# Patient Record
Sex: Female | Born: 1965 | Race: White | Hispanic: No | Marital: Married | Smoking: Never smoker
Health system: Southern US, Community
[De-identification: ages and names within clinical notes are randomized; demographics above are authoritative.]

## PROBLEM LIST (undated history)

## (undated) DIAGNOSIS — N939 Abnormal uterine and vaginal bleeding, unspecified: Secondary | ICD-10-CM

## (undated) DIAGNOSIS — E7800 Pure hypercholesterolemia, unspecified: Secondary | ICD-10-CM

## (undated) DIAGNOSIS — E1110 Type 2 diabetes mellitus with ketoacidosis without coma: Secondary | ICD-10-CM

## (undated) DIAGNOSIS — E669 Obesity, unspecified: Secondary | ICD-10-CM

## (undated) HISTORY — PX: OTHER SURGICAL HISTORY: SHX169

## (undated) HISTORY — DX: Abnormal uterine and vaginal bleeding, unspecified: N93.9

## (undated) HISTORY — DX: Obesity, unspecified: E66.9

## (undated) HISTORY — DX: Pure hypercholesterolemia, unspecified: E78.00

## (undated) HISTORY — DX: Type 2 diabetes mellitus with ketoacidosis without coma: E11.10

## (undated) HISTORY — PX: TYMPANOSTOMY TUBE PLACEMENT: SHX32

---

## 1998-05-04 ENCOUNTER — Inpatient Hospital Stay (HOSPITAL_COMMUNITY): Admission: AD | Admit: 1998-05-04 | Discharge: 1998-05-04 | Payer: Self-pay | Admitting: Obstetrics and Gynecology

## 1998-05-07 ENCOUNTER — Ambulatory Visit (HOSPITAL_COMMUNITY): Admission: RE | Admit: 1998-05-07 | Discharge: 1998-05-07 | Payer: Self-pay | Admitting: Obstetrics and Gynecology

## 1998-09-18 ENCOUNTER — Other Ambulatory Visit: Admission: RE | Admit: 1998-09-18 | Discharge: 1998-09-18 | Payer: Self-pay | Admitting: Obstetrics and Gynecology

## 2004-02-24 ENCOUNTER — Ambulatory Visit: Payer: Self-pay | Admitting: General Practice

## 2004-02-25 ENCOUNTER — Ambulatory Visit: Payer: Self-pay | Admitting: Unknown Physician Specialty

## 2004-03-03 ENCOUNTER — Ambulatory Visit: Payer: Self-pay | Admitting: Unknown Physician Specialty

## 2004-03-26 ENCOUNTER — Ambulatory Visit: Payer: Self-pay | Admitting: Unknown Physician Specialty

## 2004-08-23 ENCOUNTER — Ambulatory Visit: Payer: Self-pay | Admitting: Gynecology

## 2004-08-31 ENCOUNTER — Ambulatory Visit: Payer: Self-pay | Admitting: Gynecology

## 2004-09-23 ENCOUNTER — Ambulatory Visit (HOSPITAL_COMMUNITY): Admission: RE | Admit: 2004-09-23 | Discharge: 2004-09-23 | Payer: Self-pay | Admitting: Gynecology

## 2004-11-03 ENCOUNTER — Ambulatory Visit (HOSPITAL_COMMUNITY): Admission: RE | Admit: 2004-11-03 | Discharge: 2004-11-03 | Payer: Self-pay | Admitting: Gynecology

## 2005-01-17 ENCOUNTER — Ambulatory Visit (HOSPITAL_COMMUNITY): Admission: RE | Admit: 2005-01-17 | Discharge: 2005-01-17 | Payer: Self-pay | Admitting: Gynecology

## 2005-02-07 ENCOUNTER — Ambulatory Visit (HOSPITAL_COMMUNITY): Admission: RE | Admit: 2005-02-07 | Discharge: 2005-02-07 | Payer: Self-pay | Admitting: Gynecology

## 2005-03-05 ENCOUNTER — Encounter (INDEPENDENT_AMBULATORY_CARE_PROVIDER_SITE_OTHER): Payer: Self-pay | Admitting: Specialist

## 2005-03-05 ENCOUNTER — Inpatient Hospital Stay (HOSPITAL_COMMUNITY): Admission: AD | Admit: 2005-03-05 | Discharge: 2005-03-08 | Payer: Self-pay | Admitting: Gynecology

## 2005-03-09 ENCOUNTER — Encounter: Admission: RE | Admit: 2005-03-09 | Discharge: 2005-04-05 | Payer: Self-pay | Admitting: Gynecology

## 2005-04-18 ENCOUNTER — Ambulatory Visit: Payer: Self-pay | Admitting: Gynecology

## 2006-02-28 ENCOUNTER — Ambulatory Visit: Payer: Self-pay | Admitting: General Practice

## 2006-06-12 ENCOUNTER — Ambulatory Visit: Payer: Self-pay | Admitting: Gynecology

## 2006-06-12 ENCOUNTER — Encounter (INDEPENDENT_AMBULATORY_CARE_PROVIDER_SITE_OTHER): Payer: Self-pay | Admitting: Gynecology

## 2007-02-28 ENCOUNTER — Ambulatory Visit: Payer: Self-pay | Admitting: General Practice

## 2008-01-17 ENCOUNTER — Encounter: Payer: Self-pay | Admitting: Family Medicine

## 2008-01-17 ENCOUNTER — Ambulatory Visit: Payer: Self-pay | Admitting: Family Medicine

## 2008-03-18 ENCOUNTER — Ambulatory Visit: Payer: Self-pay | Admitting: General Practice

## 2008-09-16 ENCOUNTER — Ambulatory Visit: Payer: Self-pay | Admitting: General Practice

## 2009-03-04 ENCOUNTER — Ambulatory Visit: Payer: Self-pay | Admitting: Family Medicine

## 2009-03-23 ENCOUNTER — Ambulatory Visit: Payer: Self-pay | Admitting: General Practice

## 2009-10-16 ENCOUNTER — Ambulatory Visit: Payer: Self-pay | Admitting: General Practice

## 2009-11-07 ENCOUNTER — Emergency Department: Payer: Self-pay | Admitting: Emergency Medicine

## 2010-01-20 ENCOUNTER — Ambulatory Visit: Payer: Self-pay | Admitting: Family Medicine

## 2010-03-30 ENCOUNTER — Ambulatory Visit: Payer: Self-pay | Admitting: Unknown Physician Specialty

## 2010-06-15 NOTE — Assessment & Plan Note (Signed)
[name], Carr                ACCOUNT NO.:  [id number]   MEDICAL RECORD NO.:  [id number]          PATIENT TYPE:  POB   LOCATION:  [facility] at [facility]         FACILITY:  [facility]   PHYSICIAN:  Tinnie Gens, MD        DATE OF BIRTH:  [birth year]   DATE OF SERVICE:  03/04/2009                                  CLINIC NOTE   CHIEF COMPLAINT:  Yearly exam.   HISTORY OF PRESENT ILLNESS:  The patient is a 45 year old para 3 who is  a type 2 diabetic.  She was on insulin, Lantus and Humalog and has been  doing Weight Watchers for the past several weeks.  She has lost some  weight and has had a decrease in her insulin.  Otherwise, she is without  significant complaint today.  She uses Sprintec for birth control, this  worked well.  Periods are normal with normal flow.   PAST MEDICAL HISTORY:  Diabetes and obesity.   PAST SURGICAL HISTORY:  C-section x3 and tubes in her ears as a young  child.   MEDICATIONS:  She is on Lantus and Humalog, metformin 500 mg p.o. b.i.d.  and Ortho-Cyclen one p.o. daily.   ALLERGIES:  AUGMENTIN.   OBSTETRICAL HISTORY:  Para 3-0-3.  Previous C-sections.   GYNECOLOGIC HISTORY:  Negative.   FAMILY HISTORY:  Her mother deceased at age 11 of ovarian cancer, stage  IV.  Maternal grandmother has breast cancer, a maternal aunt recently  deceased of breast cancer which she battled for some 20 years.   SOCIAL HISTORY:  She works as an Print production planner for [employer].   REVIEW OF SYSTEMS:  Reviewed.  She denied chest pain, shortness of  breath, fever, chills, nausea, vomiting, diarrhea, constipation, trouble  passing her urine, trouble passing her stools.  She denies blood in her  stool.  She does not do regular breast exams but has not noted any  masses there.   PHYSICAL EXAMINATION:  VITAL SIGNS:  As noted in the chart.  Weight is  132 down at 9 pounds from last visit, pulse is 68, blood pressure  119/85.  GENERAL:  She is a mildly obese  female, in no acute distress.  HEENT:  Normocephalic, atraumatic.  Sclerae anicteric.  NECK:  Supple.  Normal thyroid.  LUNGS:  Clear bilaterally.  CV:  Regular rate and rhythm.  No rubs, gallops, or murmurs.  ABDOMEN:  Soft, nontender, nondistended.  EXTREMITIES:  No cyanosis, clubbing, or edema.  BREASTS:  Symmetric with everted nipples.  No masses.  No  supraclavicular or axillary adenopathy.  GU:  Normal external female genitalia.  BUS normal.  Vagina is pink and  rugated.  Cervix is parous without lesion.  Uterus is small, anteverted.  No adnexal mass or tenderness.   IMPRESSION:  1. Yearly exam.  2. Diabetes.  3. Obesity, improving.  4. A strong family history of breast and ovarian cancer.   PLAN:  1. We will attempt BRCA testing.  2. Pap smear today.  3. Mammogram scheduled next week, get her worked.  4. I have advised exercise and bedtime snacks to prevent low sugars  and exercise to improve weight loss.           ______________________________  Tinnie Gens, MD     TP/MEDQ  D:  03/04/2009  T:  03/05/2009  Job:  [id number]

## 2010-06-15 NOTE — Assessment & Plan Note (Signed)
Lori Carr, Lori Carr                ACCOUNT NO.:  [id number]   MEDICAL RECORD NO.:  [id number]          PATIENT TYPE:  POB   LOCATION:  [facility] at [facility]         FACILITY:  [facility]   PHYSICIAN:  Tinnie Gens, MD        DATE OF BIRTH:  1965-06-08   DATE OF SERVICE:                                  CLINIC NOTE   CHIEF COMPLAINT:  Yearly exam.   HISTORY OF PRESENT ILLNESS:  The patient is a 45 year old para 3 who  comes in today for yearly exam.  She is a type 2 diabetic and has been  since the delivery of her last baby.  She has been unable to get off  insulin since that time, sees Dr. Hyacinth Meeker for this problem who checks her  cholesterol and eye exams, and monitors her blood sugar.  She is on oral  contraceptives for birth control specifically Sprintec which works well  for her.  Her periods are regular and are of low flow.   PAST MEDICAL HISTORY:  Diabetes, obesity.   PAST SURGICAL HISTORY:  C-section x3.  She had tubes in her ears as a  young child.   MEDICATIONS:  She is on insulin subcu, metformin 500 mg b.i.d., Ortho-  Cyclen one p.o. daily.   ALLERGIES:  AUGMENTIN.   OBSTETRICAL HISTORY:  She is a para 3, with 3 previous C-sections.   GYNECOLOGIC HISTORY:  No significant GYN history is noted.   FAMILY HISTORY:  Strong family history of cancer in her mother's side of  the family.  Her mother was actually deceased from ovarian cancer, stage  IV.  She does have strong family history of breast cancer and some other  things.   SOCIAL HISTORY:  She works as an Print production planner.   REVIEW OF SYSTEMS:  Reviewed.  She denies chest pain, shortness of  breath, dysuria, headache, fever, chills, nausea, vomiting, diarrhea,  constipation.  She does have an itchy bottom from time to time, which  she attributes to hemorrhoids.  She denies any breast complaints or  pelvic complaints.   PHYSICAL EXAMINATION:  VITAL SIGNS:  Today, her vitals are as noted in  the chart.  GENERAL:  She is a  well-developed, well-nourished female in no acute  distress.  HEENT:  Normocephalic, atraumatic.  Sclerae anicteric.  NECK:  Supple.  Normal thyroid.  LUNGS:  Clear bilaterally.  CARDIOVASCULAR:  Regular rate and rhythm.  No rubs, gallops, or murmurs.  ABDOMEN:  Soft, nontender, nondistended.  EXTREMITIES:  No cyanosis, clubbing, or edema.  BREASTS:  Symmetric with everted nipples.  No masses.  No  supraclavicular or axillary adenopathy noted.  GENITOURINARY:  Normal external female genitalia.  BUS normal.  Vagina  is pink and rugated.  Cervix is nulliparous without lesion.  Uterus is  small, anteverted.  No adnexal mass or tenderness.   IMPRESSION:  1. GYN exam with Pap.  2. Strong family history of breast and ovarian cancer.   PLAN:  1. Pap smear today.  2. Yearly labs to be done at her PCP office.  3. We will mail the patient information about the RCA testing, which  she will look over and decide if would like to be tested for this.           ______________________________  Tinnie Gens, MD     TP/MEDQ  D:  01/17/2008  T:  01/18/2008  Job:  [id number]

## 2010-06-18 NOTE — Op Note (Signed)
NAMEKATHERIN, Lori Carr                ACCOUNT NO.:  [id number]   MEDICAL RECORD NO.:  [id number]          PATIENT TYPE:  INP   LOCATION:  9319                          FACILITY:  [facility]   PHYSICIAN:  Ginger Carne, MD  DATE OF BIRTH:  07-Aug-1965   DATE OF PROCEDURE:  03/05/2005  DATE OF DISCHARGE:                                 OPERATIVE REPORT   PREOPERATIVE DIAGNOSES:  1.  Type 1 insulin-dependent diabetes mellitus at 35-6/7 weeks' gestation.  2.  Repeat cesarean section in active labor with preterm premature rupture      of membranes.   POSTOPERATIVE DIAGNOSES:  1.  Type 1 insulin-dependent diabetes mellitus at 35-6/7 weeks' gestation.  2.  Repeat cesarean section in active labor with preterm premature rupture      of membranes.  3.  Preterm viable delivery of female infant.   PROCEDURE:  Repeat low transverse cesarean section.   SURGEON:  Dr. Mia Creek   ASSISTANT:  None.   ANESTHESIA:  Spinal.   COMPLICATIONS:  None immediate.   ESTIMATED BLOOD LOSS:  650 mL.   SPECIMEN:  Cord bloods and placenta.   OPERATIVE FINDINGS:  Preterm infant female delivered in the early morning of  March 05, 2005, Apgars and weight per delivery room record, no gross  abnormalities, baby cried spontaneously at delivery.  Copious amniotic fluid  was present, clear and compatible with preoperative assessment of  polyhydramnios.  The fetus in a vertex presentation, three-vessel cord,  central insertion, placenta complete.  Uterus demonstrated adhesive disease,  both laterally as well as superiorly on the anterior surface.  Uterus,  tubes, and ovaries showed normal decidual changes of pregnancy.   OPERATIVE PROCEDURE:  The patient prepped in the usual fashion and placed in  the left lateral supine position.  Betadine solution used for antiseptic,  and the patient was catheterized prior to the procedure.  After adequate  spinal analgesia, a Pfannenstiel incision was made and the abdomen  opened.  Lower uterine segment incised transversely, baby delivered, the cord clamped  and cut, and infant given to the pediatric staff after bulb suctioning.  Placenta removed manually, uterus inspected, closed the uterine musculature  in 1 layer with 0 Vicryl running interlocking suture.  Bleeding points  hemostatically checked, the blood clots removed from the abdomen.  Closure  of the parietal  peritoneum was not performed due to insufficient tissue.  The fascia was  closed with 0 Vicryl running interlocking suture from either end to midline  and 4-0 Prolene for a subcuticular closure with a pressure dressing.  The  patient tolerated the procedure well, returned to postanesthesia recovery  room in excellent condition.      Ginger Carne, MD  Electronically Signed     SHB/MEDQ  D:  03/05/2005  T:  03/05/2005  Job:  [id number]

## 2010-06-18 NOTE — Discharge Summary (Signed)
[name], Lori Carr                ACCOUNT NO.:  [id number]   MEDICAL RECORD NO.:  [id number]          PATIENT TYPE:  INP   LOCATION:  9319                          FACILITY:  [facility]   PHYSICIAN:  Ginger Carne, MD  DATE OF BIRTH:  11/12/1965   DATE OF ADMISSION:  03/05/2005  DATE OF DISCHARGE:  03/08/2005                                 DISCHARGE SUMMARY   REASON FOR HOSPITALIZATION:  Type 1 insulin dependent diabetes mellitus at  35-6/7 weeks with history of repeat cesarean section presenting in active  labor and preterm premature rupture of membranes.   IN-HOSPITAL PROCEDURE:  Repeat low transverse cesarean section.   FINAL DIAGNOSIS:  1.  Type 1 insulin dependent diabetes mellitus at 35-6/7 weeks with history      of repeat cesarean section presenting in active labor and preterm      premature rupture of membranes.  2.  Preterm viable delivery of female infant.   HOSPITAL COURSE:  The patient is a 45 year old multiparous Caucasian female  with type 1 diabetes mellitus presenting at 35-6/7 weeks' gestation with  preterm premature rupture of membranes in early morning of March 05, 2005.  The patient had a history of polyhydramnios prior to her admission.  Her  antenatal course was otherwise unremarkable.  She had been on three time a  day dose of insulin.  The patient underwent primary cesarean section on  March 05, 2005.  Intraoperative course was unremarkable.  Postoperatively  she did well. She was afebrile, voided well.  Incisions dry.  Calves without  tenderness.  Abdomen was soft. Postoperative H&H was 31.6 and 10.9.  The  infant weighed 3630 grams (8 pounds 0 ounces) with Apgars of 8 and 9.   The patient was discharged with routine postoperative instructions including  contacting the office for temperature elevation above 100.4 degrees  Fahrenheit, increasing incisional pain, drainage, abdominal pain, increased  vaginal bleeding, genitourinary or gastrointestinal  symptoms.  The patient  was advised to decrease her insulin by 1/4 both the long-acting and for  short-acting doses.  The patient's blood sugars have decreased over her  hospitalization stay.  She was prescribed ibuprofen 600 mg one up to four  times a day.  She declined narcotic analgesics.  She will be seen in three  days to have her subcuticular suture removal and then in 4-6 weeks for a  postoperative visit.      Ginger Carne, MD  Electronically Signed     SHB/MEDQ  D:  03/08/2005  T:  03/08/2005  Job:  [id number]

## 2010-09-06 ENCOUNTER — Encounter: Payer: Self-pay | Admitting: *Deleted

## 2010-09-06 DIAGNOSIS — E119 Type 2 diabetes mellitus without complications: Secondary | ICD-10-CM | POA: Insufficient documentation

## 2010-09-07 ENCOUNTER — Ambulatory Visit: Payer: Self-pay | Admitting: Obstetrics and Gynecology

## 2010-09-08 ENCOUNTER — Other Ambulatory Visit: Payer: Self-pay | Admitting: *Deleted

## 2010-09-08 DIAGNOSIS — Z309 Encounter for contraceptive management, unspecified: Secondary | ICD-10-CM

## 2010-09-08 MED ORDER — NORGESTIMATE-ETH ESTRADIOL 0.25-35 MG-MCG PO TABS: 1.0000 | ORAL_TABLET | Freq: Every day | ORAL | Status: DC

## 2010-09-08 NOTE — Telephone Encounter (Signed)
Patient has appointment in two weeks but will run out of her ocp before her appointment.  One refill authorized.

## 2010-09-21 ENCOUNTER — Ambulatory Visit (INDEPENDENT_AMBULATORY_CARE_PROVIDER_SITE_OTHER): Payer: BC Managed Care – PPO | Admitting: Obstetrics & Gynecology

## 2010-09-21 ENCOUNTER — Encounter: Payer: Self-pay | Admitting: Obstetrics & Gynecology

## 2010-09-21 VITALS — BP 118/86 | HR 83 | Ht 62.0 in | Wt 184.0 lb

## 2010-09-21 DIAGNOSIS — Z309 Encounter for contraceptive management, unspecified: Secondary | ICD-10-CM

## 2010-09-21 DIAGNOSIS — Z1272 Encounter for screening for malignant neoplasm of vagina: Secondary | ICD-10-CM

## 2010-09-21 DIAGNOSIS — Z01419 Encounter for gynecological examination (general) (routine) without abnormal findings: Secondary | ICD-10-CM

## 2010-09-21 MED ORDER — NORGESTIMATE-ETH ESTRADIOL 0.25-35 MG-MCG PO TABS: 1.0000 | ORAL_TABLET | Freq: Every day | ORAL | Status: DC

## 2010-09-21 NOTE — Progress Notes (Signed)
Yearly exam/ patient is concerned about her increasing weight.

## 2010-09-21 NOTE — Progress Notes (Signed)
  Subjective:     Lori Carr is a 45 y.o. female here for a routine exam.  Current complaints: increasing weight gain, no GYN concerns.     Gynecologic History Patient's last menstrual period was 09/14/2010. Contraception: OCP (estrogen/progesterone) Last Pap: 03/04/09. Results were: normal Last mammogram: 03/2010. Results were: normal  Obstetric History OB History    Grav Para Term Preterm Abortions TAB SAB Ect Mult Living   5 3 1 2 2 1 1  0 0 3     # Outc Date GA Lbr Len/2nd Wgt Sex Del Anes PTL Lv   1 TAB 1986           2 TRM 1990 [redacted]w[redacted]d  8lb8oz(3.856kg)  LTCS Spinal  Yes   3 SAB 2000           Comments: patient was 2-3 months along   4 PRE 2003 [redacted]w[redacted]d  6lb(2.722kg)  LTCS Spinal  Yes   Comments: prom   5 PRE 2007 [redacted]w[redacted]d  8lb(3.629kg)  LTCS Spinal  Yes       The following portions of the patient's history were reviewed and updated as appropriate: allergies, current medications, past family history, past medical history, past social history, past surgical history and problem list. Patient tested negative for BRCA1 and 2 in 12/2009  Review of Systems A comprehensive review of systems was negative.    Objective:    GENERAL: Well-developed, well-nourished female in no acute distress.  HEENT: Normocephalic, atraumatic. Sclerae anicteric.  NECK: Supple. Normal thyroid.  LUNGS: Clear to auscultation bilaterally.  HEART: Regular rate and rhythm. BREASTS: Symmetric with everted nipples. No masses, skin changes, nipple drainage,   lymphadenopathy. ABDOMEN: Soft, nontender, nondistended. No organomegaly. Well healed Pfannensteil incision. PELVIC: Normal external female genitalia. Vagina is pink and rugated.  Normal discharge. Normal cervix contour. Uterus is normal in size. No adnexal mass or tenderness. Pap smear obtained.  EXTREMITIES: No cyanosis, clubbing, or edema, 2+ distal pulses.     Assessment:    Healthy female exam.    Plan:    Contraception: OCP  (estrogen/progesterone). OCPs refilled.  Follow up as needed.    Gabrielle Mester A 09/21/2010 3:39 PM

## 2010-09-21 NOTE — Patient Instructions (Signed)

## 2010-11-25 ENCOUNTER — Telehealth: Payer: Self-pay | Admitting: *Deleted

## 2010-11-25 DIAGNOSIS — B373 Candidiasis of vulva and vagina: Secondary | ICD-10-CM

## 2010-11-25 NOTE — Telephone Encounter (Signed)
Needs something called in for her yeast infection to [facility].

## 2010-11-29 MED ORDER — FLUCONAZOLE 150 MG PO TABS: 150.0000 mg | ORAL_TABLET | Freq: Once | ORAL | Status: AC

## 2010-11-29 NOTE — Telephone Encounter (Signed)
Addended by: Barbara Cower on: 11/29/2010 04:37 PM   Modules accepted: Orders

## 2010-11-29 NOTE — Telephone Encounter (Signed)
Medication sent to pharmacy  

## 2011-03-11 ENCOUNTER — Ambulatory Visit: Payer: Self-pay | Admitting: General Practice

## 2011-04-05 ENCOUNTER — Ambulatory Visit: Payer: Self-pay

## 2011-10-19 ENCOUNTER — Telehealth: Payer: Self-pay | Admitting: *Deleted

## 2011-10-19 DIAGNOSIS — Z309 Encounter for contraceptive management, unspecified: Secondary | ICD-10-CM

## 2011-10-19 MED ORDER — NORGESTIMATE-ETH ESTRADIOL 0.25-35 MG-MCG PO TABS: 1.0000 | ORAL_TABLET | Freq: Every day | ORAL | Status: DC

## 2011-10-19 NOTE — Telephone Encounter (Signed)
Needs appt and refill of ocp.

## 2011-11-04 ENCOUNTER — Encounter: Payer: Self-pay | Admitting: Family Medicine

## 2011-11-04 ENCOUNTER — Ambulatory Visit (INDEPENDENT_AMBULATORY_CARE_PROVIDER_SITE_OTHER): Payer: BC Managed Care – PPO | Admitting: Family Medicine

## 2011-11-04 VITALS — BP 124/82 | HR 64 | Ht 62.0 in | Wt 171.0 lb

## 2011-11-04 DIAGNOSIS — Z1151 Encounter for screening for human papillomavirus (HPV): Secondary | ICD-10-CM

## 2011-11-04 DIAGNOSIS — Z01419 Encounter for gynecological examination (general) (routine) without abnormal findings: Secondary | ICD-10-CM

## 2011-11-04 DIAGNOSIS — Z309 Encounter for contraceptive management, unspecified: Secondary | ICD-10-CM

## 2011-11-04 DIAGNOSIS — Z124 Encounter for screening for malignant neoplasm of cervix: Secondary | ICD-10-CM

## 2011-11-04 MED ORDER — NORGESTIMATE-ETH ESTRADIOL 0.25-35 MG-MCG PO TABS: 1.0000 | ORAL_TABLET | Freq: Every day | ORAL | Status: DC

## 2011-11-04 NOTE — Patient Instructions (Signed)
Preventive Care for Adults, Female A healthy lifestyle and preventive care can promote health and wellness. Preventive health guidelines for women include the following key practices.  A routine yearly physical is a good way to check with your caregiver about your health and preventive screening. It is a chance to share any concerns and updates on your health, and to receive a thorough exam.  Visit your dentist for a routine exam and preventive care every 6 months. Brush your teeth twice a day and floss once a day. Good oral hygiene prevents tooth decay and gum disease.  The frequency of eye exams is based on your age, health, family medical history, use of contact lenses, and other factors. Follow your caregiver's recommendations for frequency of eye exams.  Eat a healthy diet. Foods like vegetables, fruits, whole grains, low-fat dairy products, and lean protein foods contain the nutrients you need without too many calories. Decrease your intake of foods high in solid fats, added sugars, and salt. Eat the right amount of calories for you.Get information about a proper diet from your caregiver, if necessary.  Regular physical exercise is one of the most important things you can do for your health. Most adults should get at least 150 minutes of moderate-intensity exercise (any activity that increases your heart rate and causes you to sweat) each week. In addition, most adults need muscle-strengthening exercises on 2 or more days a week.  Maintain a healthy weight. The body mass index (BMI) is a screening tool to identify possible weight problems. It provides an estimate of body fat based on height and weight. Your caregiver can help determine your BMI, and can help you achieve or maintain a healthy weight.For adults 20 years and older:  A BMI below 18.5 is considered underweight.  A BMI of 18.5 to 24.9 is normal.  A BMI of 25 to 29.9 is considered overweight.  A BMI of 30 and above is  considered obese.  Maintain normal blood lipids and cholesterol levels by exercising and minimizing your intake of saturated fat. Eat a balanced diet with plenty of fruit and vegetables. Blood tests for lipids and cholesterol should begin at age 20 and be repeated every 5 years. If your lipid or cholesterol levels are high, you are over 50, or you are at high risk for heart disease, you may need your cholesterol levels checked more frequently.Ongoing high lipid and cholesterol levels should be treated with medicines if diet and exercise are not effective.  If you smoke, find out from your caregiver how to quit. If you do not use tobacco, do not start.  If you are pregnant, do not drink alcohol. If you are breastfeeding, be very cautious about drinking alcohol. If you are not pregnant and choose to drink alcohol, do not exceed 1 drink per day. One drink is considered to be 12 ounces (355 mL) of beer, 5 ounces (148 mL) of wine, or 1.5 ounces (44 mL) of liquor.  Avoid use of street drugs. Do not share needles with anyone. Ask for help if you need support or instructions about stopping the use of drugs.  High blood pressure causes heart disease and increases the risk of stroke. Your blood pressure should be checked at least every 1 to 2 years. Ongoing high blood pressure should be treated with medicines if weight loss and exercise are not effective.  If you are 55 to 45 years old, ask your caregiver if you should take aspirin to prevent strokes.  Diabetes   screening involves taking a blood sample to check your fasting blood sugar level. This should be done once every 3 years, after age 45, if you are within normal weight and without risk factors for diabetes. Testing should be considered at a younger age or be carried out more frequently if you are overweight and have at least 1 risk factor for diabetes.  Breast cancer screening is essential preventive care for women. You should practice "breast  self-awareness." This means understanding the normal appearance and feel of your breasts and may include breast self-examination. Any changes detected, no matter how small, should be reported to a caregiver. Women in their 20s and 30s should have a clinical breast exam (CBE) by a caregiver as part of a regular health exam every 1 to 3 years. After age 40, women should have a CBE every year. Starting at age 40, women should consider having a mammography (breast X-ray test) every year. Women who have a family history of breast cancer should talk to their caregiver about genetic screening. Women at a high risk of breast cancer should talk to their caregivers about having magnetic resonance imaging (MRI) and a mammography every year.  The Pap test is a screening test for cervical cancer. A Pap test can show cell changes on the cervix that might become cervical cancer if left untreated. A Pap test is a procedure in which cells are obtained and examined from the lower end of the uterus (cervix).  Women should have a Pap test starting at age 21.  Between ages 21 and 29, Pap tests should be repeated every 2 years.  Beginning at age 30, you should have a Pap test every 3 years as long as the past 3 Pap tests have been normal.  Some women have medical problems that increase the chance of getting cervical cancer. Talk to your caregiver about these problems. It is especially important to talk to your caregiver if a new problem develops soon after your last Pap test. In these cases, your caregiver may recommend more frequent screening and Pap tests.  The above recommendations are the same for women who have or have not gotten the vaccine for human papillomavirus (HPV).  If you had a hysterectomy for a problem that was not cancer or a condition that could lead to cancer, then you no longer need Pap tests. Even if you no longer need a Pap test, a regular exam is a good idea to make sure no other problems are  starting.  If you are between ages 65 and 70, and you have had normal Pap tests going back 10 years, you no longer need Pap tests. Even if you no longer need a Pap test, a regular exam is a good idea to make sure no other problems are starting.  If you have had past treatment for cervical cancer or a condition that could lead to cancer, you need Pap tests and screening for cancer for at least 20 years after your treatment.  If Pap tests have been discontinued, risk factors (such as a new sexual partner) need to be reassessed to determine if screening should be resumed.  The HPV test is an additional test that may be used for cervical cancer screening. The HPV test looks for the virus that can cause the cell changes on the cervix. The cells collected during the Pap test can be tested for HPV. The HPV test could be used to screen women aged 30 years and older, and should   be used in women of any age who have unclear Pap test results. After the age of 30, women should have HPV testing at the same frequency as a Pap test.  Colorectal cancer can be detected and often prevented. Most routine colorectal cancer screening begins at the age of 50 and continues through age 75. However, your caregiver may recommend screening at an earlier age if you have risk factors for colon cancer. On a yearly basis, your caregiver may provide home test kits to check for hidden blood in the stool. Use of a small camera at the end of a tube, to directly examine the colon (sigmoidoscopy or colonoscopy), can detect the earliest forms of colorectal cancer. Talk to your caregiver about this at age 50, when routine screening begins. Direct examination of the colon should be repeated every 5 to 10 years through age 75, unless early forms of pre-cancerous polyps or small growths are found.  Hepatitis C blood testing is recommended for all people born from 1945 through 1965 and any individual with known risks for hepatitis C.  Practice  safe sex. Use condoms and avoid high-risk sexual practices to reduce the spread of sexually transmitted infections (STIs). STIs include gonorrhea, chlamydia, syphilis, trichomonas, herpes, HPV, and human immunodeficiency virus (HIV). Herpes, HIV, and HPV are viral illnesses that have no cure. They can result in disability, cancer, and death. Sexually active women aged 25 and younger should be checked for chlamydia. Older women with new or multiple partners should also be tested for chlamydia. Testing for other STIs is recommended if you are sexually active and at increased risk.  Osteoporosis is a disease in which the bones lose minerals and strength with aging. This can result in serious bone fractures. The risk of osteoporosis can be identified using a bone density scan. Women ages 65 and over and women at risk for fractures or osteoporosis should discuss screening with their caregivers. Ask your caregiver whether you should take a calcium supplement or vitamin D to reduce the rate of osteoporosis.  Menopause can be associated with physical symptoms and risks. Hormone replacement therapy is available to decrease symptoms and risks. You should talk to your caregiver about whether hormone replacement therapy is right for you.  Use sunscreen with sun protection factor (SPF) of 30 or more. Apply sunscreen liberally and repeatedly throughout the day. You should seek shade when your shadow is shorter than you. Protect yourself by wearing long sleeves, pants, a wide-brimmed hat, and sunglasses year round, whenever you are outdoors.  Once a month, do a whole body skin exam, using a mirror to look at the skin on your back. Notify your caregiver of new moles, moles that have irregular borders, moles that are larger than a pencil eraser, or moles that have changed in shape or color.  Stay current with required immunizations.  Influenza. You need a dose every fall (or winter). The composition of the flu vaccine  changes each year, so being vaccinated once is not enough.  Pneumococcal polysaccharide. You need 1 to 2 doses if you smoke cigarettes or if you have certain chronic medical conditions. You need 1 dose at age 65 (or older) if you have never been vaccinated.  Tetanus, diphtheria, pertussis (Tdap, Td). Get 1 dose of Tdap vaccine if you are younger than age 65, are over 65 and have contact with an infant, are a healthcare worker, are pregnant, or simply want to be protected from whooping cough. After that, you need a Td   booster dose every 10 years. Consult your caregiver if you have not had at least 3 tetanus and diphtheria-containing shots sometime in your life or have a deep or dirty wound.  HPV. You need this vaccine if you are a woman age 26 or younger. The vaccine is given in 3 doses over 6 months.  Measles, mumps, rubella (MMR). You need at least 1 dose of MMR if you were born in 1957 or later. You may also need a second dose.  Meningococcal. If you are age 19 to 21 and a first-year college student living in a residence hall, or have one of several medical conditions, you need to get vaccinated against meningococcal disease. You may also need additional booster doses.  Zoster (shingles). If you are age 60 or older, you should get this vaccine.  Varicella (chickenpox). If you have never had chickenpox or you were vaccinated but received only 1 dose, talk to your caregiver to find out if you need this vaccine.  Hepatitis A. You need this vaccine if you have a specific risk factor for hepatitis A virus infection or you simply wish to be protected from this disease. The vaccine is usually given as 2 doses, 6 to 18 months apart.  Hepatitis B. You need this vaccine if you have a specific risk factor for hepatitis B virus infection or you simply wish to be protected from this disease. The vaccine is given in 3 doses, usually over 6 months. Preventive Services / Frequency Ages 19 to 39  Blood  pressure check.** / Every 1 to 2 years.  Lipid and cholesterol check.** / Every 5 years beginning at age 20.  Clinical breast exam.** / Every 3 years for women in their 20s and 30s.  Pap test.** / Every 2 years from ages 21 through 29. Every 3 years starting at age 30 through age 65 or 70 with a history of 3 consecutive normal Pap tests.  HPV screening.** / Every 3 years from ages 30 through ages 65 to 70 with a history of 3 consecutive normal Pap tests.  Hepatitis C blood test.** / For any individual with known risks for hepatitis C.  Skin self-exam. / Monthly.  Influenza immunization.** / Every year.  Pneumococcal polysaccharide immunization.** / 1 to 2 doses if you smoke cigarettes or if you have certain chronic medical conditions.  Tetanus, diphtheria, pertussis (Tdap, Td) immunization. / A one-time dose of Tdap vaccine. After that, you need a Td booster dose every 10 years.  HPV immunization. / 3 doses over 6 months, if you are 26 and younger.  Measles, mumps, rubella (MMR) immunization. / You need at least 1 dose of MMR if you were born in 1957 or later. You may also need a second dose.  Meningococcal immunization. / 1 dose if you are age 19 to 21 and a first-year college student living in a residence hall, or have one of several medical conditions, you need to get vaccinated against meningococcal disease. You may also need additional booster doses.  Varicella immunization.** / Consult your caregiver.  Hepatitis A immunization.** / Consult your caregiver. 2 doses, 6 to 18 months apart.  Hepatitis B immunization.** / Consult your caregiver. 3 doses usually over 6 months. Ages 40 to 64  Blood pressure check.** / Every 1 to 2 years.  Lipid and cholesterol check.** / Every 5 years beginning at age 20.  Clinical breast exam.** / Every year after age 40.  Mammogram.** / Every year beginning at age 40   and continuing for as long as you are in good health. Consult with your  caregiver.  Pap test.** / Every 3 years starting at age 30 through age 65 or 70 with a history of 3 consecutive normal Pap tests.  HPV screening.** / Every 3 years from ages 30 through ages 65 to 70 with a history of 3 consecutive normal Pap tests.  Fecal occult blood test (FOBT) of stool. / Every year beginning at age 50 and continuing until age 75. You may not need to do this test if you get a colonoscopy every 10 years.  Flexible sigmoidoscopy or colonoscopy.** / Every 5 years for a flexible sigmoidoscopy or every 10 years for a colonoscopy beginning at age 50 and continuing until age 75.  Hepatitis C blood test.** / For all people born from 1945 through 1965 and any individual with known risks for hepatitis C.  Skin self-exam. / Monthly.  Influenza immunization.** / Every year.  Pneumococcal polysaccharide immunization.** / 1 to 2 doses if you smoke cigarettes or if you have certain chronic medical conditions.  Tetanus, diphtheria, pertussis (Tdap, Td) immunization.** / A one-time dose of Tdap vaccine. After that, you need a Td booster dose every 10 years.  Measles, mumps, rubella (MMR) immunization. / You need at least 1 dose of MMR if you were born in 1957 or later. You may also need a second dose.  Varicella immunization.** / Consult your caregiver.  Meningococcal immunization.** / Consult your caregiver.  Hepatitis A immunization.** / Consult your caregiver. 2 doses, 6 to 18 months apart.  Hepatitis B immunization.** / Consult your caregiver. 3 doses, usually over 6 months. Ages 65 and over  Blood pressure check.** / Every 1 to 2 years.  Lipid and cholesterol check.** / Every 5 years beginning at age 20.  Clinical breast exam.** / Every year after age 40.  Mammogram.** / Every year beginning at age 40 and continuing for as long as you are in good health. Consult with your caregiver.  Pap test.** / Every 3 years starting at age 30 through age 65 or 70 with a 3  consecutive normal Pap tests. Testing can be stopped between 65 and 70 with 3 consecutive normal Pap tests and no abnormal Pap or HPV tests in the past 10 years.  HPV screening.** / Every 3 years from ages 30 through ages 65 or 70 with a history of 3 consecutive normal Pap tests. Testing can be stopped between 65 and 70 with 3 consecutive normal Pap tests and no abnormal Pap or HPV tests in the past 10 years.  Fecal occult blood test (FOBT) of stool. / Every year beginning at age 50 and continuing until age 75. You may not need to do this test if you get a colonoscopy every 10 years.  Flexible sigmoidoscopy or colonoscopy.** / Every 5 years for a flexible sigmoidoscopy or every 10 years for a colonoscopy beginning at age 50 and continuing until age 75.  Hepatitis C blood test.** / For all people born from 1945 through 1965 and any individual with known risks for hepatitis C.  Osteoporosis screening.** / A one-time screening for women ages 65 and over and women at risk for fractures or osteoporosis.  Skin self-exam. / Monthly.  Influenza immunization.** / Every year.  Pneumococcal polysaccharide immunization.** / 1 dose at age 65 (or older) if you have never been vaccinated.  Tetanus, diphtheria, pertussis (Tdap, Td) immunization. / A one-time dose of Tdap vaccine if you are over   65 and have contact with an infant, are a healthcare worker, or simply want to be protected from whooping cough. After that, you need a Td booster dose every 10 years.  Varicella immunization.** / Consult your caregiver.  Meningococcal immunization.** / Consult your caregiver.  Hepatitis A immunization.** / Consult your caregiver. 2 doses, 6 to 18 months apart.  Hepatitis B immunization.** / Check with your caregiver. 3 doses, usually over 6 months. ** Family history and personal history of risk and conditions may change your caregiver's recommendations. Document Released: 03/15/2001 Document Revised: 04/11/2011  Document Reviewed: 06/14/2010 [employer] Patient Information 2013 [employer].  

## 2011-11-04 NOTE — Progress Notes (Signed)
  Subjective:     Lori Carr is a 46 y.o. female and is here for a comprehensive physical exam. The patient reports no problems.  Lots of stress from her oldest child.  Work and home life are good.  Still on OC's, no issues.  Working on glycemic control.  Had mammogram with her job in Feb.  Reportedly nml.  History   Social History  . Marital Status: Married    Spouse Name: N/A    Number of Children: N/A  . Years of Education: N/A   Occupational History  . Supervisor, [employer]   Social History Main Topics  . Smoking status: Never Smoker   . Smokeless tobacco: Never Used  . Alcohol Use: No  . Drug Use: No  . Sexually Active: Yes -- Female partner(s)    Birth Control/ Protection: Pill   Other Topics Concern  . Not on file   Social History Narrative  . No narrative on file   Health Maintenance  Topic Date Due  . Tetanus/tdap  06/08/1985  . Influenza Vaccine  10/02/2011  . Pap Smear  09/20/2013    The following portions of the patient's history were reviewed and updated as appropriate: allergies, current medications, past family history, past medical history, past social history, past surgical history and problem list.  Review of Systems A comprehensive review of systems was negative.   Objective:    BP 124/82  Pulse 64  Ht 5\' 2"  (1.575 m)  Wt 171 lb (77.565 kg)  BMI 31.28 kg/m2  LMP 10/14/2011 General appearance: alert, cooperative and appears stated age Neck: no adenopathy, supple, symmetrical, trachea midline and thyroid not enlarged, symmetric, no tenderness/mass/nodules Lungs: clear to auscultation bilaterally Breasts: normal appearance, no masses or tenderness Heart: regular rate and rhythm, S1, S2 normal, no murmur, click, rub or gallop Abdomen: soft, non-tender; bowel sounds normal; no masses,  no organomegaly Pelvic: cervix normal in appearance, external genitalia normal, no adnexal masses or tenderness, no cervical motion tenderness,  uterus normal size, shape, and consistency and vagina normal without discharge Extremities: extremities normal, atraumatic, no cyanosis or edema Pulses: 2+ and symmetric Skin: Skin color, texture, turgor normal. No rashes or lesions Lymph nodes: Cervical, supraclavicular, and axillary nodes normal. Neurologic: Grossly normal    Assessment:    Healthy female exam.      Plan:    Pap smear today Labs per PCP. See After Visit Summary for Counseling Recommendations

## 2012-03-17 ENCOUNTER — Other Ambulatory Visit: Payer: Self-pay

## 2012-04-10 ENCOUNTER — Ambulatory Visit: Payer: Self-pay | Admitting: General Practice

## 2012-05-02 ENCOUNTER — Emergency Department: Payer: Self-pay | Admitting: Emergency Medicine

## 2012-08-17 ENCOUNTER — Telehealth: Payer: Self-pay

## 2012-08-17 NOTE — Telephone Encounter (Signed)
Patient called requesting medical records for specific visits to be emailed or faxed on 08/15/12. She wanted any visits from 2009-2012. I initially told patient we were not allowed to fax them over due to [facility] restrictions and patient confidentiality. E-mail was not an option. After further discussion and verifying that she was indeed the patient, I agreed to fax them after she re-asured me there was a secure fax I could send them too. I sent it to her attention on 08/16/12 since she called late on 08/15/12. Before our conversation was over I re- confirmed the secure fax and she agreed. Patient called back today very upset because she was not aware I was sending medical information and other people were able to see her information. This is after we discuss this in detail and agreed and requested her records to be sent to her at her work place. I was hesitant at first and told her we couldn't but she really insisted. I agreed just because she has been coming to [place] for a long time, I knew who she was and she confirmed a secure fax machine. I did apologies to patient for any confusion this had caused. And assured her this is why we never send records via fax. I told her I would never do this again for anyone.

## 2012-11-22 ENCOUNTER — Emergency Department: Payer: Self-pay | Admitting: Emergency Medicine

## 2012-12-06 ENCOUNTER — Other Ambulatory Visit: Payer: Self-pay

## 2012-12-10 ENCOUNTER — Other Ambulatory Visit (HOSPITAL_COMMUNITY)
Admission: RE | Admit: 2012-12-10 | Discharge: 2012-12-10 | Disposition: A | Discharge status: Home / Self Care | Payer: BC Managed Care – PPO | Source: Ambulatory Visit | Attending: Obstetrics & Gynecology | Admitting: Obstetrics & Gynecology

## 2012-12-10 ENCOUNTER — Encounter: Payer: Self-pay | Admitting: Obstetrics & Gynecology

## 2012-12-10 ENCOUNTER — Ambulatory Visit (INDEPENDENT_AMBULATORY_CARE_PROVIDER_SITE_OTHER): Payer: BC Managed Care – PPO | Admitting: Obstetrics & Gynecology

## 2012-12-10 VITALS — BP 114/82 | HR 81 | Ht 62.0 in | Wt 190.0 lb

## 2012-12-10 DIAGNOSIS — Z124 Encounter for screening for malignant neoplasm of cervix: Secondary | ICD-10-CM

## 2012-12-10 DIAGNOSIS — Z1151 Encounter for screening for human papillomavirus (HPV): Secondary | ICD-10-CM | POA: Insufficient documentation

## 2012-12-10 DIAGNOSIS — Z309 Encounter for contraceptive management, unspecified: Secondary | ICD-10-CM

## 2012-12-10 DIAGNOSIS — Z01419 Encounter for gynecological examination (general) (routine) without abnormal findings: Secondary | ICD-10-CM

## 2012-12-10 DIAGNOSIS — Z3041 Encounter for surveillance of contraceptive pills: Secondary | ICD-10-CM

## 2012-12-10 MED ORDER — NORGESTIMATE-ETH ESTRADIOL 0.25-35 MG-MCG PO TABS: 1.0000 | ORAL_TABLET | Freq: Every day | ORAL | Status: DC

## 2012-12-10 NOTE — Patient Instructions (Signed)
Preventive Care for Adults, Female A healthy lifestyle and preventive care can promote health and wellness. Preventive health guidelines for women include the following key practices.  A routine yearly physical is a good way to check with your caregiver about your health and preventive screening. It is a chance to share any concerns and updates on your health, and to receive a thorough exam.  Visit your dentist for a routine exam and preventive care every 6 months. Brush your teeth twice a day and floss once a day. Good oral hygiene prevents tooth decay and gum disease.  The frequency of eye exams is based on your age, health, family medical history, use of contact lenses, and other factors. Follow your caregiver's recommendations for frequency of eye exams.  Eat a healthy diet. Foods like vegetables, fruits, whole grains, low-fat dairy products, and lean protein foods contain the nutrients you need without too many calories. Decrease your intake of foods high in solid fats, added sugars, and salt. Eat the right amount of calories for you.Get information about a proper diet from your caregiver, if necessary.  Regular physical exercise is one of the most important things you can do for your health. Most adults should get at least 150 minutes of moderate-intensity exercise (any activity that increases your heart rate and causes you to sweat) each week. In addition, most adults need muscle-strengthening exercises on 2 or more days a week.  Maintain a healthy weight. The body mass index (BMI) is a screening tool to identify possible weight problems. It provides an estimate of body fat based on height and weight. Your caregiver can help determine your BMI, and can help you achieve or maintain a healthy weight.For adults 20 years and older:  A BMI below 18.5 is considered underweight.  A BMI of 18.5 to 24.9 is normal.  A BMI of 25 to 29.9 is considered overweight.  A BMI of 30 and above is  considered obese.  Maintain normal blood lipids and cholesterol levels by exercising and minimizing your intake of saturated fat. Eat a balanced diet with plenty of fruit and vegetables. Blood tests for lipids and cholesterol should begin at age 20 and be repeated every 5 years. If your lipid or cholesterol levels are high, you are over 50, or you are at high risk for heart disease, you may need your cholesterol levels checked more frequently.Ongoing high lipid and cholesterol levels should be treated with medicines if diet and exercise are not effective.  If you smoke, find out from your caregiver how to quit. If you do not use tobacco, do not start.  Lung cancer screening is recommended for adults aged 55 80 years who are at high risk for developing lung cancer because of a history of smoking. Yearly low-dose computed tomography (CT) is recommended for people who have at least a 30-pack-year history of smoking and are a current smoker or have quit within the past 15 years. A pack year of smoking is smoking an average of 1 pack of cigarettes a day for 1 year (for example: 1 pack a day for 30 years or 2 packs a day for 15 years). Yearly screening should continue until the smoker has stopped smoking for at least 15 years. Yearly screening should also be stopped for people who develop a health problem that would prevent them from having lung cancer treatment.  If you are pregnant, do not drink alcohol. If you are breastfeeding, be very cautious about drinking alcohol. If you are   not pregnant and choose to drink alcohol, do not exceed 1 drink per day. One drink is considered to be 12 ounces (355 mL) of beer, 5 ounces (148 mL) of wine, or 1.5 ounces (44 mL) of liquor.  Avoid use of street drugs. Do not share needles with anyone. Ask for help if you need support or instructions about stopping the use of drugs.  High blood pressure causes heart disease and increases the risk of stroke. Your blood pressure  should be checked at least every 1 to 2 years. Ongoing high blood pressure should be treated with medicines if weight loss and exercise are not effective.  If you are 55 to 46 years old, ask your caregiver if you should take aspirin to prevent strokes.  Diabetes screening involves taking a blood sample to check your fasting blood sugar level. This should be done once every 3 years, after age 45, if you are within normal weight and without risk factors for diabetes. Testing should be considered at a younger age or be carried out more frequently if you are overweight and have at least 1 risk factor for diabetes.  Breast cancer screening is essential preventive care for women. You should practice "breast self-awareness." This means understanding the normal appearance and feel of your breasts and may include breast self-examination. Any changes detected, no matter how small, should be reported to a caregiver. Women in their 20s and 30s should have a clinical breast exam (CBE) by a caregiver as part of a regular health exam every 1 to 3 years. After age 40, women should have a CBE every year. Starting at age 40, women should consider having a mammography (breast X-ray test) every year. Women who have a family history of breast cancer should talk to their caregiver about genetic screening. Women at a high risk of breast cancer should talk to their caregivers about having magnetic resonance imaging (MRI) and a mammography every year.  Breast cancer gene (BRCA)-related cancer risk assessment is recommended for women who have family members with BRCA-related cancers. BRCA-related cancers include breast, ovarian, tubal, and peritoneal cancers. Having family members with these cancers may be associated with an increased risk for harmful changes (mutations) in the breast cancer genes BRCA1 and BRCA2. Results of the assessment will determine the need for genetic counseling and BRCA1 and BRCA2 testing.  The Pap test is  a screening test for cervical cancer. A Pap test can show cell changes on the cervix that might become cervical cancer if left untreated. A Pap test is a procedure in which cells are obtained and examined from the lower end of the uterus (cervix).  Women should have a Pap test starting at age 21.  Between ages 21 and 29, Pap tests should be repeated every 2 years.  Beginning at age 30, you should have a Pap test every 3 years as long as the past 3 Pap tests have been normal.  Some women have medical problems that increase the chance of getting cervical cancer. Talk to your caregiver about these problems. It is especially important to talk to your caregiver if a new problem develops soon after your last Pap test. In these cases, your caregiver may recommend more frequent screening and Pap tests.  The above recommendations are the same for women who have or have not gotten the vaccine for human papillomavirus (HPV).  If you had a hysterectomy for a problem that was not cancer or a condition that could lead to cancer, then   you no longer need Pap tests. Even if you no longer need a Pap test, a regular exam is a good idea to make sure no other problems are starting.  If you are between ages 65 and 70, and you have had normal Pap tests going back 10 years, you no longer need Pap tests. Even if you no longer need a Pap test, a regular exam is a good idea to make sure no other problems are starting.  If you have had past treatment for cervical cancer or a condition that could lead to cancer, you need Pap tests and screening for cancer for at least 20 years after your treatment.  If Pap tests have been discontinued, risk factors (such as a new sexual partner) need to be reassessed to determine if screening should be resumed.  The HPV test is an additional test that may be used for cervical cancer screening. The HPV test looks for the virus that can cause the cell changes on the cervix. The cells collected  during the Pap test can be tested for HPV. The HPV test could be used to screen women aged 30 years and older, and should be used in women of any age who have unclear Pap test results. After the age of 30, women should have HPV testing at the same frequency as a Pap test.  Colorectal cancer can be detected and often prevented. Most routine colorectal cancer screening begins at the age of 50 and continues through age 75. However, your caregiver may recommend screening at an earlier age if you have risk factors for colon cancer. On a yearly basis, your caregiver may provide home test kits to check for hidden blood in the stool. Use of a small camera at the end of a tube, to directly examine the colon (sigmoidoscopy or colonoscopy), can detect the earliest forms of colorectal cancer. Talk to your caregiver about this at age 50, when routine screening begins. Direct examination of the colon should be repeated every 5 to 10 years through age 75, unless early forms of pre-cancerous polyps or small growths are found.  Hepatitis C blood testing is recommended for all people born from 1945 through 1965 and any individual with known risks for hepatitis C.  Practice safe sex. Use condoms and avoid high-risk sexual practices to reduce the spread of sexually transmitted infections (STIs). STIs include gonorrhea, chlamydia, syphilis, trichomonas, herpes, HPV, and human immunodeficiency virus (HIV). Herpes, HIV, and HPV are viral illnesses that have no cure. They can result in disability, cancer, and death. Sexually active women aged 25 and younger should be checked for chlamydia. Older women with new or multiple partners should also be tested for chlamydia. Testing for other STIs is recommended if you are sexually active and at increased risk.  Osteoporosis is a disease in which the bones lose minerals and strength with aging. This can result in serious bone fractures. The risk of osteoporosis can be identified using a  bone density scan. Women ages 65 and over and women at risk for fractures or osteoporosis should discuss screening with their caregivers. Ask your caregiver whether you should take a calcium supplement or vitamin D to reduce the rate of osteoporosis.  Menopause can be associated with physical symptoms and risks. Hormone replacement therapy is available to decrease symptoms and risks. You should talk to your caregiver about whether hormone replacement therapy is right for you.  Use sunscreen. Apply sunscreen liberally and repeatedly throughout the day. You should seek shade   when your shadow is shorter than you. Protect yourself by wearing long sleeves, pants, a wide-brimmed hat, and sunglasses year round, whenever you are outdoors.  Once a month, do a whole body skin exam, using a mirror to look at the skin on your back. Notify your caregiver of new moles, moles that have irregular borders, moles that are larger than a pencil eraser, or moles that have changed in shape or color.  Stay current with required immunizations.  Influenza vaccine. All adults should be immunized every year.  Tetanus, diphtheria, and acellular pertussis (Td, Tdap) vaccine. Pregnant women should receive 1 dose of Tdap vaccine during each pregnancy. The dose should be obtained regardless of the length of time since the last dose. Immunization is preferred during the 27th to 36th week of gestation. An adult who has not previously received Tdap or who does not know her vaccine status should receive 1 dose of Tdap. This initial dose should be followed by tetanus and diphtheria toxoids (Td) booster doses every 10 years. Adults with an unknown or incomplete history of completing a 3-dose immunization series with Td-containing vaccines should begin or complete a primary immunization series including a Tdap dose. Adults should receive a Td booster every 10 years.  Varicella vaccine. An adult without evidence of immunity to varicella  should receive 2 doses or a second dose if she has previously received 1 dose. Pregnant females who do not have evidence of immunity should receive the first dose after pregnancy. This first dose should be obtained before leaving the health care facility. The second dose should be obtained 4 8 weeks after the first dose.  Human papillomavirus (HPV) vaccine. Females aged 13 26 years who have not received the vaccine previously should obtain the 3-dose series. The vaccine is not recommended for use in pregnant females. However, pregnancy testing is not needed before receiving a dose. If a female is found to be pregnant after receiving a dose, no treatment is needed. In that case, the remaining doses should be delayed until after the pregnancy. Immunization is recommended for any person with an immunocompromised condition through the age of 26 years if she did not get any or all doses earlier. During the 3-dose series, the second dose should be obtained 4 8 weeks after the first dose. The third dose should be obtained 24 weeks after the first dose and 16 weeks after the second dose.  Zoster vaccine. One dose is recommended for adults aged 60 years or older unless certain conditions are present.  Measles, mumps, and rubella (MMR) vaccine. Adults born before 1957 generally are considered immune to measles and mumps. Adults born in 1957 or later should have 1 or more doses of MMR vaccine unless there is a contraindication to the vaccine or there is laboratory evidence of immunity to each of the three diseases. A routine second dose of MMR vaccine should be obtained at least 28 days after the first dose for students attending postsecondary schools, health care workers, or international travelers. People who received inactivated measles vaccine or an unknown type of measles vaccine during 1963 1967 should receive 2 doses of MMR vaccine. People who received inactivated mumps vaccine or an unknown type of mumps vaccine  before 1979 and are at high risk for mumps infection should consider immunization with 2 doses of MMR vaccine. For females of childbearing age, rubella immunity should be determined. If there is no evidence of immunity, females who are not pregnant should be vaccinated. If there   is no evidence of immunity, females who are pregnant should delay immunization until after pregnancy. Unvaccinated health care workers born before 1957 who lack laboratory evidence of measles, mumps, or rubella immunity or laboratory confirmation of disease should consider measles and mumps immunization with 2 doses of MMR vaccine or rubella immunization with 1 dose of MMR vaccine.  Pneumococcal 13-valent conjugate (PCV13) vaccine. When indicated, a person who is uncertain of her immunization history and has no record of immunization should receive the PCV13 vaccine. An adult aged 19 years or older who has certain medical conditions and has not been previously immunized should receive 1 dose of PCV13 vaccine. This PCV13 should be followed with a dose of pneumococcal polysaccharide (PPSV23) vaccine. The PPSV23 vaccine dose should be obtained at least 8 weeks after the dose of PCV13 vaccine. An adult aged 19 years or older who has certain medical conditions and previously received 1 or more doses of PPSV23 vaccine should receive 1 dose of PCV13. The PCV13 vaccine dose should be obtained 1 or more years after the last PPSV23 vaccine dose.  Pneumococcal polysaccharide (PPSV23) vaccine. When PCV13 is also indicated, PCV13 should be obtained first. All adults aged 65 years and older should be immunized. An adult younger than age 65 years who has certain medical conditions should be immunized. Any person who resides in a nursing home or long-term care facility should be immunized. An adult smoker should be immunized. People with an immunocompromised condition and certain other conditions should receive both PCV13 and PPSV23 vaccines. People  with human immunodeficiency virus (HIV) infection should be immunized as soon as possible after diagnosis. Immunization during chemotherapy or radiation therapy should be avoided. Routine use of PPSV23 vaccine is not recommended for American Indians, [place] Natives, or people younger than 65 years unless there are medical conditions that require PPSV23 vaccine. When indicated, people who have unknown immunization and have no record of immunization should receive PPSV23 vaccine. One-time revaccination 5 years after the first dose of PPSV23 is recommended for people aged 19 64 years who have chronic kidney failure, nephrotic syndrome, asplenia, or immunocompromised conditions. People who received 1 2 doses of PPSV23 before age 65 years should receive another dose of PPSV23 vaccine at age 65 years or later if at least 5 years have passed since the previous dose. Doses of PPSV23 are not needed for people immunized with PPSV23 at or after age 65 years.  Meningococcal vaccine. Adults with asplenia or persistent complement component deficiencies should receive 2 doses of quadrivalent meningococcal conjugate (MenACWY-D) vaccine. The doses should be obtained at least 2 months apart. Microbiologists working with certain meningococcal bacteria, military recruits, people at risk during an outbreak, and people who travel to or live in countries with a high rate of meningitis should be immunized. A first-year college student up through age 21 years who is living in a residence hall should receive a dose if she did not receive a dose on or after her 16th birthday. Adults who have certain high-risk conditions should receive one or more doses of vaccine.  Hepatitis A vaccine. Adults who wish to be protected from this disease, have certain high-risk conditions, work with hepatitis A-infected animals, work in hepatitis A research labs, or travel to or work in countries with a high rate of hepatitis A should be immunized. Adults  who were previously unvaccinated and who anticipate close contact with an international adoptee during the first 60 days after arrival in the [place] from a country   with a high rate of hepatitis A should be immunized.  Hepatitis B vaccine. Adults who wish to be protected from this disease, have certain high-risk conditions, may be exposed to blood or other infectious body fluids, are household contacts or sex partners of hepatitis B positive people, are clients or workers in certain care facilities, or travel to or work in countries with a high rate of hepatitis B should be immunized.  Haemophilus influenzae type b (Hib) vaccine. A previously unvaccinated person with asplenia or sickle cell disease or having a scheduled splenectomy should receive 1 dose of Hib vaccine. Regardless of previous immunization, a recipient of a hematopoietic stem cell transplant should receive a 3-dose series 6 12 months after her successful transplant. Hib vaccine is not recommended for adults with HIV infection. Preventive Services / Frequency Ages 19 to 39  Blood pressure check.** / Every 1 to 2 years.  Lipid and cholesterol check.** / Every 5 years beginning at age 20.  Clinical breast exam.** / Every 3 years for women in their 20s and 30s.  BRCA-related cancer risk assessment.** / For women who have family members with a BRCA-related cancer (breast, ovarian, tubal, or peritoneal cancers).  Pap test.** / Every 2 years from ages 21 through 29. Every 3 years starting at age 30 through age 65 or 70 with a history of 3 consecutive normal Pap tests.  HPV screening.** / Every 3 years from ages 30 through ages 65 to 70 with a history of 3 consecutive normal Pap tests.  Hepatitis C blood test.** / For any individual with known risks for hepatitis C.  Skin self-exam. / Monthly.  Influenza vaccine. / Every year.  Tetanus, diphtheria, and acellular pertussis (Tdap, Td) vaccine.** / Consult your caregiver. Pregnant  women should receive 1 dose of Tdap vaccine during each pregnancy. 1 dose of Td every 10 years.  Varicella vaccine.** / Consult your caregiver. Pregnant females who do not have evidence of immunity should receive the first dose after pregnancy.  HPV vaccine. / 3 doses over 6 months, if 26 and younger. The vaccine is not recommended for use in pregnant females. However, pregnancy testing is not needed before receiving a dose.  Measles, mumps, rubella (MMR) vaccine.** / You need at least 1 dose of MMR if you were born in 1957 or later. You may also need a 2nd dose. For females of childbearing age, rubella immunity should be determined. If there is no evidence of immunity, females who are not pregnant should be vaccinated. If there is no evidence of immunity, females who are pregnant should delay immunization until after pregnancy.  Pneumococcal 13-valent conjugate (PCV13) vaccine.** / Consult your caregiver.  Pneumococcal polysaccharide (PPSV23) vaccine.** / 1 to 2 doses if you smoke cigarettes or if you have certain conditions.  Meningococcal vaccine.** / 1 dose if you are age 19 to 21 years and a first-year college student living in a residence hall, or have one of several medical conditions, you need to get vaccinated against meningococcal disease. You may also need additional booster doses.  Hepatitis A vaccine.** / Consult your caregiver.  Hepatitis B vaccine.** / Consult your caregiver.  Haemophilus influenzae type b (Hib) vaccine.** / Consult your caregiver. Ages 40 to 64  Blood pressure check.** / Every 1 to 2 years.  Lipid and cholesterol check.** / Every 5 years beginning at age 20.  Lung cancer screening. / Every year if you are aged 55 80 years and have a 30-pack-year history of smoking and   currently smoke or have quit within the past 15 years. Yearly screening is stopped once you have quit smoking for at least 15 years or develop a health problem that would prevent you from having  lung cancer treatment.  Clinical breast exam.** / Every year after age 40.  BRCA-related cancer risk assessment.** / For women who have family members with a BRCA-related cancer (breast, ovarian, tubal, or peritoneal cancers).  Mammogram.** / Every year beginning at age 40 and continuing for as long as you are in good health. Consult with your caregiver.  Pap test.** / Every 3 years starting at age 30 through age 65 or 70 with a history of 3 consecutive normal Pap tests.  HPV screening.** / Every 3 years from ages 30 through ages 65 to 70 with a history of 3 consecutive normal Pap tests.  Fecal occult blood test (FOBT) of stool. / Every year beginning at age 50 and continuing until age 75. You may not need to do this test if you get a colonoscopy every 10 years.  Flexible sigmoidoscopy or colonoscopy.** / Every 5 years for a flexible sigmoidoscopy or every 10 years for a colonoscopy beginning at age 50 and continuing until age 75.  Hepatitis C blood test.** / For all people born from 1945 through 1965 and any individual with known risks for hepatitis C.  Skin self-exam. / Monthly.  Influenza vaccine. / Every year.  Tetanus, diphtheria, and acellular pertussis (Tdap/Td) vaccine.** / Consult your caregiver. Pregnant women should receive 1 dose of Tdap vaccine during each pregnancy. 1 dose of Td every 10 years.  Varicella vaccine.** / Consult your caregiver. Pregnant females who do not have evidence of immunity should receive the first dose after pregnancy.  Zoster vaccine.** / 1 dose for adults aged 60 years or older.  Measles, mumps, rubella (MMR) vaccine.** / You need at least 1 dose of MMR if you were born in 1957 or later. You may also need a 2nd dose. For females of childbearing age, rubella immunity should be determined. If there is no evidence of immunity, females who are not pregnant should be vaccinated. If there is no evidence of immunity, females who are pregnant should delay  immunization until after pregnancy.  Pneumococcal 13-valent conjugate (PCV13) vaccine.** / Consult your caregiver.  Pneumococcal polysaccharide (PPSV23) vaccine.** / 1 to 2 doses if you smoke cigarettes or if you have certain conditions.  Meningococcal vaccine.** / Consult your caregiver.  Hepatitis A vaccine.** / Consult your caregiver.  Hepatitis B vaccine.** / Consult your caregiver.  Haemophilus influenzae type b (Hib) vaccine.** / Consult your caregiver. Ages 65 and over  Blood pressure check.** / Every 1 to 2 years.  Lipid and cholesterol check.** / Every 5 years beginning at age 20.  Lung cancer screening. / Every year if you are aged 55 80 years and have a 30-pack-year history of smoking and currently smoke or have quit within the past 15 years. Yearly screening is stopped once you have quit smoking for at least 15 years or develop a health problem that would prevent you from having lung cancer treatment.  Clinical breast exam.** / Every year after age 40.  BRCA-related cancer risk assessment.** / For women who have family members with a BRCA-related cancer (breast, ovarian, tubal, or peritoneal cancers).  Mammogram.** / Every year beginning at age 40 and continuing for as long as you are in good health. Consult with your caregiver.  Pap test.** / Every 3 years starting at age   30 through age 65 or 70 with a 3 consecutive normal Pap tests. Testing can be stopped between 65 and 70 with 3 consecutive normal Pap tests and no abnormal Pap or HPV tests in the past 10 years.  HPV screening.** / Every 3 years from ages 30 through ages 65 or 70 with a history of 3 consecutive normal Pap tests. Testing can be stopped between 65 and 70 with 3 consecutive normal Pap tests and no abnormal Pap or HPV tests in the past 10 years.  Fecal occult blood test (FOBT) of stool. / Every year beginning at age 50 and continuing until age 75. You may not need to do this test if you get a colonoscopy  every 10 years.  Flexible sigmoidoscopy or colonoscopy.** / Every 5 years for a flexible sigmoidoscopy or every 10 years for a colonoscopy beginning at age 50 and continuing until age 75.  Hepatitis C blood test.** / For all people born from 1945 through 1965 and any individual with known risks for hepatitis C.  Osteoporosis screening.** / A one-time screening for women ages 65 and over and women at risk for fractures or osteoporosis.  Skin self-exam. / Monthly.  Influenza vaccine. / Every year.  Tetanus, diphtheria, and acellular pertussis (Tdap/Td) vaccine.** / 1 dose of Td every 10 years.  Varicella vaccine.** / Consult your caregiver.  Zoster vaccine.** / 1 dose for adults aged 60 years or older.  Pneumococcal 13-valent conjugate (PCV13) vaccine.** / Consult your caregiver.  Pneumococcal polysaccharide (PPSV23) vaccine.** / 1 dose for all adults aged 65 years and older.  Meningococcal vaccine.** / Consult your caregiver.  Hepatitis A vaccine.** / Consult your caregiver.  Hepatitis B vaccine.** / Consult your caregiver.  Haemophilus influenzae type b (Hib) vaccine.** / Consult your caregiver. ** Family history and personal history of risk and conditions may change your caregiver's recommendations. Document Released: 03/15/2001 Document Revised: 05/14/2012 Document Reviewed: 06/14/2010 [employer] Patient Information 2014 [employer].  

## 2012-12-10 NOTE — Progress Notes (Signed)
  Subjective:     Lori Carr is a 47 y.o. [phone] female and is here for a comprehensive physical exam. The patient reports no problems. Desires OCP refill.  History   Social History  . Marital Status: Married    Spouse Name: N/A    Number of Children: N/A  . Years of Education: N/A   Occupational History  . Supervisor, [employer]   Social History Main Topics  . Smoking status: Never Smoker   . Smokeless tobacco: Never Used  . Alcohol Use: No  . Drug Use: No  . Sexual Activity: Yes    Partners: Male    Birth Control/ Protection: Pill   Other Topics Concern  . Not on file   Social History Narrative  . No narrative on file   Health Maintenance  Topic Date Due  . Tetanus/tdap  06/08/1985  . Influenza Vaccine  08/31/2012  . Pap Smear  11/04/2014    The following portions of the patient's history were reviewed and updated as appropriate: allergies, current medications, past family history, past medical history, past social history, past surgical history and problem list.  Normal mammogram every February, offered at work.  Review of Systems A comprehensive review of systems was negative.   Objective:   BP 114/82  Pulse 81  Ht 5\' 2"  (1.575 m)  Wt 190 lb (86.183 kg)  BMI 34.74 kg/m2  LMP 12/02/2012 GENERAL: Well-developed, well-nourished female in no acute distress.  HEENT: Normocephalic, atraumatic. Sclerae anicteric.  NECK: Supple. Normal thyroid.  LUNGS: Clear to auscultation bilaterally.  HEART: Regular rate and rhythm. BREASTS: Symmetric in size. No masses, skin changes, nipple drainage, or lymphadenopathy. ABDOMEN: Soft, nontender, nondistended. No organomegaly. PELVIC: Normal external female genitalia. Vagina is pink and rugated.  Normal discharge. Normal cervix contour. Pap smear obtained. Uterus is normal in size. No adnexal mass or tenderness.  EXTREMITIES: No cyanosis, clubbing, or edema, 2+ distal pulses.   Assessment:    Healthy female exam.    Plan:   Pap done, will follow up results and manage accordingly. OCPs refilled Routine preventative health maintenance measures emphasized

## 2013-04-17 ENCOUNTER — Ambulatory Visit: Payer: Self-pay | Admitting: General Practice

## 2013-06-05 DIAGNOSIS — M722 Plantar fascial fibromatosis: Secondary | ICD-10-CM | POA: Insufficient documentation

## 2013-06-12 ENCOUNTER — Ambulatory Visit (INDEPENDENT_AMBULATORY_CARE_PROVIDER_SITE_OTHER): Payer: BC Managed Care – PPO

## 2013-06-12 ENCOUNTER — Encounter: Payer: Self-pay | Admitting: Podiatry

## 2013-06-12 ENCOUNTER — Other Ambulatory Visit: Payer: Self-pay | Admitting: *Deleted

## 2013-06-12 ENCOUNTER — Ambulatory Visit (INDEPENDENT_AMBULATORY_CARE_PROVIDER_SITE_OTHER): Payer: BC Managed Care – PPO | Admitting: Podiatry

## 2013-06-12 VITALS — BP 137/84 | HR 71 | Resp 16 | Ht 62.0 in | Wt 192.0 lb

## 2013-06-12 DIAGNOSIS — E119 Type 2 diabetes mellitus without complications: Secondary | ICD-10-CM

## 2013-06-12 DIAGNOSIS — M722 Plantar fascial fibromatosis: Secondary | ICD-10-CM

## 2013-06-12 MED ORDER — MELOXICAM 15 MG PO TABS: 15.0000 mg | ORAL_TABLET | Freq: Every day | ORAL | Status: DC

## 2013-06-12 NOTE — Patient Instructions (Signed)
Plantar Fasciitis Plantar fasciitis is a common condition that causes foot pain. It is soreness (inflammation) of the band of tough fibrous tissue on the bottom of the foot that runs from the heel bone (calcaneus) to the ball of the foot. The cause of this soreness may be from excessive standing, poor fitting shoes, running on hard surfaces, being overweight, having an abnormal walk, or overuse (this is common in runners) of the painful foot or feet. It is also common in aerobic exercise dancers and ballet dancers. SYMPTOMS  Most people with plantar fasciitis complain of:  Severe pain in the morning on the bottom of their foot especially when taking the first steps out of bed. This pain recedes after a few minutes of walking.  Severe pain is experienced also during walking following a long period of inactivity.  Pain is worse when walking barefoot or up stairs DIAGNOSIS   Your caregiver will diagnose this condition by examining and feeling your foot.  Special tests such as X-rays of your foot, are usually not needed. PREVENTION   Consult a sports medicine professional before beginning a new exercise program.  Walking programs offer a good workout. With walking there is a lower chance of overuse injuries common to runners. There is less impact and less jarring of the joints.  Begin all new exercise programs slowly. If problems or pain develop, decrease the amount of time or distance until you are at a comfortable level.  Wear good shoes and replace them regularly.  Stretch your foot and the heel cords at the back of the ankle (Achilles tendon) both before and after exercise.  Run or exercise on even surfaces that are not hard. For example, asphalt is better than pavement.  Do not run barefoot on hard surfaces.  If using a treadmill, vary the incline.  Do not continue to workout if you have foot or joint problems. Seek professional help if they do not improve. HOME CARE INSTRUCTIONS     Avoid activities that cause you pain until you recover.  Use ice or cold packs on the problem or painful areas after working out.  Only take over-the-counter or prescription medicines for pain, discomfort, or fever as directed by your caregiver.  Soft shoe inserts or athletic shoes with air or gel sole cushions may be helpful.  If problems continue or become more severe, consult a sports medicine caregiver or your own health care provider. Cortisone is a potent anti-inflammatory medication that may be injected into the painful area. You can discuss this treatment with your caregiver. MAKE SURE YOU:   Understand these instructions.  Will watch your condition.  Will get help right away if you are not doing well or get worse. Document Released: 10/12/2000 Document Revised: 04/11/2011 Document Reviewed: 12/12/2007 [facility] IncExitCare Patient Information 2014 [place], [place]. Diabetes and Foot Care Diabetes may cause you to have problems because of poor blood supply (circulation) to your feet and legs. This may cause the skin on your feet to become thinner, break easier, and heal more slowly. Your skin may become dry, and the skin may peel and crack. You may also have nerve damage in your legs and feet causing decreased feeling in them. You may not notice minor injuries to your feet that could lead to infections or more serious problems. Taking care of your feet is one of the most important things you can do for yourself.  HOME CARE INSTRUCTIONS  Wear shoes at all times, even in the house. Do not  go barefoot. Bare feet are easily injured.  Check your feet daily for blisters, cuts, and redness. If you cannot see the bottom of your feet, use a mirror or ask someone for help.  Wash your feet with warm water (do not use hot water) and mild soap. Then pat your feet and the areas between your toes until they are completely dry. Do not soak your feet as this can dry your skin.  Apply a moisturizing lotion  or petroleum jelly (that does not contain alcohol and is unscented) to the skin on your feet and to dry, brittle toenails. Do not apply lotion between your toes.  Trim your toenails straight across. Do not dig under them or around the cuticle. File the edges of your nails with an emery board or nail file.  Do not cut corns or calluses or try to remove them with medicine.  Wear clean socks or stockings every day. Make sure they are not too tight. Do not wear knee-high stockings since they may decrease blood flow to your legs.  Wear shoes that fit properly and have enough cushioning. To break in new shoes, wear them for just a few hours a day. This prevents you from injuring your feet. Always look in your shoes before you put them on to be sure there are no objects inside.  Do not cross your legs. This may decrease the blood flow to your feet.  If you find a minor scrape, cut, or break in the skin on your feet, keep it and the skin around it clean and dry. These areas may be cleansed with mild soap and water. Do not cleanse the area with peroxide, alcohol, or iodine.  When you remove an adhesive bandage, be sure not to damage the skin around it.  If you have a wound, look at it several times a day to make sure it is healing.  Do not use heating pads or hot water bottles. They may burn your skin. If you have lost feeling in your feet or legs, you may not know it is happening until it is too late.  Make sure your health care provider performs a complete foot exam at least annually or more often if you have foot problems. Report any cuts, sores, or bruises to your health care provider immediately. SEEK MEDICAL CARE IF:   You have an injury that is not healing.  You have cuts or breaks in the skin.  You have an ingrown nail.  You notice redness on your legs or feet.  You feel burning or tingling in your legs or feet.  You have pain or cramps in your legs and feet.  Your legs or feet are  numb.  Your feet always feel cold. SEEK IMMEDIATE MEDICAL CARE IF:   There is increasing redness, swelling, or pain in or around a wound.  There is a red line that goes up your leg.  Pus is coming from a wound.  You develop a fever or as directed by your health care provider.  You notice a bad smell coming from an ulcer or wound. Document Released: 01/15/2000 Document Revised: 09/19/2012 Document Reviewed: 06/26/2012 [facility] Patient Information 2014 [place]ExitCare, [place].

## 2013-06-12 NOTE — Progress Notes (Signed)
   Subjective:    Patient ID: Lori Carr, female    DOB: 06/27/1965, 48 y.o.   MRN: [id number]  HPI Comments: Pain in the right heel for a few weeks now. It can be sharp. Especially after sitting and than getting up. Been using different shoes, and rolling it on a ice bottle   Foot Pain    Diabetes last a1c 7.9  Review of Systems  Endocrine:       Diabetes   All other systems reviewed and are negative.      Objective:   Physical Exam: I have reviewed her past history medications allergies surgeries social history and review of systems. Pulses are strongly palpable neurologic sensorium is intact. Strength is 5 over 5 dorsiflexors plantar flexors inverters everters all intrinsic musculature is intact. Orthopedic evaluation Mr. is all joints distal to the ankle a full range of motion without crepitation. She has pain on palpation medial continued tubercle of the right heel. Radiographic evaluation does demonstrate soft tissue increase in density at the plantar fascial calcaneal insertion site of the right foot.        Assessment & Plan:  Assessment: Plantar fasciitis right foot.  Plan: Discussed etiology pathology conservative versus surgical therapies at this point we started her on of the he milligrams 1 by mouth daily. I injected her right heel today with Kenalog and local anesthetic. Dispensed a night splint as well as a plantar fascial brace. We discussed stretching exercises ice therapy shoe gear modifications area I will followup with her in one month.

## 2013-07-10 ENCOUNTER — Ambulatory Visit: Payer: BC Managed Care – PPO | Admitting: Podiatry

## 2013-07-10 VITALS — BP 126/82 | HR 84 | Resp 16

## 2013-07-10 DIAGNOSIS — M722 Plantar fascial fibromatosis: Secondary | ICD-10-CM

## 2013-07-10 DIAGNOSIS — M79609 Pain in unspecified limb: Secondary | ICD-10-CM

## 2013-07-10 NOTE — Progress Notes (Signed)
She presents today for followup of plantar fasciitis her right heel. She states it is approximately 70-75% better. She states that the majority of the improvement has occurred in the past week. She continues to wear good shoe gear and continue her anti-inflammatories and the use of the night splint. She requests that we did not inject her today.  Objective: Vital signs are stable she is alert and oriented x3. Pulses are palpable. She has pain on palpation medial calcaneal tubercle of the right heel. It is not warm to the touch however. She also has a skin fissure caused by mechanical debridement of the left heel. It does not appear to be clinically infected at this time.  Assessment: Pain in limb secondary to plantar fasciitis right. Skin fissure left heel. Diabetes mellitus.  Plan: Discussed etiology pathology conservative versus surgical therapy I suggested another injection however she declined. I suggested that she continue all conservative therapies and we discussed the possible need for orthotics. I also suggested an over-the-counter ointment for the skin fissure. I will followup with her in one month if necessary.

## 2013-08-07 ENCOUNTER — Ambulatory Visit: Payer: BC Managed Care – PPO | Admitting: Podiatry

## 2013-12-02 ENCOUNTER — Encounter: Payer: Self-pay | Admitting: Podiatry

## 2013-12-31 ENCOUNTER — Ambulatory Visit: Payer: BC Managed Care – PPO | Admitting: Obstetrics & Gynecology

## 2014-01-07 ENCOUNTER — Ambulatory Visit (INDEPENDENT_AMBULATORY_CARE_PROVIDER_SITE_OTHER): Payer: BC Managed Care – PPO | Admitting: Family Medicine

## 2014-01-07 ENCOUNTER — Encounter: Payer: Self-pay | Admitting: Family Medicine

## 2014-01-07 VITALS — BP 119/78 | HR 90 | Ht 62.0 in | Wt 192.0 lb

## 2014-01-07 DIAGNOSIS — Z124 Encounter for screening for malignant neoplasm of cervix: Secondary | ICD-10-CM | POA: Diagnosis not present

## 2014-01-07 DIAGNOSIS — Z3041 Encounter for surveillance of contraceptive pills: Secondary | ICD-10-CM | POA: Diagnosis not present

## 2014-01-07 DIAGNOSIS — Z01419 Encounter for gynecological examination (general) (routine) without abnormal findings: Secondary | ICD-10-CM

## 2014-01-07 DIAGNOSIS — Z1151 Encounter for screening for human papillomavirus (HPV): Secondary | ICD-10-CM | POA: Diagnosis not present

## 2014-01-07 MED ORDER — NORGESTIMATE-ETH ESTRADIOL 0.25-35 MG-MCG PO TABS: 1.0000 | ORAL_TABLET | Freq: Every day | ORAL | Status: DC

## 2014-01-07 NOTE — Patient Instructions (Signed)
Preventive Care for Adults A healthy lifestyle and preventive care can promote health and wellness. Preventive health guidelines for women include the following key practices.  A routine yearly physical is a good way to check with your health care provider about your health and preventive screening. It is a chance to share any concerns and updates on your health and to receive a thorough exam.  Visit your dentist for a routine exam and preventive care every 6 months. Brush your teeth twice a day and floss once a day. Good oral hygiene prevents tooth decay and gum disease.  The frequency of eye exams is based on your age, health, family medical history, use of contact lenses, and other factors. Follow your health care provider's recommendations for frequency of eye exams.  Eat a healthy diet. Foods like vegetables, fruits, whole grains, low-fat dairy products, and lean protein foods contain the nutrients you need without too many calories. Decrease your intake of foods high in solid fats, added sugars, and salt. Eat the right amount of calories for you.Get information about a proper diet from your health care provider, if necessary.  Regular physical exercise is one of the most important things you can do for your health. Most adults should get at least 150 minutes of moderate-intensity exercise (any activity that increases your heart rate and causes you to sweat) each week. In addition, most adults need muscle-strengthening exercises on 2 or more days a week.  Maintain a healthy weight. The body mass index (BMI) is a screening tool to identify possible weight problems. It provides an estimate of body fat based on height and weight. Your health care provider can find your BMI and can help you achieve or maintain a healthy weight.For adults 20 years and older:  A BMI below 18.5 is considered underweight.  A BMI of 18.5 to 24.9 is normal.  A BMI of 25 to 29.9 is considered overweight.  A BMI of  30 and above is considered obese.  Maintain normal blood lipids and cholesterol levels by exercising and minimizing your intake of saturated fat. Eat a balanced diet with plenty of fruit and vegetables. Blood tests for lipids and cholesterol should begin at age 76 and be repeated every 5 years. If your lipid or cholesterol levels are high, you are over 50, or you are at high risk for heart disease, you may need your cholesterol levels checked more frequently.Ongoing high lipid and cholesterol levels should be treated with medicines if diet and exercise are not working.  If you smoke, find out from your health care provider how to quit. If you do not use tobacco, do not start.  Lung cancer screening is recommended for adults aged 22-80 years who are at high risk for developing lung cancer because of a history of smoking. A yearly low-dose CT scan of the lungs is recommended for people who have at least a 30-pack-year history of smoking and are a current smoker or have quit within the past 15 years. A pack year of smoking is smoking an average of 1 pack of cigarettes a day for 1 year (for example: 1 pack a day for 30 years or 2 packs a day for 15 years). Yearly screening should continue until the smoker has stopped smoking for at least 15 years. Yearly screening should be stopped for people who develop a health problem that would prevent them from having lung cancer treatment.  If you are pregnant, do not drink alcohol. If you are breastfeeding,  be very cautious about drinking alcohol. If you are not pregnant and choose to drink alcohol, do not have more than 1 drink per day. One drink is considered to be 12 ounces (355 mL) of beer, 5 ounces (148 mL) of wine, or 1.5 ounces (44 mL) of liquor.  Avoid use of street drugs. Do not share needles with anyone. Ask for help if you need support or instructions about stopping the use of drugs.  High blood pressure causes heart disease and increases the risk of  stroke. Your blood pressure should be checked at least every 1 to 2 years. Ongoing high blood pressure should be treated with medicines if weight loss and exercise do not work.  If you are 75-52 years old, ask your health care provider if you should take aspirin to prevent strokes.  Diabetes screening involves taking a blood sample to check your fasting blood sugar level. This should be done once every 3 years, after age 15, if you are within normal weight and without risk factors for diabetes. Testing should be considered at a younger age or be carried out more frequently if you are overweight and have at least 1 risk factor for diabetes.  Breast cancer screening is essential preventive care for women. You should practice "breast self-awareness." This means understanding the normal appearance and feel of your breasts and may include breast self-examination. Any changes detected, no matter how small, should be reported to a health care provider. Women in their 58s and 30s should have a clinical breast exam (CBE) by a health care provider as part of a regular health exam every 1 to 3 years. After age 16, women should have a CBE every year. Starting at age 53, women should consider having a mammogram (breast X-ray test) every year. Women who have a family history of breast cancer should talk to their health care provider about genetic screening. Women at a high risk of breast cancer should talk to their health care providers about having an MRI and a mammogram every year.  Breast cancer gene (BRCA)-related cancer risk assessment is recommended for women who have family members with BRCA-related cancers. BRCA-related cancers include breast, ovarian, tubal, and peritoneal cancers. Having family members with these cancers may be associated with an increased risk for harmful changes (mutations) in the breast cancer genes BRCA1 and BRCA2. Results of the assessment will determine the need for genetic counseling and  BRCA1 and BRCA2 testing.  Routine pelvic exams to screen for cancer are no longer recommended for nonpregnant women who are considered low risk for cancer of the pelvic organs (ovaries, uterus, and vagina) and who do not have symptoms. Ask your health care provider if a screening pelvic exam is right for you.  If you have had past treatment for cervical cancer or a condition that could lead to cancer, you need Pap tests and screening for cancer for at least 20 years after your treatment. If Pap tests have been discontinued, your risk factors (such as having a new sexual partner) need to be reassessed to determine if screening should be resumed. Some women have medical problems that increase the chance of getting cervical cancer. In these cases, your health care provider may recommend more frequent screening and Pap tests.  The HPV test is an additional test that may be used for cervical cancer screening. The HPV test looks for the virus that can cause the cell changes on the cervix. The cells collected during the Pap test can be  tested for HPV. The HPV test could be used to screen women aged 30 years and older, and should be used in women of any age who have unclear Pap test results. After the age of 30, women should have HPV testing at the same frequency as a Pap test.  Colorectal cancer can be detected and often prevented. Most routine colorectal cancer screening begins at the age of 50 years and continues through age 75 years. However, your health care provider may recommend screening at an earlier age if you have risk factors for colon cancer. On a yearly basis, your health care provider may provide home test kits to check for hidden blood in the stool. Use of a small camera at the end of a tube, to directly examine the colon (sigmoidoscopy or colonoscopy), can detect the earliest forms of colorectal cancer. Talk to your health care provider about this at age 50, when routine screening begins. Direct  exam of the colon should be repeated every 5-10 years through age 75 years, unless early forms of pre-cancerous polyps or small growths are found.  People who are at an increased risk for hepatitis B should be screened for this virus. You are considered at high risk for hepatitis B if:  You were born in a country where hepatitis B occurs often. Talk with your health care provider about which countries are considered high risk.  Your parents were born in a high-risk country and you have not received a shot to protect against hepatitis B (hepatitis B vaccine).  You have HIV or AIDS.  You use needles to inject street drugs.  You live with, or have sex with, someone who has hepatitis B.  You get hemodialysis treatment.  You take certain medicines for conditions like cancer, organ transplantation, and autoimmune conditions.  Hepatitis C blood testing is recommended for all people born from 1945 through 1965 and any individual with known risks for hepatitis C.  Practice safe sex. Use condoms and avoid high-risk sexual practices to reduce the spread of sexually transmitted infections (STIs). STIs include gonorrhea, chlamydia, syphilis, trichomonas, herpes, HPV, and human immunodeficiency virus (HIV). Herpes, HIV, and HPV are viral illnesses that have no cure. They can result in disability, cancer, and death.  You should be screened for sexually transmitted illnesses (STIs) including gonorrhea and chlamydia if:  You are sexually active and are younger than 24 years.  You are older than 24 years and your health care provider tells you that you are at risk for this type of infection.  Your sexual activity has changed since you were last screened and you are at an increased risk for chlamydia or gonorrhea. Ask your health care provider if you are at risk.  If you are at risk of being infected with HIV, it is recommended that you take a prescription medicine daily to prevent HIV infection. This is  called preexposure prophylaxis (PrEP). You are considered at risk if:  You are a heterosexual woman, are sexually active, and are at increased risk for HIV infection.  You take drugs by injection.  You are sexually active with a partner who has HIV.  Talk with your health care provider about whether you are at high risk of being infected with HIV. If you choose to begin PrEP, you should first be tested for HIV. You should then be tested every 3 months for as long as you are taking PrEP.  Osteoporosis is a disease in which the bones lose minerals and strength   with aging. This can result in serious bone fractures or breaks. The risk of osteoporosis can be identified using a bone density scan. Women ages 65 years and over and women at risk for fractures or osteoporosis should discuss screening with their health care providers. Ask your health care provider whether you should take a calcium supplement or vitamin D to reduce the rate of osteoporosis.  Menopause can be associated with physical symptoms and risks. Hormone replacement therapy is available to decrease symptoms and risks. You should talk to your health care provider about whether hormone replacement therapy is right for you.  Use sunscreen. Apply sunscreen liberally and repeatedly throughout the day. You should seek shade when your shadow is shorter than you. Protect yourself by wearing long sleeves, pants, a wide-brimmed hat, and sunglasses year round, whenever you are outdoors.  Once a month, do a whole body skin exam, using a mirror to look at the skin on your back. Tell your health care provider of new moles, moles that have irregular borders, moles that are larger than a pencil eraser, or moles that have changed in shape or color.  Stay current with required vaccines (immunizations).  Influenza vaccine. All adults should be immunized every year.  Tetanus, diphtheria, and acellular pertussis (Td, Tdap) vaccine. Pregnant women should  receive 1 dose of Tdap vaccine during each pregnancy. The dose should be obtained regardless of the length of time since the last dose. Immunization is preferred during the 27th-36th week of gestation. An adult who has not previously received Tdap or who does not know her vaccine status should receive 1 dose of Tdap. This initial dose should be followed by tetanus and diphtheria toxoids (Td) booster doses every 10 years. Adults with an unknown or incomplete history of completing a 3-dose immunization series with Td-containing vaccines should begin or complete a primary immunization series including a Tdap dose. Adults should receive a Td booster every 10 years.  Varicella vaccine. An adult without evidence of immunity to varicella should receive 2 doses or a second dose if she has previously received 1 dose. Pregnant females who do not have evidence of immunity should receive the first dose after pregnancy. This first dose should be obtained before leaving the health care facility. The second dose should be obtained 4-8 weeks after the first dose.  Human papillomavirus (HPV) vaccine. Females aged 13-26 years who have not received the vaccine previously should obtain the 3-dose series. The vaccine is not recommended for use in pregnant females. However, pregnancy testing is not needed before receiving a dose. If a female is found to be pregnant after receiving a dose, no treatment is needed. In that case, the remaining doses should be delayed until after the pregnancy. Immunization is recommended for any person with an immunocompromised condition through the age of 26 years if she did not get any or all doses earlier. During the 3-dose series, the second dose should be obtained 4-8 weeks after the first dose. The third dose should be obtained 24 weeks after the first dose and 16 weeks after the second dose.  Zoster vaccine. One dose is recommended for adults aged 60 years or older unless certain conditions are  present.  Measles, mumps, and rubella (MMR) vaccine. Adults born before 1957 generally are considered immune to measles and mumps. Adults born in 1957 or later should have 1 or more doses of MMR vaccine unless there is a contraindication to the vaccine or there is laboratory evidence of immunity to   each of the three diseases. A routine second dose of MMR vaccine should be obtained at least 28 days after the first dose for students attending postsecondary schools, health care workers, or international travelers. People who received inactivated measles vaccine or an unknown type of measles vaccine during 1963-1967 should receive 2 doses of MMR vaccine. People who received inactivated mumps vaccine or an unknown type of mumps vaccine before 1979 and are at high risk for mumps infection should consider immunization with 2 doses of MMR vaccine. For females of childbearing age, rubella immunity should be determined. If there is no evidence of immunity, females who are not pregnant should be vaccinated. If there is no evidence of immunity, females who are pregnant should delay immunization until after pregnancy. Unvaccinated health care workers born before 1957 who lack laboratory evidence of measles, mumps, or rubella immunity or laboratory confirmation of disease should consider measles and mumps immunization with 2 doses of MMR vaccine or rubella immunization with 1 dose of MMR vaccine.  Pneumococcal 13-valent conjugate (PCV13) vaccine. When indicated, a person who is uncertain of her immunization history and has no record of immunization should receive the PCV13 vaccine. An adult aged 19 years or older who has certain medical conditions and has not been previously immunized should receive 1 dose of PCV13 vaccine. This PCV13 should be followed with a dose of pneumococcal polysaccharide (PPSV23) vaccine. The PPSV23 vaccine dose should be obtained at least 8 weeks after the dose of PCV13 vaccine. An adult aged 19  years or older who has certain medical conditions and previously received 1 or more doses of PPSV23 vaccine should receive 1 dose of PCV13. The PCV13 vaccine dose should be obtained 1 or more years after the last PPSV23 vaccine dose.  Pneumococcal polysaccharide (PPSV23) vaccine. When PCV13 is also indicated, PCV13 should be obtained first. All adults aged 65 years and older should be immunized. An adult younger than age 65 years who has certain medical conditions should be immunized. Any person who resides in a nursing home or long-term care facility should be immunized. An adult smoker should be immunized. People with an immunocompromised condition and certain other conditions should receive both PCV13 and PPSV23 vaccines. People with human immunodeficiency virus (HIV) infection should be immunized as soon as possible after diagnosis. Immunization during chemotherapy or radiation therapy should be avoided. Routine use of PPSV23 vaccine is not recommended for American Indians, [place] Natives, or people younger than 65 years unless there are medical conditions that require PPSV23 vaccine. When indicated, people who have unknown immunization and have no record of immunization should receive PPSV23 vaccine. One-time revaccination 5 years after the first dose of PPSV23 is recommended for people aged 19-64 years who have chronic kidney failure, nephrotic syndrome, asplenia, or immunocompromised conditions. People who received 1-2 doses of PPSV23 before age 65 years should receive another dose of PPSV23 vaccine at age 65 years or later if at least 5 years have passed since the previous dose. Doses of PPSV23 are not needed for people immunized with PPSV23 at or after age 65 years.  Meningococcal vaccine. Adults with asplenia or persistent complement component deficiencies should receive 2 doses of quadrivalent meningococcal conjugate (MenACWY-D) vaccine. The doses should be obtained at least 2 months apart.  Microbiologists working with certain meningococcal bacteria, military recruits, people at risk during an outbreak, and people who travel to or live in countries with a high rate of meningitis should be immunized. A first-year college student up through age   21 years who is living in a residence hall should receive a dose if she did not receive a dose on or after her 16th birthday. Adults who have certain high-risk conditions should receive one or more doses of vaccine.  Hepatitis A vaccine. Adults who wish to be protected from this disease, have certain high-risk conditions, work with hepatitis A-infected animals, work in hepatitis A research labs, or travel to or work in countries with a high rate of hepatitis A should be immunized. Adults who were previously unvaccinated and who anticipate close contact with an international adoptee during the first 60 days after arrival in the [place] from a country with a high rate of hepatitis A should be immunized.  Hepatitis B vaccine. Adults who wish to be protected from this disease, have certain high-risk conditions, may be exposed to blood or other infectious body fluids, are household contacts or sex partners of hepatitis B positive people, are clients or workers in certain care facilities, or travel to or work in countries with a high rate of hepatitis B should be immunized.  Haemophilus influenzae type b (Hib) vaccine. A previously unvaccinated person with asplenia or sickle cell disease or having a scheduled splenectomy should receive 1 dose of Hib vaccine. Regardless of previous immunization, a recipient of a hematopoietic stem cell transplant should receive a 3-dose series 6-12 months after her successful transplant. Hib vaccine is not recommended for adults with HIV infection. Preventive Services / Frequency Ages 64 to 68 years  Blood pressure check.** / Every 1 to 2 years.  Lipid and cholesterol check.** / Every 5 years beginning at age  22.  Clinical breast exam.** / Every 3 years for women in their 88s and 53s.  BRCA-related cancer risk assessment.** / For women who have family members with a BRCA-related cancer (breast, ovarian, tubal, or peritoneal cancers).  Pap test.** / Every 2 years from ages 90 through 51. Every 3 years starting at age 21 through age 56 or 3 with a history of 3 consecutive normal Pap tests.  HPV screening.** / Every 3 years from ages 24 through ages 1 to 46 with a history of 3 consecutive normal Pap tests.  Hepatitis C blood test.** / For any individual with known risks for hepatitis C.  Skin self-exam. / Monthly.  Influenza vaccine. / Every year.  Tetanus, diphtheria, and acellular pertussis (Tdap, Td) vaccine.** / Consult your health care provider. Pregnant women should receive 1 dose of Tdap vaccine during each pregnancy. 1 dose of Td every 10 years.  Varicella vaccine.** / Consult your health care provider. Pregnant females who do not have evidence of immunity should receive the first dose after pregnancy.  HPV vaccine. / 3 doses over 6 months, if 72 and younger. The vaccine is not recommended for use in pregnant females. However, pregnancy testing is not needed before receiving a dose.  Measles, mumps, rubella (MMR) vaccine.** / You need at least 1 dose of MMR if you were born in 1957 or later. You may also need a 2nd dose. For females of childbearing age, rubella immunity should be determined. If there is no evidence of immunity, females who are not pregnant should be vaccinated. If there is no evidence of immunity, females who are pregnant should delay immunization until after pregnancy.  Pneumococcal 13-valent conjugate (PCV13) vaccine.** / Consult your health care provider.  Pneumococcal polysaccharide (PPSV23) vaccine.** / 1 to 2 doses if you smoke cigarettes or if you have certain conditions.  Meningococcal vaccine.** /  1 dose if you are age 19 to 21 years and a first-year college  student living in a residence hall, or have one of several medical conditions, you need to get vaccinated against meningococcal disease. You may also need additional booster doses.  Hepatitis A vaccine.** / Consult your health care provider.  Hepatitis B vaccine.** / Consult your health care provider.  Haemophilus influenzae type b (Hib) vaccine.** / Consult your health care provider. Ages 40 to 64 years  Blood pressure check.** / Every 1 to 2 years.  Lipid and cholesterol check.** / Every 5 years beginning at age 20 years.  Lung cancer screening. / Every year if you are aged 55-80 years and have a 30-pack-year history of smoking and currently smoke or have quit within the past 15 years. Yearly screening is stopped once you have quit smoking for at least 15 years or develop a health problem that would prevent you from having lung cancer treatment.  Clinical breast exam.** / Every year after age 40 years.  BRCA-related cancer risk assessment.** / For women who have family members with a BRCA-related cancer (breast, ovarian, tubal, or peritoneal cancers).  Mammogram.** / Every year beginning at age 40 years and continuing for as long as you are in good health. Consult with your health care provider.  Pap test.** / Every 3 years starting at age 30 years through age 65 or 70 years with a history of 3 consecutive normal Pap tests.  HPV screening.** / Every 3 years from ages 30 years through ages 65 to 70 years with a history of 3 consecutive normal Pap tests.  Fecal occult blood test (FOBT) of stool. / Every year beginning at age 50 years and continuing until age 75 years. You may not need to do this test if you get a colonoscopy every 10 years.  Flexible sigmoidoscopy or colonoscopy.** / Every 5 years for a flexible sigmoidoscopy or every 10 years for a colonoscopy beginning at age 50 years and continuing until age 75 years.  Hepatitis C blood test.** / For all people born from 1945 through  1965 and any individual with known risks for hepatitis C.  Skin self-exam. / Monthly.  Influenza vaccine. / Every year.  Tetanus, diphtheria, and acellular pertussis (Tdap/Td) vaccine.** / Consult your health care provider. Pregnant women should receive 1 dose of Tdap vaccine during each pregnancy. 1 dose of Td every 10 years.  Varicella vaccine.** / Consult your health care provider. Pregnant females who do not have evidence of immunity should receive the first dose after pregnancy.  Zoster vaccine.** / 1 dose for adults aged 60 years or older.  Measles, mumps, rubella (MMR) vaccine.** / You need at least 1 dose of MMR if you were born in 1957 or later. You may also need a 2nd dose. For females of childbearing age, rubella immunity should be determined. If there is no evidence of immunity, females who are not pregnant should be vaccinated. If there is no evidence of immunity, females who are pregnant should delay immunization until after pregnancy.  Pneumococcal 13-valent conjugate (PCV13) vaccine.** / Consult your health care provider.  Pneumococcal polysaccharide (PPSV23) vaccine.** / 1 to 2 doses if you smoke cigarettes or if you have certain conditions.  Meningococcal vaccine.** / Consult your health care provider.  Hepatitis A vaccine.** / Consult your health care provider.  Hepatitis B vaccine.** / Consult your health care provider.  Haemophilus influenzae type b (Hib) vaccine.** / Consult your health care provider. Ages 65   years and over  Blood pressure check.** / Every 1 to 2 years.  Lipid and cholesterol check.** / Every 5 years beginning at age 22 years.  Lung cancer screening. / Every year if you are aged 73-80 years and have a 30-pack-year history of smoking and currently smoke or have quit within the past 15 years. Yearly screening is stopped once you have quit smoking for at least 15 years or develop a health problem that would prevent you from having lung cancer  treatment.  Clinical breast exam.** / Every year after age 4 years.  BRCA-related cancer risk assessment.** / For women who have family members with a BRCA-related cancer (breast, ovarian, tubal, or peritoneal cancers).  Mammogram.** / Every year beginning at age 40 years and continuing for as long as you are in good health. Consult with your health care provider.  Pap test.** / Every 3 years starting at age 9 years through age 34 or 91 years with 3 consecutive normal Pap tests. Testing can be stopped between 65 and 70 years with 3 consecutive normal Pap tests and no abnormal Pap or HPV tests in the past 10 years.  HPV screening.** / Every 3 years from ages 57 years through ages 64 or 45 years with a history of 3 consecutive normal Pap tests. Testing can be stopped between 65 and 70 years with 3 consecutive normal Pap tests and no abnormal Pap or HPV tests in the past 10 years.  Fecal occult blood test (FOBT) of stool. / Every year beginning at age 15 years and continuing until age 17 years. You may not need to do this test if you get a colonoscopy every 10 years.  Flexible sigmoidoscopy or colonoscopy.** / Every 5 years for a flexible sigmoidoscopy or every 10 years for a colonoscopy beginning at age 86 years and continuing until age 71 years.  Hepatitis C blood test.** / For all people born from [birth year] through 1964 and any individual with known risks for hepatitis C.  Osteoporosis screening.** / A one-time screening for women ages 83 years and over and women at risk for fractures or osteoporosis.  Skin self-exam. / Monthly.  Influenza vaccine. / Every year.  Tetanus, diphtheria, and acellular pertussis (Tdap/Td) vaccine.** / 1 dose of Td every 10 years.  Varicella vaccine.** / Consult your health care provider.  Zoster vaccine.** / 1 dose for adults aged 61 years or older.  Pneumococcal 13-valent conjugate (PCV13) vaccine.** / Consult your health care provider.  Pneumococcal  polysaccharide (PPSV23) vaccine.** / 1 dose for all adults aged 28 years and older.  Meningococcal vaccine.** / Consult your health care provider.  Hepatitis A vaccine.** / Consult your health care provider.  Hepatitis B vaccine.** / Consult your health care provider.  Haemophilus influenzae type b (Hib) vaccine.** / Consult your health care provider. ** Family history and personal history of risk and conditions may change your health care provider's recommendations. Document Released: 03/15/2001 Document Revised: 06/03/2013 Document Reviewed: 06/14/2010 [facility] Patient Information 2015 [place], [place]. This information is not intended to replace advice given to you by your health care provider. Make sure you discuss any questions you have with your health care provider.

## 2014-01-07 NOTE — Progress Notes (Signed)
  Subjective:     Lori Carr is a 48 y.o. female and is here for a comprehensive physical exam. The patient reports no problems. Reports left leg pain worse at night.  Hgb A1C is 8.  Continues on OC's, no issues.  History   Social History  . Marital Status: Married    Spouse Name: N/A    Number of Children: N/A  . Years of Education: N/A   Occupational History  . Supervisor, [employer]   Social History Main Topics  . Smoking status: Never Smoker   . Smokeless tobacco: Never Used  . Alcohol Use: No  . Drug Use: No  . Sexual Activity:    Partners: Male    Birth Control/ Protection: Pill   Other Topics Concern  . Not on file   Social History Narrative   Health Maintenance  Topic Date Due  . HEMOGLOBIN A1C  05/18/66  . PNEUMOCOCCAL POLYSACCHARIDE VACCINE (1) 06/08/1968  . FOOT EXAM  06/08/1976  . OPHTHALMOLOGY EXAM  06/08/1976  . URINE MICROALBUMIN  06/08/1976  . TETANUS/TDAP  06/08/1985  . INFLUENZA VACCINE  08/31/2013  . PAP SMEAR  12/11/2015    The following portions of the patient's history were reviewed and updated as appropriate: allergies, current medications, past family history, past medical history, past social history, past surgical history and problem list.  Review of Systems Pertinent items are noted in HPI.   Objective:    BP 119/78 mmHg  Pulse 90  Ht 5\' 2"  (1.575 m)  Wt 192 lb (87.091 kg)  BMI 35.11 kg/m2  LMP 12/31/2013 General appearance: alert, cooperative and appears stated age Head: Normocephalic, without obvious abnormality, atraumatic Neck: no adenopathy, supple, symmetrical, trachea midline and thyroid not enlarged, symmetric, no tenderness/mass/nodules Lungs: clear to auscultation bilaterally Breasts: normal appearance, no masses or tenderness Heart: regular rate and rhythm, S1, S2 normal, no murmur, click, rub or gallop Abdomen: soft, non-tender; bowel sounds normal; no masses,  no organomegaly Pelvic:  cervix normal in appearance, external genitalia normal, no adnexal masses or tenderness, no cervical motion tenderness, uterus normal size, shape, and consistency and vagina normal without discharge Extremities: extremities normal, atraumatic, no cyanosis or edema Pulses: 2+ and symmetric Skin: Skin color, texture, turgor normal. No rashes or lesions Lymph nodes: Cervical, supraclavicular, and axillary nodes normal. Neurologic: Grossly normal    Assessment:    Healthy female exam.      Plan:      Problem List Items Addressed This Visit    None    Visit Diagnoses    Screening for malignant neoplasm of cervix    -  Primary    Relevant Orders       Cytology - PAP    Encounter for routine gynecological examination        Encounter for surveillance of contraceptive pills        Relevant Medications       norgestimate-ethinyl estradiol (ORTHO-CYCLEN,SPRINTEC,PREVIFEM) 0.25-35 MG-MCG tablet      Labs and mammogram through her work. See After Visit Summary for Counseling Recommendations

## 2014-01-08 LAB — CYTOLOGY - PAP

## 2014-04-22 ENCOUNTER — Ambulatory Visit: Payer: Self-pay | Admitting: General Practice

## 2014-05-28 ENCOUNTER — Ambulatory Visit
Admit: 2014-05-28 | Disposition: A | Discharge status: Home / Self Care | Payer: Self-pay | Attending: General Practice | Admitting: General Practice

## 2014-07-22 DIAGNOSIS — M179 Osteoarthritis of knee, unspecified: Secondary | ICD-10-CM | POA: Insufficient documentation

## 2014-07-22 DIAGNOSIS — M1710 Unilateral primary osteoarthritis, unspecified knee: Secondary | ICD-10-CM | POA: Insufficient documentation

## 2014-07-28 ENCOUNTER — Other Ambulatory Visit: Payer: Self-pay

## 2014-07-31 DIAGNOSIS — E109 Type 1 diabetes mellitus without complications: Secondary | ICD-10-CM | POA: Insufficient documentation

## 2014-08-29 ENCOUNTER — Ambulatory Visit: Payer: Worker's Compensation | Attending: Specialist | Admitting: Physical Therapy

## 2014-08-29 ENCOUNTER — Ambulatory Visit: Payer: Worker's Compensation | Admitting: Physical Therapy

## 2014-08-29 DIAGNOSIS — G8929 Other chronic pain: Secondary | ICD-10-CM | POA: Diagnosis not present

## 2014-08-29 DIAGNOSIS — M25572 Pain in left ankle and joints of left foot: Secondary | ICD-10-CM | POA: Diagnosis not present

## 2014-08-29 DIAGNOSIS — M259 Joint disorder, unspecified: Secondary | ICD-10-CM | POA: Insufficient documentation

## 2014-08-29 DIAGNOSIS — R29898 Other symptoms and signs involving the musculoskeletal system: Secondary | ICD-10-CM

## 2014-08-29 DIAGNOSIS — M79671 Pain in right foot: Secondary | ICD-10-CM

## 2014-08-29 NOTE — Therapy (Signed)
[facility] [address] [place], [address] Phone: [phone]   Fax:  [phone]  Physical Therapy Evaluation  Patient Details  Name: Lori Carr MRN: [id number] Date of Birth: 1965-09-17 Referring Provider:  Deeann Saint, MD  Encounter Date: 08/29/2014      PT End of Session - 08/29/14 1108    Visit Number 1   Number of Visits 9   Date for PT Re-Evaluation 09/26/14   PT Start Time 0915   PT Stop Time 1000   PT Time Calculation (min) 45 min   Activity Tolerance Patient tolerated treatment well;No increased pain   Behavior During Therapy [facility] for tasks assessed/performed      Past Medical History  Diagnosis Date  . Diabetes mellitus   . Obesity     Past Surgical History  Procedure Laterality Date  . Cesarean section      x3    There were no vitals filed for this visit.  Visit Diagnosis:  Chronic ankle pain, left  Right foot pain  Ankle weakness      Subjective Assessment - 08/29/14 0952    Subjective Patient reports R foot pain on top of foot and left ankle weakness. Pt had sprain and then fracture of L ankle within the last few months.   Limitations Walking   Patient Stated Goals To decrease pain and increase ankle strength   Currently in Pain? Yes   Pain Score 6    Pain Location Foot   Pain Orientation Right   Pain Descriptors / Indicators Constant;Tender;Sharp   Pain Type Acute pain   Pain Onset 1 to 4 weeks ago   Pain Frequency Constant   Multiple Pain Sites No            [facility] PT Assessment - 08/29/14 0001    Assessment   Medical Diagnosis ankle sprain L, foot pain R   Onset Date/Surgical Date 05/02/14   Next MD Visit 6 weeks   Prior Therapy no   Restrictions   Weight Bearing Restrictions No   Balance Screen   Has the patient fallen in the past 6 months No   Has the patient had a decrease in activity level because of a fear of falling?  Yes   Prior Function    Level of Independence Independent   Cognition   Overall Cognitive Status Within Functional Limits for tasks assessed   Observation/Other Assessments   Lower Extremity Functional Scale  34/80   Observation/Other Assessments-Edema    Edema --  slight edema on top of right foot.   Sensation   Light Touch Appears Intact   Single Leg Stance   Comments patient is able to perform SLS bilaterally for approx 6 sec on each side.   Posture/Postural Control   Posture/Postural Control No significant limitations   ROM / Strength   AROM / PROM / Strength AROM;PROM;Strength   AROM   Overall AROM  Within functional limits for tasks performed   Overall AROM Comments R ankle limited in inversion due to pain.   AROM Assessment Site Ankle   Right/Left Ankle Right   PROM   Overall PROM  Within functional limits for tasks performed   Strength   Overall Strength Deficits;Due to pain   Overall Strength Comments pt able to perform heel raise bilaterally in standing.   Palpation   Palpation comment patient has tenderness with right foot on dorsal surface   Ambulation/Gait   Gait  Comments patient demonstrates decreased toe off on right due to pain. apprehensive with stairs due to left ankle weakness.                    [facility] Adult PT Treatment/Exercise - 08/29/14 0001    Exercises   Exercises Ankle   Modalities   Modalities Ultrasound   Ultrasound   Ultrasound Location right dorsal surface of foot   Ultrasound Parameters , 20% pulsed, 1.4 intensity, x 7 min   Ultrasound Goals Edema;Pain   Ankle Exercises: Seated   Towel Crunch 4 reps   Other Seated Ankle Exercises IV/EV, PF/DF with yellow band bilaterally x 10                PT Education - 08/29/14 1107    Education provided Yes   Education Details HEP for ankle exercises with yellow band, AROM, ice, elevation as needed for pain control   Person(s) Educated Patient   Methods Explanation;Demonstration;Verbal cues    Comprehension Verbalized understanding             PT Long Term Goals - 08/29/14 1113    PT LONG TERM GOAL #1   Title Patient will report decreased pain per pain scale of < 2/10.    Baseline 6-7/10   Time 4   Period Weeks   Status New   PT LONG TERM GOAL #2   Title Patient will demonstrate improved mobility with improved LEFS score of > 50/80.   Baseline 34/80   Time 4   Period Weeks   Status New   PT LONG TERM GOAL #3   Title Patient will be independent with HEP for improved B ankle strength and stability.    Time 4   Period Weeks   Status New               Plan - 08/29/14 1109    Clinical Impression Statement Patient is a 49 year old female who sprianed her L ankle in April of 2016 at work. She reports then turning ankle again and says that the MD told her she had a small fracture at that time ( June 2016). Reports her right foot, dorsal surface is tender and swollen now.  Patient has painful right foot with walking /toe off and has significant tenderness with palpation.   Pt will benefit from skilled therapeutic intervention in order to improve on the following deficits Pain;Difficulty walking;Decreased strength;Decreased range of motion;Increased edema   Rehab Potential Good   PT Frequency 2x / week   PT Duration 4 weeks   PT Treatment/Interventions Cryotherapy;Ultrasound;Therapeutic activities;Therapeutic exercise;Balance training;Manual techniques   PT Next Visit Plan ultrasound if patient has continued tenderness, exercise, stability training.   PT Home Exercise Plan ankle exercises with yellow band, toe scrunches with towel, range of motion   Consulted and Agree with Plan of Care Patient         Problem List Patient Active Problem List   Diagnosis Date Noted  . Dupuytren's contracture of foot 06/05/2013  . Diabetes mellitus 09/06/2010    Rushil Kimbrell, PT, MPT, GCS 08/29/2014, 11:18 AM  [facility] PH[facility]ICAL [facility]  SPORTS MEDICINE [address], [place], [address] Phone: [phone]   Fax:  [phone]

## 2014-09-05 ENCOUNTER — Ambulatory Visit: Payer: Worker's Compensation | Attending: Specialist | Admitting: Physical Therapy

## 2014-09-05 DIAGNOSIS — M79671 Pain in right foot: Secondary | ICD-10-CM

## 2014-09-05 DIAGNOSIS — R29898 Other symptoms and signs involving the musculoskeletal system: Secondary | ICD-10-CM

## 2014-09-05 DIAGNOSIS — M259 Joint disorder, unspecified: Secondary | ICD-10-CM | POA: Diagnosis present

## 2014-09-05 DIAGNOSIS — M25572 Pain in left ankle and joints of left foot: Secondary | ICD-10-CM | POA: Diagnosis not present

## 2014-09-05 DIAGNOSIS — G8929 Other chronic pain: Secondary | ICD-10-CM | POA: Diagnosis present

## 2014-09-05 NOTE — Therapy (Signed)
[facility] [address] [place], [address] Phone: [phone]   Fax:  [phone]  Physical Therapy Treatment  Patient Details  Name: Lori Carr MRN: [id number] Date of Birth: 1965/10/31 Referring Provider:  Earnestine Leys, MD  Encounter Date: 09/05/2014      PT End of Session - 09/05/14 1234    Visit Number 2   Number of Visits 9   Date for PT Re-Evaluation 09/26/14   PT Start Time 1140   PT Stop Time 1230   PT Time Calculation (min) 50 min   Activity Tolerance Patient tolerated treatment well;No increased pain   Behavior During Therapy [facility] for tasks assessed/performed      Past Medical History  Diagnosis Date  . Diabetes mellitus   . Obesity     Past Surgical History  Procedure Laterality Date  . Cesarean section      x3    There were no vitals filed for this visit.  Visit Diagnosis:  Chronic ankle pain, left  Right foot pain  Ankle weakness      Subjective Assessment - 09/05/14 1231    Subjective Patient reports continued pain on top of right foot between 1st and 2nd met, L ankle swelling and some pain.    Limitations Walking   Patient Stated Goals To decrease pain and increase ankle strength   Currently in Pain? Yes   Pain Score 5    Pain Location Foot   Pain Orientation Right   Pain Descriptors / Indicators Tender;Sore;Constant;Shooting   Pain Type Acute pain   Pain Onset 1 to 4 weeks ago   Pain Frequency Constant   Multiple Pain Sites No        treatment to include.  Right foot STM and trigger point release between 1st and 2nd met. Toe flexion/extension STM to left ankle for edema management. Exercises as follows: Heel raises in 3 planes x20 each, Tilt board in AP and lateral directions for increased rom and  Holding static for stability training x 1 min each. SLS on blue foam x 30 seconds with minimal hha.  Heel/toe walking 100'x2 Ankle ABCs with left x 1  rep Towel scrunches bilaterally x 2 min.            PT Education - 09/05/14 1234    Education provided Yes   Education Details plan of care, exercises, possible treatment options   Person(s) Educated Patient   Methods Explanation   Comprehension Verbalized understanding             PT Long Term Goals - 08/29/14 1113    PT LONG TERM GOAL #1   Title Patient will report decreased pain per pain scale of < 2/10.    Baseline 6-7/10   Time 4   Period Weeks   Status New   PT LONG TERM GOAL #2   Title Patient will demonstrate improved mobility with improved LEFS score of > 50/80.   Baseline 34/80   Time 4   Period Weeks   Status New   PT LONG TERM GOAL #3   Title Patient will be independent with HEP for improved B ankle strength and stability.    Time 4   Period Weeks   Status New               Plan - 09/05/14 1235    Clinical Impression Statement Patient continues to reports significant pain on top of right foot. limiting  her ability to walk. Pt has tenderness with palpation between 1st and 2nd met. Continued swelling of left ankle after injuries.    Pt will benefit from skilled therapeutic intervention in order to improve on the following deficits Pain;Difficulty walking;Decreased strength;Decreased range of motion;Increased edema   Rehab Potential Good   PT Frequency 2x / week   PT Duration 4 weeks   PT Treatment/Interventions Cryotherapy;Ultrasound;Therapeutic activities;Therapeutic exercise;Balance training;Manual techniques   PT Next Visit Plan ultrasound if patient has continued tenderness, exercise, stability training.   PT Home Exercise Plan ankle exercises with yellow band, toe scrunches with towel, range of motion   Consulted and Agree with Plan of Care Patient        Problem List Patient Active Problem List   Diagnosis Date Noted  . Dupuytren's contracture of foot 06/05/2013  . Diabetes mellitus 09/06/2010    Azlynn Mitnick, PT, MPT,  GCS 09/05/2014, 12:38 PM  [facility] [address] [place], [address] Phone: [phone]   Fax:  [phone]

## 2014-09-08 ENCOUNTER — Encounter: Payer: Self-pay | Admitting: Physical Therapy

## 2014-09-08 ENCOUNTER — Ambulatory Visit: Payer: Worker's Compensation | Admitting: Physical Therapy

## 2014-09-08 DIAGNOSIS — M25572 Pain in left ankle and joints of left foot: Secondary | ICD-10-CM | POA: Diagnosis not present

## 2014-09-08 DIAGNOSIS — G8929 Other chronic pain: Secondary | ICD-10-CM

## 2014-09-08 DIAGNOSIS — M79671 Pain in right foot: Secondary | ICD-10-CM

## 2014-09-08 DIAGNOSIS — R29898 Other symptoms and signs involving the musculoskeletal system: Secondary | ICD-10-CM

## 2014-09-08 NOTE — Therapy (Signed)
[facility] [address] [place], [address] Phone: [phone]   Fax:  [phone]  Physical Therapy Treatment  Patient Details  Name: Lori Carr MRN: [id number] Date of Birth: Mar 02, 1965 Referring Provider:  Earnestine Leys, MD  Encounter Date: 09/08/2014      PT End of Session - 09/08/14 1137    Visit Number 3   Number of Visits 9   Date for PT Re-Evaluation 09/26/14   PT Start Time 0900   PT Stop Time 0945   PT Time Calculation (min) 45 min   Activity Tolerance Patient tolerated treatment well;Patient limited by pain   Behavior During Therapy [facility] for tasks assessed/performed      Past Medical History  Diagnosis Date  . Diabetes mellitus   . Obesity     Past Surgical History  Procedure Laterality Date  . Cesarean section      x3    There were no vitals filed for this visit.  Visit Diagnosis:  Chronic ankle pain, left  Right foot pain  Ankle weakness      Subjective Assessment - 09/08/14 1135    Subjective Patient continues to reports tenderness over top of right foot to palpation. Altered gait due to pain. Pt reports it had been feeling better over the weekend after last session.    Limitations Walking   Patient Stated Goals To decrease pain and increase ankle strength   Currently in Pain? Yes   Pain Score 7    Pain Location Foot   Pain Orientation Right   Pain Descriptors / Indicators Sore;Tender;Shooting   Pain Type Acute pain   Pain Onset 1 to 4 weeks ago   Pain Frequency Constant   Multiple Pain Sites No        Treatment: Massage to right foot between 1st and 2nd met. Toe flexion/extension Standing exercises to include: heel raises 3 ways, toe/heel walking x 100' each Heel cord stretch x 30 sec bilaterally. Right SLS x 1 min on blue foam  Tilt board AP and lateral first for stretching, then for balance x 10 min each.            PT Education - 09/08/14 1137     Education provided Yes   Education Details POC, possible tDN next session, exercises.    Person(s) Educated Patient   Methods Explanation   Comprehension Verbalized understanding             PT Long Term Goals - 08/29/14 1113    PT LONG TERM GOAL #1   Title Patient will report decreased pain per pain scale of < 2/10.    Baseline 6-7/10   Time 4   Period Weeks   Status New   PT LONG TERM GOAL #2   Title Patient will demonstrate improved mobility with improved LEFS score of > 50/80.   Baseline 34/80   Time 4   Period Weeks   Status New   PT LONG TERM GOAL #3   Title Patient will be independent with HEP for improved B ankle strength and stability.    Time 4   Period Weeks   Status New               Plan - 09/08/14 1138    Clinical Impression Statement Patient continues to have tenderness with palpation and gait on right foot. Pt may benefit from dry needling next session. Pt continuing to work on balance and strength  of B ankles and feet.    Pt will benefit from skilled therapeutic intervention in order to improve on the following deficits Pain;Difficulty walking;Decreased strength;Decreased range of motion;Increased edema   Rehab Potential Good   PT Frequency 2x / week   PT Duration 4 weeks   PT Treatment/Interventions Cryotherapy;Ultrasound;Therapeutic activities;Therapeutic exercise;Balance training;Manual techniques   PT Next Visit Plan ultrasound if patient has continued tenderness, exercise, stability training.   PT Home Exercise Plan ankle exercises with yellow band, toe scrunches with towel, range of motion   Consulted and Agree with Plan of Care Patient        Problem List Patient Active Problem List   Diagnosis Date Noted  . Dupuytren's contracture of foot 06/05/2013  . Diabetes mellitus 09/06/2010    Harrie Cazarez, PT, MPT, GCS 09/08/2014, 11:40 AM  [place] [facility] [address] [place], [address] Phone: [phone]   Fax:  [phone]

## 2014-09-12 ENCOUNTER — Ambulatory Visit: Payer: Worker's Compensation | Admitting: Physical Therapy

## 2014-09-12 ENCOUNTER — Encounter: Payer: Self-pay | Admitting: Physical Therapy

## 2014-09-12 DIAGNOSIS — R29898 Other symptoms and signs involving the musculoskeletal system: Secondary | ICD-10-CM

## 2014-09-12 DIAGNOSIS — G8929 Other chronic pain: Secondary | ICD-10-CM

## 2014-09-12 DIAGNOSIS — M79671 Pain in right foot: Secondary | ICD-10-CM

## 2014-09-12 DIAGNOSIS — M25572 Pain in left ankle and joints of left foot: Principal | ICD-10-CM

## 2014-09-12 NOTE — Therapy (Signed)
[facility] [address], [place], [address] Phone: [phone]   Fax:  [phone]  Physical Therapy Treatment  Patient Details  Name: Lori Carr MRN: [id number] Date of Birth: 21-Aug-1965 Referring Provider:  Earnestine Leys, MD  Encounter Date: 09/12/2014      PT End of Session - 09/12/14 0941    Visit Number 4   Number of Visits 9   Date for PT Re-Evaluation 09/26/14   PT Start Time 0900   PT Stop Time 0940   PT Time Calculation (min) 40 min   Activity Tolerance Patient tolerated treatment well;No increased pain   Behavior During Therapy [facility] for tasks assessed/performed      Past Medical History  Diagnosis Date  . Diabetes mellitus   . Obesity     Past Surgical History  Procedure Laterality Date  . Cesarean section      x3    There were no vitals filed for this visit.  Visit Diagnosis:  Chronic ankle pain, left  Right foot pain  Ankle weakness      Subjective Assessment - 09/12/14 0939    Subjective Patient reports improvement of pain in right foot and reduced swelling in left ankle.   Limitations Walking   Patient Stated Goals To decrease pain and increase ankle strength   Currently in Pain? Yes   Pain Score 5    Pain Location Foot   Pain Orientation Right   Pain Descriptors / Indicators Tender   Pain Type Acute pain   Pain Onset 1 to 4 weeks ago   Pain Frequency Intermittent   Multiple Pain Sites No            Treatment: Massage to right foot between 1st and 2nd met. Toe flexion/extension Standing exercises to include: heel raises 3 ways, toe/heel walking x 100' each Heel cord stretch2 x 30 sec bilaterally. Right SLS x 1 min on blue foam bilaterally  Star balance/stability training bilaterally x 5 reps each                        PT Education - 09/12/14 0940    Education provided Yes   Education Details exercises, to continue with band  exercises at home.    Person(s) Educated Patient   Methods Explanation   Comprehension Verbalized understanding             PT Long Term Goals - 08/29/14 1113    PT LONG TERM GOAL #1   Title Patient will report decreased pain per pain scale of < 2/10.    Baseline 6-7/10   Time 4   Period Weeks   Status New   PT LONG TERM GOAL #2   Title Patient will demonstrate improved mobility with improved LEFS score of > 50/80.   Baseline 34/80   Time 4   Period Weeks   Status New   PT LONG TERM GOAL #3   Title Patient will be independent with HEP for improved B ankle strength and stability.    Time 4   Period Weeks   Status New               Plan - 09/12/14 8182    Clinical Impression Statement Patient is making good progress with strength and pain reduction.    Pt will benefit from skilled therapeutic intervention in order to improve on the following deficits Pain;Difficulty walking;Decreased strength;Decreased range of motion;Increased  edema   Rehab Potential Good   PT Frequency 2x / week   PT Duration 4 weeks   PT Treatment/Interventions Cryotherapy;Ultrasound;Therapeutic activities;Therapeutic exercise;Balance training;Manual techniques   PT Next Visit Plan ultrasound if patient has continued tenderness, exercise, stability training.   PT Home Exercise Plan ankle exercises with yellow band, toe scrunches with towel, range of motion   Consulted and Agree with Plan of Care Patient        Problem List Patient Active Problem List   Diagnosis Date Noted  . Dupuytren's contracture of foot 06/05/2013  . Diabetes mellitus 09/06/2010    Conetta,Kristyn, PT, MPT 09/12/2014, 9:44 AM  [facility] [address] [place], [address] Phone: [phone]   Fax:  [phone]

## 2014-09-15 ENCOUNTER — Encounter: Payer: Self-pay | Admitting: Physical Therapy

## 2014-09-22 ENCOUNTER — Encounter: Payer: Self-pay | Admitting: Physical Therapy

## 2014-09-22 ENCOUNTER — Ambulatory Visit: Payer: Worker's Compensation | Admitting: Physical Therapy

## 2014-09-22 DIAGNOSIS — M25572 Pain in left ankle and joints of left foot: Principal | ICD-10-CM

## 2014-09-22 DIAGNOSIS — M79671 Pain in right foot: Secondary | ICD-10-CM

## 2014-09-22 DIAGNOSIS — G8929 Other chronic pain: Secondary | ICD-10-CM

## 2014-09-22 DIAGNOSIS — R29898 Other symptoms and signs involving the musculoskeletal system: Secondary | ICD-10-CM

## 2014-09-22 NOTE — Therapy (Signed)
[place] [facility] [address], [place], [address] Phone: [phone]   Fax:  [phone]  Physical Therapy Treatment  Patient Details  Name: Lori Carr MRN: [id number] Date of Birth: 12/08/1965 Referring Provider:  Deeann Saint, MD  Encounter Date: 09/22/2014      PT End of Session - 09/22/14 1021    Visit Number 5   Number of Visits 9   Date for PT Re-Evaluation 09/26/14   PT Start Time 0915   PT Stop Time 0946   PT Time Calculation (min) 31 min   Activity Tolerance Patient tolerated treatment well;No increased pain   Behavior During Therapy [facility] for tasks assessed/performed      Past Medical History  Diagnosis Date  . Diabetes mellitus   . Obesity     Past Surgical History  Procedure Laterality Date  . Cesarean section      x3    There were no vitals filed for this visit.  Visit Diagnosis:  Chronic ankle pain, left  Right foot pain  Ankle weakness      Subjective Assessment - 09/22/14 1018    Subjective Patient reports improvement in pain of right foot. Continues to have some pain to palpation, but this has improved pain and improved swelling in B feet/ankle.    Limitations Walking   Patient Stated Goals To decrease pain and increase ankle strength   Currently in Pain? No/denies   Pain Location Foot   Pain Orientation Right   Pain Descriptors / Indicators Tender   Pain Type Chronic pain   Pain Onset 1 to 4 weeks ago   Pain Frequency Intermittent   Multiple Pain Sites No         Treatment this session ( shortened due to patient late to appointment) STM to right foot between mets for trigger points, left ankle STM for edema management.  Standing exercises to include: heel raises 3 ways, toe/heel walking x 100' each  Heel cord stretch2 x 30 sec bilaterally.  Right SLS x 1 min on blue foam bilaterally  Star balance/stability training bilaterally x 5 reps each            PT Education - 09/22/14 1021    Education provided Yes   Education Details exercises, progression   Person(s) Educated Patient   Methods Explanation;Demonstration   Comprehension Verbalized understanding;Returned demonstration             PT Long Term Goals - 08/29/14 1113    PT LONG TERM GOAL #1   Title Patient will report decreased pain per pain scale of < 2/10.    Baseline 6-7/10   Time 4   Period Weeks   Status New   PT LONG TERM GOAL #2   Title Patient will demonstrate improved mobility with improved LEFS score of > 50/80.   Baseline 34/80   Time 4   Period Weeks   Status New   PT LONG TERM GOAL #3   Title Patient will be independent with HEP for improved B ankle strength and stability.    Time 4   Period Weeks   Status New               Plan - 09/22/14 1022    Clinical Impression Statement Patient is mkaing steady progress with pain, strength of B ankles and functional mobility.    Pt will benefit from skilled therapeutic intervention in order to improve on the following deficits  Pain;Difficulty walking;Decreased strength;Decreased range of motion;Increased edema   Rehab Potential Good   PT Frequency 2x / week   PT Duration 4 weeks   PT Treatment/Interventions Cryotherapy;Ultrasound;Therapeutic activities;Therapeutic exercise;Balance training;Manual techniques   PT Next Visit Plan ultrasound if patient has continued tenderness, exercise, stability training.   Consulted and Agree with Plan of Care Patient        Problem List Patient Active Problem List   Diagnosis Date Noted  . Dupuytren's contracture of foot 06/05/2013  . Diabetes mellitus 09/06/2010    Allyn Bertoni, PT, MPT 09/22/2014, 10:24 AM  [place] [facility] [address], [place], [address] Phone: [phone]   Fax:  [phone]

## 2014-09-26 ENCOUNTER — Encounter: Payer: Self-pay | Admitting: Physical Therapy

## 2014-09-26 ENCOUNTER — Ambulatory Visit: Payer: Worker's Compensation | Admitting: Physical Therapy

## 2014-09-26 DIAGNOSIS — G8929 Other chronic pain: Secondary | ICD-10-CM

## 2014-09-26 DIAGNOSIS — M25572 Pain in left ankle and joints of left foot: Principal | ICD-10-CM

## 2014-09-26 DIAGNOSIS — R29898 Other symptoms and signs involving the musculoskeletal system: Secondary | ICD-10-CM

## 2014-09-26 NOTE — Therapy (Signed)
[place] [facility] [address], [place], [address] Phone: [phone]   Fax:  [phone]  Physical Therapy Treatment  Patient Details  Name: Lori Carr MRN: [id number] Date of Birth: November 21, 1965 Referring Provider:  Deeann Saint, MD  Encounter Date: 09/26/2014      PT End of Session - 09/26/14 1324    Visit Number 6   Number of Visits 9   Date for PT Re-Evaluation 09/26/14   PT Start Time 0915   PT Stop Time 0945   PT Time Calculation (min) 30 min   Activity Tolerance No increased pain;Patient tolerated treatment well   Behavior During Therapy [facility] for tasks assessed/performed      Past Medical History  Diagnosis Date  . Diabetes mellitus   . Obesity     Past Surgical History  Procedure Laterality Date  . Cesarean section      x3    There were no vitals filed for this visit.  Visit Diagnosis:  Chronic ankle pain, left  Ankle weakness      Subjective Assessment - 09/26/14 1323    Subjective Patient reports she is feeling better. Has no pain when just walking only has pain with palpation on right foot.    Patient Stated Goals To decrease pain and increase ankle strength   Currently in Pain? No/denies        Treatment this session ( shortened due to patient late to appointment) STM to right foot between mets for trigger points, left ankle STM for edema management.  Standing exercises to include: heel raises 3 ways, toe/heel walking x 150' each  Heel cord stretch2 x 30 sec bilaterally.  Right SLS x 1 min on blue foam bilaterally  Star balance/stability training bilaterally x 5 reps each  Single leg stance while throwing small ball at rebounder x 1 min each side.              PT Education - 09/26/14 1324    Education provided Yes   Education Details progression   Person(s) Educated Patient   Methods Explanation   Comprehension Verbalized understanding              PT Long Term Goals - 08/29/14 1113    PT LONG TERM GOAL #1   Title Patient will report decreased pain per pain scale of < 2/10.    Baseline 6-7/10   Time 4   Period Weeks   Status New   PT LONG TERM GOAL #2   Title Patient will demonstrate improved mobility with improved LEFS score of > 50/80.   Baseline 34/80   Time 4   Period Weeks   Status New   PT LONG TERM GOAL #3   Title Patient will be independent with HEP for improved B ankle strength and stability.    Time 4   Period Weeks   Status New               Plan - 09/26/14 1325    Clinical Impression Statement Patient is making good progress. Reports no pain with daily activities on right foot. has tenderness to palpation but less than initially per her report.    Pt will benefit from skilled therapeutic intervention in order to improve on the following deficits Pain;Difficulty walking;Decreased strength;Decreased range of motion;Increased edema   Rehab Potential Good   PT Duration 4 weeks   PT Treatment/Interventions Cryotherapy;Ultrasound;Therapeutic activities;Therapeutic exercise;Balance training;Manual techniques   PT Home  Exercise Plan ankle exercises with yellow band, toe scrunches with towel, range of motion   Consulted and Agree with Plan of Care Patient        Problem List Patient Active Problem List   Diagnosis Date Noted  . Dupuytren's contracture of foot 06/05/2013  . Diabetes mellitus 09/06/2010    Suzann Lazaro, PT, MPT 09/26/2014, 1:28 PM  [place] [facility] [address], [place], [address] Phone: [phone]   Fax:  [phone]

## 2014-09-29 ENCOUNTER — Ambulatory Visit: Payer: Worker's Compensation | Admitting: Physical Therapy

## 2014-09-29 DIAGNOSIS — R29898 Other symptoms and signs involving the musculoskeletal system: Secondary | ICD-10-CM

## 2014-09-29 DIAGNOSIS — G8929 Other chronic pain: Secondary | ICD-10-CM

## 2014-09-29 DIAGNOSIS — M25572 Pain in left ankle and joints of left foot: Principal | ICD-10-CM

## 2014-09-29 NOTE — Therapy (Signed)
[place] [facility] [address], [place], [address] Phone: [phone]   Fax:  [phone]  Physical Therapy Treatment  Patient Details  Name: Lori Carr MRN: [id number] Date of Birth: 26-Nov-1965 Referring Provider:  Earnestine Leys, MD  Encounter Date: 09/29/2014      PT End of Session - 09/29/14 0951    Visit Number 1   Number of Visits 9   Date for PT Re-Evaluation 10/27/14   PT Start Time 0915   PT Stop Time 0945   PT Time Calculation (min) 30 min   Activity Tolerance Patient tolerated treatment well;No increased pain   Behavior During Therapy [facility] for tasks assessed/performed      Past Medical History  Diagnosis Date  . Diabetes mellitus   . Obesity     Past Surgical History  Procedure Laterality Date  . Cesarean section      x3    There were no vitals filed for this visit.  Visit Diagnosis:  Chronic ankle pain, left  Ankle weakness      Subjective Assessment - 09/29/14 0948    Subjective Patient reports that her ankles were sore over the weekend.   Limitations Walking   Patient Stated Goals To decrease pain and increase ankle strength   Currently in Pain? Yes   Pain Score 2    Pain Location Ankle   Pain Orientation Left   Pain Descriptors / Indicators Sore   Pain Type Chronic pain   Pain Onset 1 to 4 weeks ago   Pain Frequency Intermittent   Multiple Pain Sites No        Treatment: Manual therapy for pain reduction and joint mobility as well as edema management. Standing heel raises in 3 planes x 20 Heel /toe walking x 2 laps of 100' Star taps x 10 bilaterally,  SLS on blue foam x 2 min Narrow stance on blue foam x 2 min SLS while throwing small ball to rebounder 2x30 sec each side.           PT Education - 09/29/14 [phone]    Education provided No   Education Details that she may have increased soreness with muscle strengthening.    Person(s) Educated Patient   Methods Explanation   Comprehension Verbalized understanding             PT Long Term Goals - 09/29/14 0953    PT LONG TERM GOAL #1   Title Patient will report decreased pain per pain scale of < 2/10.    Baseline 6-7/10   Time 4   Period Weeks   Status Achieved   PT LONG TERM GOAL #2   Title Patient will demonstrate improved mobility with improved LEFS score of > 50/80.   Baseline 34/80   Time 4   Period Weeks   Status Partially Met   PT LONG TERM GOAL #3   Title Patient will be independent with HEP for improved B ankle strength and stability.    Time 4   Period Weeks   Status Achieved               Plan - 09/29/14 0102    Clinical Impression Statement Patient is reporting decreased pain, she continues to require ankle strengthnening for improved safety with mobility.    Pt will benefit from skilled therapeutic intervention in order to improve on the following deficits Pain;Difficulty walking;Decreased strength;Decreased range of motion;Increased edema   Rehab Potential Good  PT Frequency 2x / week   PT Duration 4 weeks   PT Treatment/Interventions Cryotherapy;Ultrasound;Therapeutic activities;Therapeutic exercise;Balance training;Manual techniques   PT Home Exercise Plan ankle exercises with yellow band, toe scrunches with towel, range of motion   Consulted and Agree with Plan of Care Patient        Problem List Patient Active Problem List   Diagnosis Date Noted  . Dupuytren's contracture of foot 06/05/2013  . Diabetes mellitus 09/06/2010    Devani Odonnel, PT, MPT 09/29/2014, 9:55 AM  [place] [facility] [address], [place], [address] Phone: [phone]   Fax:  [phone]

## 2014-10-03 ENCOUNTER — Ambulatory Visit: Payer: Worker's Compensation | Attending: Specialist | Admitting: Physical Therapy

## 2014-10-03 DIAGNOSIS — G8929 Other chronic pain: Secondary | ICD-10-CM | POA: Insufficient documentation

## 2014-10-03 DIAGNOSIS — M25572 Pain in left ankle and joints of left foot: Secondary | ICD-10-CM | POA: Diagnosis present

## 2014-10-03 DIAGNOSIS — R29898 Other symptoms and signs involving the musculoskeletal system: Secondary | ICD-10-CM

## 2014-10-03 DIAGNOSIS — M79671 Pain in right foot: Secondary | ICD-10-CM | POA: Insufficient documentation

## 2014-10-03 DIAGNOSIS — M259 Joint disorder, unspecified: Secondary | ICD-10-CM | POA: Diagnosis not present

## 2014-10-03 NOTE — Therapy (Signed)
[place] [address] [place] [facility] [address], [place], [address] Phone: [phone]   Fax:  [phone]  Physical Therapy Treatment  Patient Details  Name: Lori Carr MRN: [id number] Date of Birth: 04-03-1965 Referring Provider:  Deeann Saint, MD  Encounter Date: 10/03/2014      PT End of Session - 10/03/14 1036    Visit Number 2   Number of Visits 9   Date for PT Re-Evaluation 10/27/14   PT Start Time 0815   PT Stop Time 0845   PT Time Calculation (min) 30 min   Activity Tolerance Patient tolerated treatment well;No increased pain   Behavior During Therapy [facility] for tasks assessed/performed      Past Medical History  Diagnosis Date  . Diabetes mellitus   . Obesity     Past Surgical History  Procedure Laterality Date  . Cesarean section      x3    There were no vitals filed for this visit.  Visit Diagnosis:  Ankle weakness      Subjective Assessment - 10/03/14 1035    Subjective Patient arrived 15 min. late due to school traffic. Reports she is doing good. Reports 0/10 pain.    Limitations Walking   Patient Stated Goals To decrease pain and increase ankle strength   Currently in Pain? No/denies         Treatment:  Standing heel raises in 3 planes x 20 Heel /toe walking x 2 laps of 100' Star taps x 10 bilaterally,  SLS on blue foam x 2 min Narrow stance on BOSU x 2 min SLS while throwing small ball to rebounder x 1 min sec each side.             PT Education - 10/03/14 1036    Education provided Yes   Education Details new activities   Person(s) Educated Patient   Methods Explanation;Demonstration   Comprehension Verbalized understanding;Returned demonstration;Verbal cues required             PT Long Term Goals - 10/03/14 1038    PT LONG TERM GOAL #1   Title Patient will report decreased pain per pain scale of < 2/10.    Baseline 6-7/10   Time 4   Period Weeks   Status  Achieved   PT LONG TERM GOAL #2   Title Patient will demonstrate improved mobility with improved LEFS score of > 50/80.   Baseline 34/80   Time 4   Period Weeks   PT LONG TERM GOAL #3   Title Patient will be independent with HEP for improved B ankle strength and stability.    Time 4   Period Weeks   Status Achieved   PT LONG TERM GOAL #4   Title Patient will demonstrate improved ankle strength without pain during dynamic activities.    Time 2   Period Weeks   Status New               Plan - 10/03/14 1037    Clinical Impression Statement Patient has much improved pain reporting, improved strength and will continue to benefit from ankle strengthening for prevention of further ankle injuries.    Pt will benefit from skilled therapeutic intervention in order to improve on the following deficits Pain;Difficulty walking;Decreased strength;Decreased range of motion;Increased edema   Rehab Potential Good   PT Frequency 2x / week   PT Duration 4 weeks   PT Treatment/Interventions Cryotherapy;Ultrasound;Therapeutic activities;Therapeutic exercise;Balance training;Manual techniques  PT Next Visit Plan ultrasound if patient has continued tenderness, exercise, stability training.   PT Home Exercise Plan ankle exercises with yellow band, toe scrunches with towel, range of motion   Consulted and Agree with Plan of Care Patient        Problem List Patient Active Problem List   Diagnosis Date Noted  . Dupuytren's contracture of foot 06/05/2013  . Diabetes mellitus 09/06/2010    Ahleah Simko, PT, MPT 10/03/2014, 10:41 AM  [place] [facility] [address] [place], [address] Phone: [phone]   Fax:  [phone]

## 2014-10-09 ENCOUNTER — Ambulatory Visit: Payer: Worker's Compensation | Admitting: Physical Therapy

## 2014-10-09 DIAGNOSIS — R29898 Other symptoms and signs involving the musculoskeletal system: Secondary | ICD-10-CM

## 2014-10-09 DIAGNOSIS — M79671 Pain in right foot: Secondary | ICD-10-CM

## 2014-10-09 DIAGNOSIS — M259 Joint disorder, unspecified: Secondary | ICD-10-CM | POA: Diagnosis not present

## 2014-10-09 NOTE — Therapy (Signed)
[place] [facility] [address], [place], [address] Phone: [phone]   Fax:  [phone]  Physical Therapy Treatment  Patient Details  Name: Lori Carr MRN: [id number] Date of Birth: 07/14/1965 Referring Provider:  Deeann Saint, MD  Encounter Date: 10/09/2014      PT End of Session - 10/09/14 0903    Visit Number 3   Number of Visits 9   Date for PT Re-Evaluation 10/27/14   PT Start Time 0815   PT Stop Time 0900   PT Time Calculation (min) 45 min   Activity Tolerance Patient tolerated treatment well;No increased pain   Behavior During Therapy [facility] for tasks assessed/performed      Past Medical History  Diagnosis Date  . Diabetes mellitus   . Obesity     Past Surgical History  Procedure Laterality Date  . Cesarean section      x3    There were no vitals filed for this visit.  Visit Diagnosis:  Ankle weakness  Right foot pain      Subjective Assessment - 10/09/14 0818    Subjective Pt reports 0/10 pain, she had pain approximately 1 wk ago. Pt reports she is doing her exercises.   Currently in Pain? No/denies           Objective: Three way active PF 3x10. Cuing to minimize knee flexion.  Rocker board x3 min ankle PF/DF focusing on neutral posture.  Rocker board turned sidways focusing on improving control with ankle inversion/eversion with cuing to limit motion to where pt feels good control. Pt had significant difficulty trusting her foot with this, required encouragement and only able to achieve minimal excursion. Reported some R knee pain with this on R which limited performance on R side.  Resisted DF isometrics in multiple angles 3x6 with 6 sec. Holds. Same parameters for inversion.  Gait in clinic with cuing to minimize foot eversion.                      PT Education - 10/09/14 2841    Education provided Yes             PT Long Term Goals -  10/03/14 1038    PT LONG TERM GOAL #1   Title Patient will report decreased pain per pain scale of < 2/10.    Baseline 6-7/10   Time 4   Period Weeks   Status Achieved   PT LONG TERM GOAL #2   Title Patient will demonstrate improved mobility with improved LEFS score of > 50/80.   Baseline 34/80   Time 4   Period Weeks   PT LONG TERM GOAL #3   Title Patient will be independent with HEP for improved B ankle strength and stability.    Time 4   Period Weeks   Status Achieved   PT LONG TERM GOAL #4   Title Patient will demonstrate improved ankle strength without pain during dynamic activities.    Time 2   Period Weeks   Status New               Plan - 10/09/14 0904    Clinical Impression Statement Pt reports no pain, she is continuign to improve strengthening but does demonstrate some focal weakness in peroneals and with resisted DF.    Pt will benefit from skilled therapeutic intervention in order to improve on the following deficits Pain;Difficulty walking;Decreased strength;Decreased range of motion;Increased  edema   Rehab Potential Good   PT Frequency 2x / week   PT Duration 4 weeks   PT Treatment/Interventions Cryotherapy;Ultrasound;Therapeutic activities;Therapeutic exercise;Balance training;Manual techniques   PT Next Visit Plan ultrasound if patient has continued tenderness, exercise, stability training.        Problem List Patient Active Problem List   Diagnosis Date Noted  . Dupuytren's contracture of foot 06/05/2013  . Diabetes mellitus 09/06/2010    Fisher,Benjamin 10/09/2014, 9:10 AM  [place] [facility] [address] [place], [address] Phone: [phone]   Fax:  [phone]

## 2014-10-13 ENCOUNTER — Ambulatory Visit: Payer: Worker's Compensation | Admitting: Physical Therapy

## 2014-10-13 DIAGNOSIS — R29898 Other symptoms and signs involving the musculoskeletal system: Secondary | ICD-10-CM

## 2014-10-13 DIAGNOSIS — G8929 Other chronic pain: Secondary | ICD-10-CM

## 2014-10-13 DIAGNOSIS — M25572 Pain in left ankle and joints of left foot: Secondary | ICD-10-CM

## 2014-10-13 DIAGNOSIS — M259 Joint disorder, unspecified: Secondary | ICD-10-CM | POA: Diagnosis not present

## 2014-10-13 NOTE — Therapy (Signed)
[place] [facility] [address], [place], [address] Phone: [phone]   Fax:  [phone]  Physical Therapy Treatment  Patient Details  Name: Lori Carr MRN: [id number] Date of Birth: January 18, 1966 Referring Provider:  Deeann Saint, MD  Encounter Date: 10/13/2014      PT End of Session - 10/13/14 1057    Visit Number 4   Number of Visits 9   Date for PT Re-Evaluation 10/27/14   PT Start Time 1015   PT Stop Time 1050   PT Time Calculation (min) 35 min   Activity Tolerance Patient tolerated treatment well;No increased pain   Behavior During Therapy [facility] for tasks assessed/performed      Past Medical History  Diagnosis Date  . Diabetes mellitus   . Obesity     Past Surgical History  Procedure Laterality Date  . Cesarean section      x3    There were no vitals filed for this visit.  Visit Diagnosis:  Ankle weakness  Chronic ankle pain, left      Subjective Assessment - 10/13/14 1056    Subjective Patient reports her left ankle has been sore, but not really painful. No pain reported in right foot at this time.    Patient Stated Goals To decrease pain and increase ankle strength   Currently in Pain? No/denies          Treatment: Heel/ toe walking x 100' each Heel raises 3 ways x 20 reps  TB exercises in EV/IV PF/DF with green band. Instructed patient to perform these at home, green band given to pt.  SLS on BOSU ball x 1 min each side with hha as needed for ankle strengthening. Star taps x 10 on each side for improved stability and range of motion.  Ball toss at [employer] x 1 min each side in SLS for improved dynamic control of bilateral ankles. Slight pain on left toward end of this activity. Tilt board in lateral and AP directions for ankle stretch and then maintaining level for balance and stability training x 1 min each.  Joint mobs of Left ankle at talar/tibial joint, edema management.                PT Education - 10/13/14 1057    Education provided Yes   Education Details proper form with exercises.    Person(s) Educated Patient   Methods Explanation;Demonstration   Comprehension Verbalized understanding;Returned demonstration             PT Long Term Goals - 10/03/14 1038    PT LONG TERM GOAL #1   Title Patient will report decreased pain per pain scale of < 2/10.    Baseline 6-7/10   Time 4   Period Weeks   Status Achieved   PT LONG TERM GOAL #2   Title Patient will demonstrate improved mobility with improved LEFS score of > 50/80.   Baseline 34/80   Time 4   Period Weeks   PT LONG TERM GOAL #3   Title Patient will be independent with HEP for improved B ankle strength and stability.    Time 4   Period Weeks   Status Achieved   PT LONG TERM GOAL #4   Title Patient will demonstrate improved ankle strength without pain during dynamic activities.    Time 2   Period Weeks   Status New  Plan - 10/13/14 1058    Clinical Impression Statement patient is making good progress toward goals, improved strength and decreased reports of pain.     Pt will benefit from skilled therapeutic intervention in order to improve on the following deficits Pain;Difficulty walking;Decreased strength;Decreased range of motion;Increased edema   Rehab Potential Good   PT Frequency 2x / week   PT Duration 4 weeks   PT Treatment/Interventions Cryotherapy;Ultrasound;Therapeutic activities;Therapeutic exercise;Balance training;Manual techniques   PT Next Visit Plan ultrasound if patient has continued tenderness, exercise, stability training.   PT Home Exercise Plan increased resistance and gave pt green band for ankle exercises at home.    Consulted and Agree with Plan of Care Patient        Problem List Patient Active Problem List   Diagnosis Date Noted  . Dupuytren's contracture of foot 06/05/2013  . Diabetes mellitus 09/06/2010     Zyden Suman, PT, MPT 10/13/2014, 11:01 AM  [place] [facility] [address], [place], [address] Phone: [phone]   Fax:  [phone]

## 2014-10-17 ENCOUNTER — Ambulatory Visit: Payer: Worker's Compensation | Admitting: Physical Therapy

## 2014-10-17 DIAGNOSIS — M79671 Pain in right foot: Secondary | ICD-10-CM

## 2014-10-17 DIAGNOSIS — M25572 Pain in left ankle and joints of left foot: Secondary | ICD-10-CM

## 2014-10-17 DIAGNOSIS — M259 Joint disorder, unspecified: Secondary | ICD-10-CM | POA: Diagnosis not present

## 2014-10-17 DIAGNOSIS — G8929 Other chronic pain: Secondary | ICD-10-CM

## 2014-10-17 DIAGNOSIS — R29898 Other symptoms and signs involving the musculoskeletal system: Secondary | ICD-10-CM

## 2014-10-17 NOTE — Patient Instructions (Addendum)
All exercises provided were adapted from [email or web address]. Patient was provided a written handout with pictures as described. Any additional cues were manually entered in to handout and copied in to this document.  All exercises for 2 sets of 10 for 3x per week  SIT TO STAND - NO HANDS  Start by sitting in a chair. Next, raise up to standing without using your hands for support.    Single Leg Deadlift  Standing on left leg, bring right leg to 90 degrees in front of you.  Next, extend right leg behind you until you reach a lunge position keeping weight on left leg.  Keep left leg in straight forward alignment.  Repeat sets on both legs.   STEP UP  While standing with both feet on the floor, step up a step with one leg.Return backward towards the floor leading with the same leg.

## 2014-10-17 NOTE — Therapy (Signed)
[place] [facility]ICINE [address], [place], [address] Phone: [phone]   Fax:  [phone]  Physical Therapy Treatment  Patient Details  Name: Lori Carr MRN: [id number] Date of Birth: Feb 05, 1965 Referring Provider:  Deeann Saint, MD  Encounter Date: 10/17/2014      PT End of Session - 10/17/14 1104    Visit Number 5   Number of Visits 9   Date for PT Re-Evaluation 10/27/14   PT Start Time 1009   PT Stop Time 1048   PT Time Calculation (min) 39 min   Activity Tolerance Patient tolerated treatment well;No increased pain   Behavior During Therapy [facility] for tasks assessed/performed      Past Medical History  Diagnosis Date  . Diabetes mellitus   . Obesity     Past Surgical History  Procedure Laterality Date  . Cesarean section      x3    There were no vitals filed for this visit.  Visit Diagnosis:  Ankle weakness  Chronic ankle pain, left  Right foot pain      Subjective Assessment - 10/17/14 1011    Subjective Patient reports she had some left ankle pain, last evening after her exercises she performed the band exercises.    Limitations Walking   Patient Stated Goals To decrease pain and increase ankle strength   Currently in Pain? No/denies        TherEx  Standing heel raises x 20 repetitions   Lateral walks x 3 laps of 5 steps with no increase in pain, increased to yellow t-band x 4 laps of 5-7 steps bilaterally for 2 sets with no increase in pain.  Sit to stands x 12 in her preferred stance noted mild pain in her knees and inside of ankles, cued patient to have wider stance with slightly more ER at the feet to increase activation of hip musculature, no increase in pain with this method and felt better on her knees/ left ankle for additional set of 12.    Single leg stance to encourage dynamic stability, able to perform with ease. Increased/progressed to [facility] single leg deadlifts/golfers  bends. X 10 bilaterally no increase in pain. X 8 bilaterally on blue foam pad.   Patient has noted some pain previously with going up steps, educated patient to maintain more vertical trunk to decrease required knee flexion and encourage more hip extension throughout movement. Patient also educated to side step down which did not increase her pain. Performed 3 x 4 steps.                          PT Education - 10/17/14 1102    Education provided Yes   Education Details Progressed HEP, educated patient regarding use of hip rather than ankle for lateral movement strategies.    Person(s) Educated Patient   Methods Explanation;Demonstration;Handout   Comprehension Verbalized understanding;Returned demonstration             PT Long Term Goals - 10/03/14 1038    PT LONG TERM GOAL #1   Title Patient will report decreased pain per pain scale of < 2/10.    Baseline 6-7/10   Time 4   Period Weeks   Status Achieved   PT LONG TERM GOAL #2   Title Patient will demonstrate improved mobility with improved LEFS score of > 50/80.   Baseline 34/80   Time 4   Period Weeks  PT LONG TERM GOAL #3   Title Patient will be independent with HEP for improved B ankle strength and stability.    Time 4   Period Weeks   Status Achieved   PT LONG TERM GOAL #4   Title Patient will demonstrate improved ankle strength without pain during dynamic activities.    Time 2   Period Weeks   Status New               Plan - 10/17/14 1105    Clinical Impression Statement Patient demonstrates decreased closed chain single leg support with dynamic movements, she demonstrates appropriate stability with static movements at this time. She does not complain of any ankle pain throughout this session, even with more challenging activities. In fact her R knee seems to bother her more than her ankle at this time. Patient would benefit from additional direction for LE strengthening, particularly  single leg to increase dynamic stability.    Pt will benefit from skilled therapeutic intervention in order to improve on the following deficits Pain;Difficulty walking;Decreased strength;Decreased range of motion;Increased edema   Rehab Potential Good   PT Frequency 2x / week   PT Duration 4 weeks   PT Treatment/Interventions Cryotherapy;Ultrasound;Therapeutic activities;Therapeutic exercise;Balance training;Manual techniques   PT Next Visit Plan Progress single leg closed chain and general LE strengthening as tolerated.    PT Home Exercise Plan See patient instructions.    Consulted and Agree with Plan of Care Patient        Problem List Patient Active Problem List   Diagnosis Date Noted  . Dupuytren's contracture of foot 06/05/2013  . Diabetes mellitus 09/06/2010   Kerin Ransom, PT, DPT    10/17/2014, 11:12 AM  [place] [facility] PHYSICAL AND SPORTS MEDICINE [address], [place], [address] Phone: [phone]   Fax:  [phone]

## 2014-10-21 ENCOUNTER — Ambulatory Visit: Payer: Worker's Compensation | Admitting: Physical Therapy

## 2014-10-21 DIAGNOSIS — R29898 Other symptoms and signs involving the musculoskeletal system: Secondary | ICD-10-CM

## 2014-10-21 DIAGNOSIS — M259 Joint disorder, unspecified: Secondary | ICD-10-CM | POA: Diagnosis not present

## 2014-10-21 DIAGNOSIS — G8929 Other chronic pain: Secondary | ICD-10-CM

## 2014-10-21 DIAGNOSIS — M25572 Pain in left ankle and joints of left foot: Secondary | ICD-10-CM

## 2014-10-21 DIAGNOSIS — M79671 Pain in right foot: Secondary | ICD-10-CM

## 2014-10-21 NOTE — Patient Instructions (Addendum)
   All exercises provided were adapted from [email or web address].com. Patient was provided a written handout with pictures as described. Any additional cues were manually entered in to handout and copied in to this document.  6 weeks after surgery.  Squat Deck: (10 times 3 sets per day, 2-3x per week) This allows you to vary the amount of weight that you press with your legs. This is a good exercise machine for keeping your quads strong after surgery. You want to make sure not to put your knee through full range of motion. Instead, you should perform "short-arc" exercises where you only bend your knees to the degree shown in these pictures.    LOOPED ELASTIC BAND HIP ABDUCTION (15 times 3 sets per day)  While standing with an elastic band looped around your ankles, move the target leg out to the side as shown.   SQUATS (12 times 3 sets per day)  While standing with feet shoulder width apart and in front of a stable support for balance assist if needed, bend your knees and lower your body towards the floor. Your body weight should mostly be directed through the heels of your feet. Return to a standing position.   Knees should bend in line with the 2nd toe and not pass the front of the foot.   ELASTIC BAND LATERAL WALKS  (15 times 3 sets per day)  With an elastic band around both ankles, walk to the side while keeping your feet spread apart. Keep your knees bent the entire time.

## 2014-10-21 NOTE — Therapy (Signed)
[place] [facility] PHYSICAL [facility]ORTS MEDICINE [address], [place], [address] Phone: [phone]   Fax:  [phone]  Physical Therapy Treatment/DISCHARGE SUMMARY  Patient Details  Name: Lori Carr MRN: [id number] Date of Birth: [birth year] Referring Provider:  Deeann Saint, MD  Encounter Date: 10/21/2014      PT End of Session - 10/21/14 1059    Visit Number 6   Number of Visits 9   Date for PT Re-Evaluation 10/27/14   PT Start Time 1014   PT Stop Time 1054   PT Time Calculation (min) 40 min   Activity Tolerance Patient tolerated treatment well;No increased pain   Behavior During Therapy [facility] for tasks assessed/performed      Past Medical History  Diagnosis Date  . Diabetes mellitus   . Obesity     Past Surgical History  Procedure Laterality Date  . Cesarean section      x3    There were no vitals filed for this visit.  Visit Diagnosis:  Ankle weakness  Chronic ankle pain, left  Right foot pain      Subjective Assessment - 10/21/14 1015    Subjective Patient reports no difficulty with her home exercise program and no reports of pain recently. She states she has not had as much discomfort with ascending/descending stairs.    Limitations Walking   Patient Stated Goals To decrease pain and increase ankle strength   Currently in Pain? No/denies        Heel Raises 2 sets x 15 repetitions  Green band lateral walks  2 sets x 4 repetitions of 12 repetitions per side, no increased pain   Green band monster walks  1 set forward 1 set retro x 12 repetitions bilaterally no increased pain, some "stretching" in her hip.   Squat patterning and lifting objects off the floor, patient able to lift 8, then 10# DB off the floor with no increase in pain with multiple repetitions.    Lateral step ups x 10 bilaterally with no increased pain and no HHA   Ascending/descending steps, multiple repetitions of 4 steps with appropriate  vertical trunk positioning. No increase in symptoms, representing a significant improvement over time of evaluation    OMEGA Leg Press 35# easy for 15 repetitions, progressed to 45# for 15 repetitions with no difficulty   Standing single leg hip abductions with green t-band x 15 bilaterally, no increased pain                           PT Education - 10/21/14 1103    Education provided Yes   Education Details Progressed with final HEP, exercise instruction, and her significant progress with PT/readiness for discharge.    Person(s) Educated Patient   Methods Explanation;Demonstration;Handout   Comprehension Returned demonstration;Verbalized understanding             PT Long Term Goals - 10/21/14 1059    PT LONG TERM GOAL #1   Title Patient will report decreased pain per pain scale of < 2/10.    Baseline 0 today and last several visits    Time 4   Period Weeks   Status Achieved   PT LONG TERM GOAL #2   Title Patient will demonstrate improved mobility with improved LEFS score of > 50/80.   Baseline 34/80 at baseline, 59/80 today (running and walking distance biggest deficits)    Time 4   Period Weeks  Status Achieved   PT LONG TERM GOAL #3   Title Patient will be independent with HEP for improved B ankle strength and stability.    Time 4   Period Weeks   Status Achieved   PT LONG TERM GOAL #4   Title Patient will demonstrate improved ankle strength without pain during dynamic activities.    Baseline No pain during any activity during this or last sessions    Time 2   Period Weeks   Status Achieved               Plan - 10/21/14 1101    Clinical Impression Statement Patient demonstrates increased strength and stability in her LEs with no reports of pain during this session or previous session. Patient has been compliant with HEP with no increase in pain or difficulty with dynamic challenging exercises. Patient at this point has very minimal  complaints of R knee popping, otherwise no reports of symptoms. She has met all PT related goals and agrees she is ready for discharge.    Pt will benefit from skilled therapeutic intervention in order to improve on the following deficits Pain;Difficulty walking;Decreased strength;Decreased range of motion;Increased edema   Rehab Potential Good   PT Frequency 2x / week   PT Duration 4 weeks   PT Treatment/Interventions Cryotherapy;Ultrasound;Therapeutic activities;Therapeutic exercise;Balance training;Manual techniques   PT Next Visit Plan Patient discharged with all goals met.    PT Home Exercise Plan See patient instructions.    Consulted and Agree with Plan of Care Patient        Problem List Patient Active Problem List   Diagnosis Date Noted  . Dupuytren's contracture of foot 06/05/2013  . Diabetes mellitus 09/06/2010    Kerman Passey, PT, DPT    10/21/2014, 4:44 PM  [place] [facility] PHYSICAL AND SPORTS MEDICINE [address], [place], [address] Phone: [phone]   Fax:  [phone]

## 2014-10-24 ENCOUNTER — Encounter: Payer: Worker's Compensation | Admitting: Physical Therapy

## 2014-10-27 ENCOUNTER — Encounter: Payer: Worker's Compensation | Admitting: Physical Therapy

## 2014-10-30 ENCOUNTER — Encounter: Payer: Worker's Compensation | Admitting: Physical Therapy

## 2014-12-05 ENCOUNTER — Telehealth: Payer: Self-pay | Admitting: *Deleted

## 2014-12-05 DIAGNOSIS — Z3041 Encounter for surveillance of contraceptive pills: Secondary | ICD-10-CM

## 2014-12-05 MED ORDER — NORGESTIMATE-ETH ESTRADIOL 0.25-35 MG-MCG PO TABS: 1.0000 | ORAL_TABLET | Freq: Every day | ORAL | Status: DC

## 2014-12-05 NOTE — Telephone Encounter (Signed)
[facility] in [facility].  Pt aware.

## 2014-12-05 NOTE — Telephone Encounter (Signed)
-----   Message from Olevia BowensJacinda S Battle sent at 12/04/2014  3:04 PM EDT ----- Regarding: birth control Needs Birth Control refilled, appt is scheduled for next week [facility]ses [facility] [phone]

## 2014-12-10 ENCOUNTER — Encounter: Payer: Self-pay | Admitting: Certified Nurse Midwife

## 2014-12-10 ENCOUNTER — Ambulatory Visit (INDEPENDENT_AMBULATORY_CARE_PROVIDER_SITE_OTHER): Payer: BLUE CROSS/BLUE SHIELD | Admitting: Certified Nurse Midwife

## 2014-12-10 VITALS — BP 118/74 | HR 93 | Resp 18 | Ht 62.0 in | Wt 192.0 lb

## 2014-12-10 DIAGNOSIS — Z01419 Encounter for gynecological examination (general) (routine) without abnormal findings: Secondary | ICD-10-CM | POA: Diagnosis not present

## 2014-12-10 DIAGNOSIS — Z1151 Encounter for screening for human papillomavirus (HPV): Secondary | ICD-10-CM | POA: Diagnosis not present

## 2014-12-10 DIAGNOSIS — Z3041 Encounter for surveillance of contraceptive pills: Secondary | ICD-10-CM

## 2014-12-10 DIAGNOSIS — Z124 Encounter for screening for malignant neoplasm of cervix: Secondary | ICD-10-CM

## 2014-12-10 MED ORDER — NORGESTIMATE-ETH ESTRADIOL 0.25-35 MG-MCG PO TABS
1.0000 | ORAL_TABLET | Freq: Every day | ORAL | Status: DC
Start: 2014-12-10 — End: 2015-11-06

## 2014-12-10 NOTE — Patient Instructions (Signed)

## 2014-12-10 NOTE — Progress Notes (Signed)
Patient ID: DENEENE TARVER, female   DOB: [birth year], 49 y.o.   MRN: [id number]    GYNECOLOGY CLINIC ANNUAL PREVENTATIVE CARE ENCOUNTER NOTE  Subjective:   Lori Carr is a 49 y.o. [phone] female here for a routine annual gynecologic exam.  Current complaints: none.   Denies abnormal vaginal bleeding, discharge, pelvic pain, problems with intercourse or other gynecologic concerns.    Gynecologic History Patient's last menstrual period was 11/26/2014 (approximate). Contraception: OCP (estrogen/progesterone) Last Pap: normal. Results were: normal Last mammogram: february. Results were: normal  Obstetric History OB History  Gravida Para Term Preterm AB SAB TAB Ectopic Multiple Living  0 0 3    # Outcome Date GA Lbr Len/2nd Weight Sex Delivery Anes PTL Lv  5 Preterm 2007 [redacted]w[redacted]d  8 lb (3.629 kg)  CS-LTranv Spinal  Y  4 Preterm 2003 [redacted]w[redacted]d  6 lb (2.722 kg)  CS-LTranv Spinal  Y     Comments: prom  3 SAB 2000             Comments: patient was 2-3 months along  2 Term 1990 [redacted]w[redacted]d  8 lb 8 oz (3.856 kg)  CS-LTranv Spinal  Y  1 TAB 1986              Past Medical History  Diagnosis Date  . Diabetes mellitus   . Obesity     Past Surgical History  Procedure Laterality Date  . Cesarean section      x3    Current Outpatient Prescriptions on File Prior to Visit  Medication Sig Dispense Refill  . BAYER CONTOUR TEST test strip     . Insulin Glargine (LANTUS [place]ellen) Inject into the skin.      . metFORMIN (GLUCOPHAGE) 500 MG tablet Take 500 mg by mouth 2 (two) times daily with a meal.      . norgestimate-ethinyl estradiol (ORTHO-CYCLEN,SPRINTEC,PREVIFEM) 0.25-35 MG-MCG tablet Take 1 tablet by mouth daily. 1 Package 0  . NOVOLOG 100 UNIT/ML injection      No current facility-administered medications on file prior to visit.    Allergies  Allergen Reactions  . Amoxicillin-Pot Clavulanate Itching  . Vicodin [Hydrocodone-Acetaminophen] Nausea Only    Social History   Social  History  . Marital Status: Married    Spouse Name: N/A  . Number of Children: N/A  . Years of Education: N/A   Occupational History  . Supervisor, [employer]   Social History Main Topics  . Smoking status: Never Smoker   . Smokeless tobacco: Never Used  . Alcohol Use: No  . Drug Use: No  . Sexual Activity:    Partners: Male    Birth Control/ Protection: Pill   Other Topics Concern  . Not on file   Social History Narrative    Family History  Problem Relation Age of Onset  . Breast cancer Paternal Grandmother 94  . Cancer Mother 19    deceased age 62 stage IV ovarian cancer  . Breast cancer Maternal Aunt 50  . Breast cancer Paternal Aunt 63    The following portions of the patient's history were reviewed and updated as appropriate: allergies, current medications, past family history, past medical history, past social history, past surgical history and problem list.  Review of Systems Pertinent items are noted in HPI.   Objective:  BP 118/74 mmHg  Pulse 93  Resp 18  Ht  (1.575 m)  Wt 192 lb (87.091  kg)  BMI 35.11 kg/m2  LMP 11/26/2014 (Approximate) CONSTITUTIONAL: Well-developed, well-nourished female in no acute distress.  HENT:  Normocephalic, atraumatic, External right and left ear normal. Oropharynx is clear and moist EYES: Conjunctivae and EOM are normal. Pupils are equal, round, and reactive to light. No scleral icterus.  NECK: Normal range of motion, supple, no masses.  Normal thyroid.  SKIN: Skin is warm and dry. No rash noted. Not diaphoretic. No erythema. No pallor. NEUROLGIC: Alert and oriented to person, place, and time. Normal reflexes, muscle tone coordination. No cranial nerve deficit noted. PSYCHIATRIC: Normal mood and affect. Normal behavior. Normal judgment and thought content. CARDIOVASCULAR: Normal heart rate noted, regular rhythm RESPIRATORY: Clear to auscultation bilaterally. Effort and breath sounds normal, no  problems with respiration noted. BREASTS: Symmetric in size. No masses, skin changes, nipple drainage, or lymphadenopathy. ABDOMEN: Soft, normal bowel sounds, no distention noted.  No tenderness, rebound or guarding.  PELVIC: Normal appearing external genitalia; normal appearing vaginal mucosa and cervix.  No abnormal discharge noted.  Pap smear obtained.  Normal uterine size, no other palpable masses, no uterine or adnexal tenderness. MUSCULOSKELETAL: Normal range of motion. No tenderness.  No cyanosis, clubbing, or edema.  2+ distal pulses.   Assessment:  Annual gynecologic examination with pap smear   Plan:  Will follow up results of pap smear and manage accordingly. Mammogram scheduled Routine preventative health maintenance measures emphasized. Please refer to After Visit Summary for other counseling recommendations.  Sprintec #12  Illene BolusLori Clemmons CNM [facility]

## 2014-12-11 LAB — CYTOLOGY - PAP

## 2014-12-18 ENCOUNTER — Telehealth: Payer: Self-pay | Admitting: *Deleted

## 2014-12-18 DIAGNOSIS — B379 Candidiasis, unspecified: Secondary | ICD-10-CM

## 2014-12-18 MED ORDER — FLUCONAZOLE 150 MG PO TABS: 150.0000 mg | ORAL_TABLET | Freq: Once | ORAL | Status: DC

## 2014-12-18 NOTE — Telephone Encounter (Signed)
Positive yeast on pap.  Patient called and is having symptoms would like something sent into her pharmacy. I have sent in Diflucan .

## 2015-03-24 ENCOUNTER — Encounter: Payer: Self-pay | Admitting: Dietician

## 2015-03-24 ENCOUNTER — Encounter: Payer: BLUE CROSS/BLUE SHIELD | Attending: Family Medicine | Admitting: Dietician

## 2015-03-24 VITALS — Ht 62.0 in | Wt 195.5 lb

## 2015-03-24 DIAGNOSIS — E109 Type 1 diabetes mellitus without complications: Secondary | ICD-10-CM | POA: Diagnosis not present

## 2015-03-24 NOTE — Progress Notes (Signed)
Medical Nutrition Therapy: Visit start time: 1430  end time: 1530 Assessment:  Diagnosis: Type 1 diabetes Past medical history: Patient was diagnosed with diabetes at age 50. Psychosocial issues/ stress concerns: Patient describes her stress as high and indicates "ok" as to how she is dealing with her stress. Preferred learning method:  . Hands-on  Current weight: 195.5 lbs  Height: 62 in Medications, supplements: see list Progress and evaluation:  Patient reports that her last HgA1c had increased to 10. She checks her fasting glucose only and reports her readings are typically in the 200's. She reports a weight gain of at least 15-20 lbs in the past year which she attributes partly to a high level of stress and subsequent less healthy eating. She states her husband works 1st and 2nd shift and she has 49 year old and 49 year old who are involved in various activities. Most lunch meals and dinner meals are eaten "out"; often fast foods. She also often picks up fast food for breakfast on her way to work. She includes sweets on a daily basis, reporting that sweet snacks, desserts are available at work daily. Her beverages are sugar free.  Physical activity: none  Dietary Intake:  Usual eating pattern includes 3 meals and 2-3 snacks per day. Dining out frequency: 10-12 meals per week.  Breakfast: 8:30am- Ex. Sausage/egg/cheese biscuit; sometimes hash browns Lunch: 1:00pm- [employer]- beef barbeque, fried potatoes, corn,water or grilled chicken salad from [employer] or [employer] potato and cheeseburger Snack: candy corn, other sweets, package of snack crackers Supper: 6-7:30pm- same as lunch; sometimes has sandwich or soup at home; occasionally cooks meats and vegetables. Snack: chips Beverages: water with sugar-free pkt added;   Nutrition Care Education: Diabetes:  Instructed on a meal plan for diabetes based on 1400 calories to promote weight loss including carbohydrate counting, portion control, balance  of carbohydrate, protein and non-starchy vegetables. Discussed healthier choices when eating "out" and discussed simple meals that could be prepared at home to help decrease calories and fat content. Encouraged to be mindful that if making healthier choices, she may have to adjust her pre-meal insulin. Discussed prevention and treatment of low blood sugars. Discussed importance of blood sugar control to prevent future complications.  Nutritional Diagnosis:  NI-1.5 Excessive energy intake As related to intake of frequent dining out, high fat food choices.  As evidenced by diet history and reported weight gain in past year..  Intervention:  Prepare some breakfast meals at home such as an egg sandwich.  Try Boost glucose control or glucerna rather than skipping breakfast. If picking up breakfast, consider an English muffin with egg or some type meat rather than biscuits. Prepare some lunches at home to take to work: Ex. [place] sandwich, fruit. Prepare more evening meals at home.Consider picking up a sandwich or chicken and adding vegetables and fruit at home Spread 9-10 servings of carbohydrate over 3 meals and 1-2 snacks. Read labels for carbohydrate grams remembering that 15 gms of carbohydrate equals one serving.  Education Materials given:  . General diet guidelines for Diabetes . Food lists/ Planning A Balanced Meal . Sample meal pattern/ menus . Goals/ instructions Learner/ who was taught:  . Patient   Level of understanding: . Partial understanding; needs review/ practice  Learning barriers: . None Willingness to learn/ readiness for change: . Acceptance, ready for change  Monitoring and Evaluation:  Dietary intake, exercise,  and body weight      follow up: 04/20/15 at 1:45pm

## 2015-03-24 NOTE — Patient Instructions (Signed)
Prepare some breakfast meals at home such as an egg sandwich.  Try Boost glucose control or glucerna rather than skipping breakfast. If picking up breakfast, consider an English muffin with egg or some type meat rather than biscuits. Prepare some lunches at home to take to work: Ex. [place] sandwich, fruit. Prepare more evening meals at home.Consider picking up a sandwich or chicken and adding vegetables and fruit at home Spread 9-10 servings of carbohydrate over 3 meals and 1-2 snacks. Read labels for carbohydrate grams remembering that 15 gms of carbohydrate equals one serving.

## 2015-03-30 ENCOUNTER — Other Ambulatory Visit: Payer: Self-pay | Admitting: Family Medicine

## 2015-03-30 DIAGNOSIS — Z1231 Encounter for screening mammogram for malignant neoplasm of breast: Secondary | ICD-10-CM

## 2015-04-20 ENCOUNTER — Ambulatory Visit: Payer: Self-pay | Admitting: Dietician

## 2015-04-28 ENCOUNTER — Ambulatory Visit
Admission: RE | Admit: 2015-04-28 | Discharge: 2015-04-28 | Disposition: A | Discharge status: Home / Self Care | Payer: BLUE CROSS/BLUE SHIELD | Source: Ambulatory Visit | Attending: Family Medicine | Admitting: Family Medicine

## 2015-04-28 DIAGNOSIS — R928 Other abnormal and inconclusive findings on diagnostic imaging of breast: Secondary | ICD-10-CM | POA: Diagnosis not present

## 2015-04-28 DIAGNOSIS — Z1231 Encounter for screening mammogram for malignant neoplasm of breast: Secondary | ICD-10-CM | POA: Insufficient documentation

## 2015-04-30 ENCOUNTER — Other Ambulatory Visit: Payer: Self-pay | Admitting: Family Medicine

## 2015-04-30 DIAGNOSIS — R928 Other abnormal and inconclusive findings on diagnostic imaging of breast: Secondary | ICD-10-CM

## 2015-05-08 ENCOUNTER — Ambulatory Visit
Admission: RE | Admit: 2015-05-08 | Discharge: 2015-05-08 | Disposition: A | Discharge status: Home / Self Care | Payer: BLUE CROSS/BLUE SHIELD | Source: Ambulatory Visit | Attending: Family Medicine | Admitting: Family Medicine

## 2015-05-08 DIAGNOSIS — R928 Other abnormal and inconclusive findings on diagnostic imaging of breast: Secondary | ICD-10-CM

## 2015-05-08 DIAGNOSIS — N63 Unspecified lump in breast: Secondary | ICD-10-CM | POA: Diagnosis not present

## 2015-05-18 ENCOUNTER — Ambulatory Visit: Payer: Self-pay | Admitting: Dietician

## 2015-05-22 ENCOUNTER — Encounter: Payer: Self-pay | Admitting: Dietician

## 2015-11-06 ENCOUNTER — Telehealth: Payer: Self-pay | Admitting: *Deleted

## 2015-11-06 DIAGNOSIS — Z3041 Encounter for surveillance of contraceptive pills: Secondary | ICD-10-CM

## 2015-11-06 MED ORDER — NORGESTIMATE-ETH ESTRADIOL 0.25-35 MG-MCG PO TABS: 1.0000 | ORAL_TABLET | Freq: Every day | ORAL | 4 refills | Status: DC

## 2015-11-06 NOTE — Telephone Encounter (Signed)
Received fax from [facility] requesting refill on Sprintec, sent rx to pharmacy.

## 2015-12-11 ENCOUNTER — Encounter: Payer: Self-pay | Admitting: Family Medicine

## 2015-12-11 ENCOUNTER — Ambulatory Visit (INDEPENDENT_AMBULATORY_CARE_PROVIDER_SITE_OTHER): Payer: BLUE CROSS/BLUE SHIELD | Admitting: Family Medicine

## 2015-12-11 VITALS — BP 124/84 | HR 73 | Ht 62.0 in | Wt 193.0 lb

## 2015-12-11 DIAGNOSIS — Z780 Asymptomatic menopausal state: Secondary | ICD-10-CM

## 2015-12-11 DIAGNOSIS — Z1151 Encounter for screening for human papillomavirus (HPV): Secondary | ICD-10-CM

## 2015-12-11 DIAGNOSIS — Z124 Encounter for screening for malignant neoplasm of cervix: Secondary | ICD-10-CM | POA: Diagnosis not present

## 2015-12-11 DIAGNOSIS — Z803 Family history of malignant neoplasm of breast: Secondary | ICD-10-CM | POA: Diagnosis not present

## 2015-12-11 DIAGNOSIS — Z8041 Family history of malignant neoplasm of ovary: Secondary | ICD-10-CM | POA: Diagnosis not present

## 2015-12-11 DIAGNOSIS — Z01419 Encounter for gynecological examination (general) (routine) without abnormal findings: Secondary | ICD-10-CM | POA: Diagnosis not present

## 2015-12-11 NOTE — Progress Notes (Signed)
Normal pap 12/2014 MM screening complete 04/2015 Flu shot 11/2015 Pt has no problems/concerns - routine exam today

## 2015-12-11 NOTE — Patient Instructions (Signed)
Preventive Care for Adults, Female A healthy lifestyle and preventive care can promote health and wellness. Preventive health guidelines for women include the following key practices.  A routine yearly physical is a good way to check with your health care provider about your health and preventive screening. It is a chance to share any concerns and updates on your health and to receive a thorough exam.  Visit your dentist for a routine exam and preventive care every 6 months. Brush your teeth twice a day and floss once a day. Good oral hygiene prevents tooth decay and gum disease.  The frequency of eye exams is based on your age, health, family medical history, use of contact lenses, and other factors. Follow your health care provider's recommendations for frequency of eye exams.  Eat a healthy diet. Foods like vegetables, fruits, whole grains, low-fat dairy products, and lean protein foods contain the nutrients you need without too many calories. Decrease your intake of foods high in solid fats, added sugars, and salt. Eat the right amount of calories for you.Get information about a proper diet from your health care provider, if necessary.  Regular physical exercise is one of the most important things you can do for your health. Most adults should get at least 150 minutes of moderate-intensity exercise (any activity that increases your heart rate and causes you to sweat) each week. In addition, most adults need muscle-strengthening exercises on 2 or more days a week.  Maintain a healthy weight. The body mass index (BMI) is a screening tool to identify possible weight problems. It provides an estimate of body fat based on height and weight. Your health care provider can find your BMI and can help you achieve or maintain a healthy weight.For adults 20 years and older:  A BMI below 18.5 is considered underweight.  A BMI of 18.5 to 24.9 is normal.  A BMI of 25 to 29.9 is considered overweight.  A  BMI of 30 and above is considered obese.  Maintain normal blood lipids and cholesterol levels by exercising and minimizing your intake of saturated fat. Eat a balanced diet with plenty of fruit and vegetables. Blood tests for lipids and cholesterol should begin at age 45 and be repeated every 5 years. If your lipid or cholesterol levels are high, you are over 50, or you are at high risk for heart disease, you may need your cholesterol levels checked more frequently.Ongoing high lipid and cholesterol levels should be treated with medicines if diet and exercise are not working.  If you smoke, find out from your health care provider how to quit. If you do not use tobacco, do not start.  Lung cancer screening is recommended for adults aged 45-80 years who are at high risk for developing lung cancer because of a history of smoking. A yearly low-dose CT scan of the lungs is recommended for people who have at least a 30-pack-year history of smoking and are a current smoker or have quit within the past 15 years. A pack year of smoking is smoking an average of 1 pack of cigarettes a day for 1 year (for example: 1 pack a day for 30 years or 2 packs a day for 15 years). Yearly screening should continue until the smoker has stopped smoking for at least 15 years. Yearly screening should be stopped for people who develop a health problem that would prevent them from having lung cancer treatment.  If you are pregnant, do not drink alcohol. If you are  breastfeeding, be very cautious about drinking alcohol. If you are not pregnant and choose to drink alcohol, do not have more than 1 drink per day. One drink is considered to be 12 ounces (355 mL) of beer, 5 ounces (148 mL) of wine, or 1.5 ounces (44 mL) of liquor.  Avoid use of street drugs. Do not share needles with anyone. Ask for help if you need support or instructions about stopping the use of drugs.  High blood pressure causes heart disease and increases the risk  of stroke. Your blood pressure should be checked at least every 1 to 2 years. Ongoing high blood pressure should be treated with medicines if weight loss and exercise do not work.  If you are 55-79 years old, ask your health care provider if you should take aspirin to prevent strokes.  Diabetes screening is done by taking a blood sample to check your blood glucose level after you have not eaten for a certain period of time (fasting). If you are not overweight and you do not have risk factors for diabetes, you should be screened once every 3 years starting at age 45. If you are overweight or obese and you are 40-70 years of age, you should be screened for diabetes every year as part of your cardiovascular risk assessment.  Breast cancer screening is essential preventive care for women. You should practice "breast self-awareness." This means understanding the normal appearance and feel of your breasts and may include breast self-examination. Any changes detected, no matter how small, should be reported to a health care provider. Women in their 20s and 30s should have a clinical breast exam (CBE) by a health care provider as part of a regular health exam every 1 to 3 years. After age 40, women should have a CBE every year. Starting at age 40, women should consider having a mammogram (breast X-ray test) every year. Women who have a family history of breast cancer should talk to their health care provider about genetic screening. Women at a high risk of breast cancer should talk to their health care providers about having an MRI and a mammogram every year.  Breast cancer gene (BRCA)-related cancer risk assessment is recommended for women who have family members with BRCA-related cancers. BRCA-related cancers include breast, ovarian, tubal, and peritoneal cancers. Having family members with these cancers may be associated with an increased risk for harmful changes (mutations) in the breast cancer genes BRCA1 and  BRCA2. Results of the assessment will determine the need for genetic counseling and BRCA1 and BRCA2 testing.  Your health care provider may recommend that you be screened regularly for cancer of the pelvic organs (ovaries, uterus, and vagina). This screening involves a pelvic examination, including checking for microscopic changes to the surface of your cervix (Pap test). You may be encouraged to have this screening done every 3 years, beginning at age 21.  For women ages 30-65, health care providers may recommend pelvic exams and Pap testing every 3 years, or they may recommend the Pap and pelvic exam, combined with testing for human papilloma virus (HPV), every 5 years. Some types of HPV increase your risk of cervical cancer. Testing for HPV may also be done on women of any age with unclear Pap test results.  Other health care providers may not recommend any screening for nonpregnant women who are considered low risk for pelvic cancer and who do not have symptoms. Ask your health care provider if a screening pelvic exam is right for   you.  If you have had past treatment for cervical cancer or a condition that could lead to cancer, you need Pap tests and screening for cancer for at least 20 years after your treatment. If Pap tests have been discontinued, your risk factors (such as having a new sexual partner) need to be reassessed to determine if screening should resume. Some women have medical problems that increase the chance of getting cervical cancer. In these cases, your health care provider may recommend more frequent screening and Pap tests.  Colorectal cancer can be detected and often prevented. Most routine colorectal cancer screening begins at the age of 50 years and continues through age 75 years. However, your health care provider may recommend screening at an earlier age if you have risk factors for colon cancer. On a yearly basis, your health care provider may provide home test kits to check  for hidden blood in the stool. Use of a small camera at the end of a tube, to directly examine the colon (sigmoidoscopy or colonoscopy), can detect the earliest forms of colorectal cancer. Talk to your health care provider about this at age 50, when routine screening begins. Direct exam of the colon should be repeated every 5-10 years through age 75 years, unless early forms of precancerous polyps or small growths are found.  People who are at an increased risk for hepatitis B should be screened for this virus. You are considered at high risk for hepatitis B if:  You were born in a country where hepatitis B occurs often. Talk with your health care provider about which countries are considered high risk.  Your parents were born in a high-risk country and you have not received a shot to protect against hepatitis B (hepatitis B vaccine).  You have HIV or AIDS.  You use needles to inject street drugs.  You live with, or have sex with, someone who has hepatitis B.  You get hemodialysis treatment.  You take certain medicines for conditions like cancer, organ transplantation, and autoimmune conditions.  Hepatitis C blood testing is recommended for all people born from 1945 through 1965 and any individual with known risks for hepatitis C.  Practice safe sex. Use condoms and avoid high-risk sexual practices to reduce the spread of sexually transmitted infections (STIs). STIs include gonorrhea, chlamydia, syphilis, trichomonas, herpes, HPV, and human immunodeficiency virus (HIV). Herpes, HIV, and HPV are viral illnesses that have no cure. They can result in disability, cancer, and death.  You should be screened for sexually transmitted illnesses (STIs) including gonorrhea and chlamydia if:  You are sexually active and are younger than 24 years.  You are older than 24 years and your health care provider tells you that you are at risk for this type of infection.  Your sexual activity has changed  since you were last screened and you are at an increased risk for chlamydia or gonorrhea. Ask your health care provider if you are at risk.  If you are at risk of being infected with HIV, it is recommended that you take a prescription medicine daily to prevent HIV infection. This is called preexposure prophylaxis (PrEP). You are considered at risk if:  You are sexually active and do not regularly use condoms or know the HIV status of your partner(s).  You take drugs by injection.  You are sexually active with a partner who has HIV.  Talk with your health care provider about whether you are at high risk of being infected with HIV. If   you choose to begin PrEP, you should first be tested for HIV. You should then be tested every 3 months for as long as you are taking PrEP.  Osteoporosis is a disease in which the bones lose minerals and strength with aging. This can result in serious bone fractures or breaks. The risk of osteoporosis can be identified using a bone density scan. Women ages 67 years and over and women at risk for fractures or osteoporosis should discuss screening with their health care providers. Ask your health care provider whether you should take a calcium supplement or vitamin D to reduce the rate of osteoporosis.  Menopause can be associated with physical symptoms and risks. Hormone replacement therapy is available to decrease symptoms and risks. You should talk to your health care provider about whether hormone replacement therapy is right for you.  Use sunscreen. Apply sunscreen liberally and repeatedly throughout the day. You should seek shade when your shadow is shorter than you. Protect yourself by wearing long sleeves, pants, a wide-brimmed hat, and sunglasses year round, whenever you are outdoors.  Once a month, do a whole body skin exam, using a mirror to look at the skin on your back. Tell your health care provider of new moles, moles that have irregular borders, moles that  are larger than a pencil eraser, or moles that have changed in shape or color.  Stay current with required vaccines (immunizations).  Influenza vaccine. All adults should be immunized every year.  Tetanus, diphtheria, and acellular pertussis (Td, Tdap) vaccine. Pregnant women should receive 1 dose of Tdap vaccine during each pregnancy. The dose should be obtained regardless of the length of time since the last dose. Immunization is preferred during the 27th-36th week of gestation. An adult who has not previously received Tdap or who does not know her vaccine status should receive 1 dose of Tdap. This initial dose should be followed by tetanus and diphtheria toxoids (Td) booster doses every 10 years. Adults with an unknown or incomplete history of completing a 3-dose immunization series with Td-containing vaccines should begin or complete a primary immunization series including a Tdap dose. Adults should receive a Td booster every 10 years.  Varicella vaccine. An adult without evidence of immunity to varicella should receive 2 doses or a second dose if she has previously received 1 dose. Pregnant females who do not have evidence of immunity should receive the first dose after pregnancy. This first dose should be obtained before leaving the health care facility. The second dose should be obtained 4-8 weeks after the first dose.  Human papillomavirus (HPV) vaccine. Females aged 13-26 years who have not received the vaccine previously should obtain the 3-dose series. The vaccine is not recommended for use in pregnant females. However, pregnancy testing is not needed before receiving a dose. If a female is found to be pregnant after receiving a dose, no treatment is needed. In that case, the remaining doses should be delayed until after the pregnancy. Immunization is recommended for any person with an immunocompromised condition through the age of 61 years if she did not get any or all doses earlier. During the  3-dose series, the second dose should be obtained 4-8 weeks after the first dose. The third dose should be obtained 24 weeks after the first dose and 16 weeks after the second dose.  Zoster vaccine. One dose is recommended for adults aged 30 years or older unless certain conditions are present.  Measles, mumps, and rubella (MMR) vaccine. Adults born  before 1957 generally are considered immune to measles and mumps. Adults born in 1957 or later should have 1 or more doses of MMR vaccine unless there is a contraindication to the vaccine or there is laboratory evidence of immunity to each of the three diseases. A routine second dose of MMR vaccine should be obtained at least 28 days after the first dose for students attending postsecondary schools, health care workers, or international travelers. People who received inactivated measles vaccine or an unknown type of measles vaccine during 1963-1967 should receive 2 doses of MMR vaccine. People who received inactivated mumps vaccine or an unknown type of mumps vaccine before 1979 and are at high risk for mumps infection should consider immunization with 2 doses of MMR vaccine. For females of childbearing age, rubella immunity should be determined. If there is no evidence of immunity, females who are not pregnant should be vaccinated. If there is no evidence of immunity, females who are pregnant should delay immunization until after pregnancy. Unvaccinated health care workers born before 1957 who lack laboratory evidence of measles, mumps, or rubella immunity or laboratory confirmation of disease should consider measles and mumps immunization with 2 doses of MMR vaccine or rubella immunization with 1 dose of MMR vaccine.  Pneumococcal 13-valent conjugate (PCV13) vaccine. When indicated, a person who is uncertain of his immunization history and has no record of immunization should receive the PCV13 vaccine. All adults 65 years of age and older should receive this  vaccine. An adult aged 19 years or older who has certain medical conditions and has not been previously immunized should receive 1 dose of PCV13 vaccine. This PCV13 should be followed with a dose of pneumococcal polysaccharide (PPSV23) vaccine. Adults who are at high risk for pneumococcal disease should obtain the PPSV23 vaccine at least 8 weeks after the dose of PCV13 vaccine. Adults older than 49 years of age who have normal immune system function should obtain the PPSV23 vaccine dose at least 1 year after the dose of PCV13 vaccine.  Pneumococcal polysaccharide (PPSV23) vaccine. When PCV13 is also indicated, PCV13 should be obtained first. All adults aged 65 years and older should be immunized. An adult younger than age 65 years who has certain medical conditions should be immunized. Any person who resides in a nursing home or long-term care facility should be immunized. An adult smoker should be immunized. People with an immunocompromised condition and certain other conditions should receive both PCV13 and PPSV23 vaccines. People with human immunodeficiency virus (HIV) infection should be immunized as soon as possible after diagnosis. Immunization during chemotherapy or radiation therapy should be avoided. Routine use of PPSV23 vaccine is not recommended for American Indians, [place] Natives, or people younger than 65 years unless there are medical conditions that require PPSV23 vaccine. When indicated, people who have unknown immunization and have no record of immunization should receive PPSV23 vaccine. One-time revaccination 5 years after the first dose of PPSV23 is recommended for people aged 19-64 years who have chronic kidney failure, nephrotic syndrome, asplenia, or immunocompromised conditions. People who received 1-2 doses of PPSV23 before age 65 years should receive another dose of PPSV23 vaccine at age 65 years or later if at least 5 years have passed since the previous dose. Doses of PPSV23 are not  needed for people immunized with PPSV23 at or after age 65 years.  Meningococcal vaccine. Adults with asplenia or persistent complement component deficiencies should receive 2 doses of quadrivalent meningococcal conjugate (MenACWY-D) vaccine. The doses should be obtained   at least 2 months apart. Microbiologists working with certain meningococcal bacteria, [place] recruits, people at risk during an outbreak, and people who travel to or live in countries with a high rate of meningitis should be immunized. A first-year college student up through age 34 years who is living in a residence hall should receive a dose if she did not receive a dose on or after her 16th birthday. Adults who have certain high-risk conditions should receive one or more doses of vaccine.  Hepatitis A vaccine. Adults who wish to be protected from this disease, have certain high-risk conditions, work with hepatitis A-infected animals, work in hepatitis A research labs, or travel to or work in countries with a high rate of hepatitis A should be immunized. Adults who were previously unvaccinated and who anticipate close contact with an international adoptee during the first 60 days after arrival in the [place] from a country with a high rate of hepatitis A should be immunized.  Hepatitis B vaccine. Adults who wish to be protected from this disease, have certain high-risk conditions, may be exposed to blood or other infectious body fluids, are household contacts or sex partners of hepatitis B positive people, are clients or workers in certain care facilities, or travel to or work in countries with a high rate of hepatitis B should be immunized.  Haemophilus influenzae type b (Hib) vaccine. A previously unvaccinated person with asplenia or sickle cell disease or having a scheduled splenectomy should receive 1 dose of Hib vaccine. Regardless of previous immunization, a recipient of a hematopoietic stem cell transplant should receive a  3-dose series 6-12 months after her successful transplant. Hib vaccine is not recommended for adults with HIV infection. Preventive Services / Frequency Ages 35 to 4 years  Blood pressure check.** / Every 3-5 years.  Lipid and cholesterol check.** / Every 5 years beginning at age 60.  Clinical breast exam.** / Every 3 years for women in their 71s and 10s.  BRCA-related cancer risk assessment.** / For women who have family members with a BRCA-related cancer (breast, ovarian, tubal, or peritoneal cancers).  Pap test.** / Every 2 years from ages 76 through 26. Every 3 years starting at age 61 through age 76 or 93 with a history of 3 consecutive normal Pap tests.  HPV screening.** / Every 3 years from ages 37 through ages 60 to 51 with a history of 3 consecutive normal Pap tests.  Hepatitis C blood test.** / For any individual with known risks for hepatitis C.  Skin self-exam. / Monthly.  Influenza vaccine. / Every year.  Tetanus, diphtheria, and acellular pertussis (Tdap, Td) vaccine.** / Consult your health care provider. Pregnant women should receive 1 dose of Tdap vaccine during each pregnancy. 1 dose of Td every 10 years.  Varicella vaccine.** / Consult your health care provider. Pregnant females who do not have evidence of immunity should receive the first dose after pregnancy.  HPV vaccine. / 3 doses over 6 months, if 93 and younger. The vaccine is not recommended for use in pregnant females. However, pregnancy testing is not needed before receiving a dose.  Measles, mumps, rubella (MMR) vaccine.** / You need at least 1 dose of MMR if you were born in 1957 or later. You may also need a 2nd dose. For females of childbearing age, rubella immunity should be determined. If there is no evidence of immunity, females who are not pregnant should be vaccinated. If there is no evidence of immunity, females who are  pregnant should delay immunization until after pregnancy.  Pneumococcal  13-valent conjugate (PCV13) vaccine.** / Consult your health care provider.  Pneumococcal polysaccharide (PPSV23) vaccine.** / 1 to 2 doses if you smoke cigarettes or if you have certain conditions.  Meningococcal vaccine.** / 1 dose if you are age 68 to 8 years and a Market researcher living in a residence hall, or have one of several medical conditions, you need to get vaccinated against meningococcal disease. You may also need additional booster doses.  Hepatitis A vaccine.** / Consult your health care provider.  Hepatitis B vaccine.** / Consult your health care provider.  Haemophilus influenzae type b (Hib) vaccine.** / Consult your health care provider. Ages 7 to 53 years  Blood pressure check.** / Every year.  Lipid and cholesterol check.** / Every 5 years beginning at age 25 years.  Lung cancer screening. / Every year if you are aged 11-80 years and have a 30-pack-year history of smoking and currently smoke or have quit within the past 15 years. Yearly screening is stopped once you have quit smoking for at least 15 years or develop a health problem that would prevent you from having lung cancer treatment.  Clinical breast exam.** / Every year after age 48 years.  BRCA-related cancer risk assessment.** / For women who have family members with a BRCA-related cancer (breast, ovarian, tubal, or peritoneal cancers).  Mammogram.** / Every year beginning at age 41 years and continuing for as long as you are in good health. Consult with your health care provider.  Pap test.** / Every 3 years starting at age 65 years through age 37 or 70 years with a history of 3 consecutive normal Pap tests.  HPV screening.** / Every 3 years from ages 72 years through ages 60 to 40 years with a history of 3 consecutive normal Pap tests.  Fecal occult blood test (FOBT) of stool. / Every year beginning at age 21 years and continuing until age 5 years. You may not need to do this test if you get  a colonoscopy every 10 years.  Flexible sigmoidoscopy or colonoscopy.** / Every 5 years for a flexible sigmoidoscopy or every 10 years for a colonoscopy beginning at age 35 years and continuing until age 48 years.  Hepatitis C blood test.** / For all people born from [birth year] through 1965 and any individual with known risks for hepatitis C.  Skin self-exam. / Monthly.  Influenza vaccine. / Every year.  Tetanus, diphtheria, and acellular pertussis (Tdap/Td) vaccine.** / Consult your health care provider. Pregnant women should receive 1 dose of Tdap vaccine during each pregnancy. 1 dose of Td every 10 years.  Varicella vaccine.** / Consult your health care provider. Pregnant females who do not have evidence of immunity should receive the first dose after pregnancy.  Zoster vaccine.** / 1 dose for adults aged 30 years or older.  Measles, mumps, rubella (MMR) vaccine.** / You need at least 1 dose of MMR if you were born in 1957 or later. You may also need a second dose. For females of childbearing age, rubella immunity should be determined. If there is no evidence of immunity, females who are not pregnant should be vaccinated. If there is no evidence of immunity, females who are pregnant should delay immunization until after pregnancy.  Pneumococcal 13-valent conjugate (PCV13) vaccine.** / Consult your health care provider.  Pneumococcal polysaccharide (PPSV23) vaccine.** / 1 to 2 doses if you smoke cigarettes or if you have certain conditions.  Meningococcal vaccine.** /  Consult your health care provider.  Hepatitis A vaccine.** / Consult your health care provider.  Hepatitis B vaccine.** / Consult your health care provider.  Haemophilus influenzae type b (Hib) vaccine.** / Consult your health care provider. Ages 64 years and over  Blood pressure check.** / Every year.  Lipid and cholesterol check.** / Every 5 years beginning at age 23 years.  Lung cancer screening. / Every year if you  are aged 16-80 years and have a 30-pack-year history of smoking and currently smoke or have quit within the past 15 years. Yearly screening is stopped once you have quit smoking for at least 15 years or develop a health problem that would prevent you from having lung cancer treatment.  Clinical breast exam.** / Every year after age 74 years.  BRCA-related cancer risk assessment.** / For women who have family members with a BRCA-related cancer (breast, ovarian, tubal, or peritoneal cancers).  Mammogram.** / Every year beginning at age 44 years and continuing for as long as you are in good health. Consult with your health care provider.  Pap test.** / Every 3 years starting at age 58 years through age 22 or 39 years with 3 consecutive normal Pap tests. Testing can be stopped between 65 and 70 years with 3 consecutive normal Pap tests and no abnormal Pap or HPV tests in the past 10 years.  HPV screening.** / Every 3 years from ages 64 years through ages 70 or 61 years with a history of 3 consecutive normal Pap tests. Testing can be stopped between 65 and 70 years with 3 consecutive normal Pap tests and no abnormal Pap or HPV tests in the past 10 years.  Fecal occult blood test (FOBT) of stool. / Every year beginning at age 40 years and continuing until age 27 years. You may not need to do this test if you get a colonoscopy every 10 years.  Flexible sigmoidoscopy or colonoscopy.** / Every 5 years for a flexible sigmoidoscopy or every 10 years for a colonoscopy beginning at age 7 years and continuing until age 32 years.  Hepatitis C blood test.** / For all people born from [birth year] through 1964 and any individual with known risks for hepatitis C.  Osteoporosis screening.** / A one-time screening for women ages 30 years and over and women at risk for fractures or osteoporosis.  Skin self-exam. / Monthly.  Influenza vaccine. / Every year.  Tetanus, diphtheria, and acellular pertussis (Tdap/Td)  vaccine.** / 1 dose of Td every 10 years.  Varicella vaccine.** / Consult your health care provider.  Zoster vaccine.** / 1 dose for adults aged 35 years or older.  Pneumococcal 13-valent conjugate (PCV13) vaccine.** / Consult your health care provider.  Pneumococcal polysaccharide (PPSV23) vaccine.** / 1 dose for all adults aged 46 years and older.  Meningococcal vaccine.** / Consult your health care provider.  Hepatitis A vaccine.** / Consult your health care provider.  Hepatitis B vaccine.** / Consult your health care provider.  Haemophilus influenzae type b (Hib) vaccine.** / Consult your health care provider. ** Family history and personal history of risk and conditions may change your health care provider's recommendations.   This information is not intended to replace advice given to you by your health care provider. Make sure you discuss any questions you have with your health care provider.   Document Released: 03/15/2001 Document Revised: 02/07/2014 Document Reviewed: 06/14/2010 Elsevier Interactive Patient Education [employer].

## 2015-12-11 NOTE — Progress Notes (Signed)
Your hormones  Subjective:     Lori Carr Lori Carr Carr is a 50 y.o. female and is here for a comprehensive physical exam. The patient reports no problems. She continues on OC's. No hot flashes as yet. Normal mammogram 05/2015. Labs by PCP.  Social History   Social History  . Marital status: Married    Spouse name: N/A  . Number of children: N/A  . Years of education: N/A   Occupational History  . Supervisor, [employer]    Social History Main Topics  . Smoking status: Never Smoker  . Smokeless tobacco: Never Used  . Alcohol use No  . Drug use: No  . Sexual activity: Yes    Partners: Male    Birth control/ protection: Pill   Other Topics Concern  . Not on file   Social History Narrative  . No narrative on file   Health Maintenance  Topic Date Due  . HEMOGLOBIN A1C  January 19, 1967  . PNEUMOCOCCAL POLYSACCHARIDE VACCINE (1) 06/08/1968  . FOOT EXAM  06/08/1976  . OPHTHALMOLOGY EXAM  06/08/1976  . URINE MICROALBUMIN  06/08/1976  . HIV Screening  06/08/1981  . TETANUS/TDAP  06/08/1985  . INFLUENZA VACCINE  09/01/2015  . PAP SMEAR  12/09/2017    The following portions of the patient's history were reviewed and updated as appropriate: allergies, current medications, past family history, past medical history, past social history, past surgical history and problem list.  Review of Systems Pertinent items noted in HPI and remainder of comprehensive ROS otherwise negative.   Objective:    BP 124/84   Pulse 73   Ht 5\' 2"  (1.575 m)   Wt 193 lb (87.5 kg)   LMP 11/27/2015   BMI 35.30 kg/m  General appearance: alert, cooperative and appears stated age Head: Normocephalic, without obvious abnormality, atraumatic Neck: no adenopathy, supple, symmetrical, trachea midline and thyroid not enlarged, symmetric, no tenderness/mass/nodules Lungs: clear to auscultation bilaterally Breasts: normal appearance, no masses or tenderness Heart: regular rate and rhythm, S1, S2  normal, no murmur, click, rub or gallop Abdomen: soft, non-tender; bowel sounds normal; no masses,  no organomegaly Pelvic: cervix normal in appearance, external genitalia normal, no adnexal masses or tenderness, no cervical motion tenderness, uterus normal size, shape, and consistency and vagina normal without discharge Extremities: extremities normal, atraumatic, no cyanosis or edema Pulses: 2+ and symmetric Skin: Skin color, texture, turgor normal. No rashes or lesions Lymph nodes: Cervical, supraclavicular, and axillary nodes normal. Neurologic: Grossly normal    Assessment:    Healthy female exam.      Plan:      Problem List Items Addressed This Visit    None    Visit Diagnoses    Encounter for gynecological examination without abnormal finding    -  Primary   Relevant Orders   Cytology - PAP   Menopause       Relevant Orders   Follicle stimulating hormone (Completed)      See After Visit Summary for Counseling Recommendations

## 2015-12-12 LAB — FOLLICLE STIMULATING HORMONE: FSH: 5.8 m[IU]/mL

## 2015-12-15 LAB — CYTOLOGY - PAP
ADEQUACY: ABSENT
Diagnosis: NEGATIVE
HPV: NOT DETECTED

## 2015-12-16 ENCOUNTER — Encounter: Payer: Self-pay | Admitting: *Deleted

## 2016-01-13 ENCOUNTER — Encounter: Payer: Self-pay | Admitting: *Deleted

## 2016-02-01 HISTORY — PX: COLONOSCOPY: SHX174

## 2016-04-06 ENCOUNTER — Other Ambulatory Visit: Payer: Self-pay | Admitting: Family Medicine

## 2016-04-06 DIAGNOSIS — Z1231 Encounter for screening mammogram for malignant neoplasm of breast: Secondary | ICD-10-CM

## 2016-05-04 ENCOUNTER — Ambulatory Visit
Admission: RE | Admit: 2016-05-04 | Discharge: 2016-05-04 | Disposition: A | Discharge status: Home / Self Care | Payer: BLUE CROSS/BLUE SHIELD | Source: Ambulatory Visit | Attending: Family Medicine | Admitting: Family Medicine

## 2016-05-04 DIAGNOSIS — Z1231 Encounter for screening mammogram for malignant neoplasm of breast: Secondary | ICD-10-CM | POA: Diagnosis not present

## 2016-05-16 ENCOUNTER — Other Ambulatory Visit: Payer: Self-pay | Admitting: *Deleted

## 2016-05-16 DIAGNOSIS — Z3041 Encounter for surveillance of contraceptive pills: Secondary | ICD-10-CM

## 2016-05-16 MED ORDER — NORGESTIMATE-ETH ESTRADIOL 0.25-35 MG-MCG PO TABS: 1.0000 | ORAL_TABLET | Freq: Every day | ORAL | 4 refills | Status: DC

## 2016-05-16 MED ORDER — NORGESTIMATE-ETH ESTRADIOL 0.25-35 MG-MCG PO TABS: 1.0000 | ORAL_TABLET | Freq: Every day | ORAL | 11 refills | Status: DC

## 2016-05-16 NOTE — Progress Notes (Signed)
Refilled OCP since pt just had annual visit.

## 2016-06-22 DIAGNOSIS — M79602 Pain in left arm: Secondary | ICD-10-CM | POA: Diagnosis not present

## 2016-08-07 ENCOUNTER — Emergency Department
Admission: EM | Admit: 2016-08-07 | Discharge: 2016-08-07 | Disposition: A | Discharge status: Home / Self Care | Payer: BLUE CROSS/BLUE SHIELD | Attending: Emergency Medicine | Admitting: Emergency Medicine

## 2016-08-07 DIAGNOSIS — Z7984 Long term (current) use of oral hypoglycemic drugs: Secondary | ICD-10-CM | POA: Insufficient documentation

## 2016-08-07 DIAGNOSIS — E119 Type 2 diabetes mellitus without complications: Secondary | ICD-10-CM | POA: Insufficient documentation

## 2016-08-07 DIAGNOSIS — Y999 Unspecified external cause status: Secondary | ICD-10-CM | POA: Diagnosis not present

## 2016-08-07 DIAGNOSIS — Y929 Unspecified place or not applicable: Secondary | ICD-10-CM | POA: Diagnosis not present

## 2016-08-07 DIAGNOSIS — S161XXA Strain of muscle, fascia and tendon at neck level, initial encounter: Secondary | ICD-10-CM | POA: Diagnosis not present

## 2016-08-07 DIAGNOSIS — S199XXA Unspecified injury of neck, initial encounter: Secondary | ICD-10-CM | POA: Diagnosis present

## 2016-08-07 DIAGNOSIS — Z793 Long term (current) use of hormonal contraceptives: Secondary | ICD-10-CM | POA: Diagnosis not present

## 2016-08-07 DIAGNOSIS — Y939 Activity, unspecified: Secondary | ICD-10-CM | POA: Insufficient documentation

## 2016-08-07 MED ORDER — METHOCARBAMOL 500 MG PO TABS: 500.0000 mg | ORAL_TABLET | Freq: Four times a day (QID) | ORAL | 0 refills | Status: DC

## 2016-08-07 MED ORDER — MELOXICAM 15 MG PO TABS: 15.0000 mg | ORAL_TABLET | Freq: Every day | ORAL | 0 refills | Status: DC
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
## 2016-09-07 DIAGNOSIS — Z1211 Encounter for screening for malignant neoplasm of colon: Secondary | ICD-10-CM | POA: Diagnosis not present

## 2016-09-07 DIAGNOSIS — Z8371 Family history of colonic polyps: Secondary | ICD-10-CM | POA: Diagnosis not present

## 2016-09-26 ENCOUNTER — Other Ambulatory Visit: Payer: Self-pay | Admitting: Family Medicine

## 2016-09-26 DIAGNOSIS — Z3041 Encounter for surveillance of contraceptive pills: Secondary | ICD-10-CM

## 2016-11-01 DIAGNOSIS — K648 Other hemorrhoids: Secondary | ICD-10-CM | POA: Diagnosis not present

## 2016-11-01 DIAGNOSIS — Z8371 Family history of colonic polyps: Secondary | ICD-10-CM | POA: Diagnosis not present

## 2016-11-01 DIAGNOSIS — Z1211 Encounter for screening for malignant neoplasm of colon: Secondary | ICD-10-CM | POA: Diagnosis not present

## 2017-01-10 ENCOUNTER — Ambulatory Visit: Payer: Self-pay | Admitting: Family Medicine

## 2017-01-20 ENCOUNTER — Ambulatory Visit: Payer: Self-pay | Admitting: Obstetrics & Gynecology

## 2017-01-20 ENCOUNTER — Telehealth: Payer: Self-pay | Admitting: *Deleted

## 2017-01-20 DIAGNOSIS — Z3041 Encounter for surveillance of contraceptive pills: Secondary | ICD-10-CM

## 2017-01-20 MED ORDER — NORGESTIMATE-ETH ESTRADIOL 0.25-35 MG-MCG PO TABS: 1.0000 | ORAL_TABLET | Freq: Every day | ORAL | 2 refills | Status: DC

## 2017-01-20 NOTE — Telephone Encounter (Signed)
-----   Message from Lindell SparHeather L Bacon, [place] sent at 01/19/2017  2:53 PM EST ----- [facility]

## 2017-01-27 ENCOUNTER — Ambulatory Visit (INDEPENDENT_AMBULATORY_CARE_PROVIDER_SITE_OTHER): Payer: BLUE CROSS/BLUE SHIELD | Admitting: Obstetrics and Gynecology

## 2017-01-27 ENCOUNTER — Encounter: Payer: Self-pay | Admitting: Obstetrics and Gynecology

## 2017-01-27 VITALS — BP 112/67 | HR 72 | Wt 195.6 lb

## 2017-01-27 DIAGNOSIS — Z124 Encounter for screening for malignant neoplasm of cervix: Secondary | ICD-10-CM

## 2017-01-27 DIAGNOSIS — Z3041 Encounter for surveillance of contraceptive pills: Secondary | ICD-10-CM

## 2017-01-27 DIAGNOSIS — B3731 Acute candidiasis of vulva and vagina: Secondary | ICD-10-CM

## 2017-01-27 DIAGNOSIS — B373 Candidiasis of vulva and vagina: Secondary | ICD-10-CM

## 2017-01-27 DIAGNOSIS — Z1151 Encounter for screening for human papillomavirus (HPV): Secondary | ICD-10-CM

## 2017-01-27 DIAGNOSIS — Z01419 Encounter for gynecological examination (general) (routine) without abnormal findings: Secondary | ICD-10-CM

## 2017-01-27 MED ORDER — FLUCONAZOLE 150 MG PO TABS: ORAL_TABLET | ORAL | 1 refills | Status: DC

## 2017-01-27 NOTE — Progress Notes (Signed)
LAST PAP 12/2015 LAST MAMMO 2018

## 2017-01-27 NOTE — Progress Notes (Signed)
Obstetrics and Gynecology Annual Patient Evaluation  Appointment Date: 01/27/2017  OBGYN Clinic: [facility]  Primary Care Provider: Karen Kitchens  Chief Complaint:  Chief Complaint  Patient presents with  . Gynecologic Exam    History of Present Illness: Lori Carr is a 51 y.o. Caucasian [id number] (LMP: 2wks ago), seen for the above chief complaint. Her past medical history is significant for DM2, BMI 31s, h/o c-section x 3.   No breast s/s, fevers, chills, chest pain, SOB, nausea, vomiting, abdominal pain, dysuria, hematuria, vaginal itching, dyspareunia, SUI (rarely when bladder is full) or OAB, diarrhea, constipation, blood in BMs.  She did have some AUB that last for two weeks earlier this month when she got off schedule with her OCPs and she had some vaginal irritation around that time. She does feel that she is emptying her bladder fine. No climateric s/s  Review of Systems:  as noted in the History of Present Illness.   Past Medical History:  Past Medical History:  Diagnosis Date  . Diabetes mellitus   . Obesity     Past Surgical History:  Past Surgical History:  Procedure Laterality Date  . CESAREAN SECTION     x3    Past Obstetrical History:  OB History  Gravida Para Term Preterm AB Living  _0 SAB TAB Ectopic Multiple Live Births  1 1 0 0 3    # Outcome Date GA Lbr Len/2nd Weight Sex Delivery Anes PTL Lv  5 Preterm 2007 [redacted]w[redacted]d 8 lb (3.629 kg)  CS-LTranv Spinal  LIV  4 Preterm 2003 324w0d6 lb (2.722 kg)  CS-LTranv Spinal  LIV     Birth Comments: prom  3 SAB 2000             Birth Comments: patient was 2-3 months along  2 Term 1990 4057w0d lb 8 oz (3.856 kg)  CS-LTranv Spinal  LIV  1 TAB 1986              Past Gynecological History: As per HPI. Periods: qmonth regular. Pt on OCPs History of Pap Smear(s): Yes.   Last pap 2017, which was NILM/HPV neg She is currently using OCPs for contraception.    Social History:  Social History   Socioeconomic History  . Marital status: Married    Spouse name: Not on file  . Number of children: Not on file  . Years of education: Not on file  . Highest education level: Not on file  Social Needs  . Financial resource strain: Not on file  . Food insecurity - worry: Not on file  . Food insecurity - inability: Not on file  . Transportation needs - medical: Not on file  . Transportation needs - non-medical: Not on file  Occupational History  . 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 Smoking status: Never Smoker  . Smokeless tobacco: Never Used  Substance and Sexual Activity  . Alcohol use: No  . Drug use: No  . Sexual activity: Yes    Partners: Male    Birth control/protection: Pill  Other Topics Concern  . Not on file  Social History Narrative  . Not on file    Family History:  Family History  Problem Relation Age of Onset  . Cancer Mother 61 69    deceased age 34 77age IV ovarian cancer  . Breast  cancer Maternal Aunt 50  . Breast cancer Paternal Grandmother 79  . Breast cancer Paternal Aunt 42  . Stroke Brother    BRCA testing neg 2017  Health Maintenance:  Mammogram(s): Yes.   Date: 2018 Colonoscopy: Yes.   Date: 2018 per patient  Medications Jannifer Rodney had no medications administered during this visit. Current Outpatient Medications  Medication Sig Dispense Refill  . BAYER CONTOUR TEST test strip     . Dapagliflozin-Metformin HCl ER (XIGDUO XR) 10-998 MG TB24     . Insulin Glargine (LANTUS [place]) Inject into the skin.      Marland Kitchen NOVOLOG 100 UNIT/ML injection     . Sprintec     . meloxicam (MOBIC) 15 MG tablet Take 1 tablet (15 mg total) by mouth daily. (Patient not taking: Reported on 01/27/2017) 30 tablet 0  . methocarbamol (ROBAXIN) 500 MG tablet Take 1 tablet (500 mg total) by mouth 4 (four) times daily. (Patient not taking: Reported on 01/27/2017) 16 tablet 0    No current facility-administered medications for this visit.     Allergies Amoxicillin-pot clavulanate and Vicodin [hydrocodone-acetaminophen]   Physical Exam:  BP 112/67   Pulse 72   Wt 195 lb 9.6 oz (88.7 kg)   BMI 35.78 kg/m  Body mass index is 35.78 kg/m. Weight last year: 193 lbs General appearance: Well nourished, well developed female in no acute distress.  Cardiovascular: normal s1 and s2.  No murmurs, rubs or gallops. Respiratory:  Clear to auscultation bilateral. Normal respiratory effort Abdomen: positive bowel sounds and no masses, hernias; diffusely non tender to palpation, non distended Breasts: breasts appear normal, no suspicious masses, no skin or nipple changes or axillary nodes, and normal palpation. Neuro/Psych:  Normal mood and affect.  Skin:  Warm and dry.  Lymphatic:  No inguinal lymphadenopathy.   Pelvic exam: is not limited by body habitus EGBUS: within normal limits with b/l mild to moderate erythema on labia, Vagina: within normal limits and with no blood or discharge in the vault, Cervix: normal appearing cervix without tenderness, discharge or lesions. Uterus:  nonenlarged and non tender and Adnexa:  normal adnexa and no mass, fullness, tenderness Rectovaginal: deferred  Laboratory: none  Radiology: none  Assessment: pt doing well  Plan:  1. Encounter for gynecological examination Pt desires yearly paps, which was done today. She is using OCPs for birth control, but given her age and co-morbidities I recommend coming off them due to increased risks, such as MI, PE, CVA, HTN and they are much higher than small risk of pregnancy. Pt amenable to this and will do [place] level in one month s/p stopping OCPs to see if could be in menopausal range.   2. Yeast infection Recommend good BS control. Diflucan x 2 ordered   RTC 1 year  Durene Romans MD Attending Center for [employer] farm manager)

## 2017-02-01 LAB — CYTOLOGY - PAP
DIAGNOSIS: NEGATIVE
HPV: NOT DETECTED

## 2017-03-02 ENCOUNTER — Telehealth: Payer: Self-pay

## 2017-03-02 NOTE — Telephone Encounter (Signed)
Patient would like to know her results from 02/27/2017   what to do about them going forward. Please advise

## 2017-03-02 NOTE — Telephone Encounter (Signed)
Patient would like to know her results 

## 2017-03-07 ENCOUNTER — Telehealth: Payer: Self-pay | Admitting: Obstetrics and Gynecology

## 2017-03-07 NOTE — Telephone Encounter (Signed)
GYN Telephone Note  D[facility]w [facility] but recommend another value to confirm. Last period was when she stopped the OCPs when I saw her last. Patient amenable to plan.  Cornelia Copaharlie Alexee Delsanto, Jr MD Attending [facility] 03/07/2017 Time: [phone]

## 2017-03-07 NOTE — Telephone Encounter (Signed)
GYN Telephone Note Patient called at [phone] and generic VM picked up. VM left for pt to call clinic re: results.  Lori Carr, Jr MD Attending [facility] 03/07/2017 Time: [phone]

## 2017-04-05 DIAGNOSIS — E119 Type 2 diabetes mellitus without complications: Secondary | ICD-10-CM | POA: Diagnosis not present

## 2017-04-10 DIAGNOSIS — H5213 Myopia, bilateral: Secondary | ICD-10-CM | POA: Diagnosis not present

## 2017-04-12 DIAGNOSIS — R0602 Shortness of breath: Secondary | ICD-10-CM | POA: Diagnosis not present

## 2017-04-18 ENCOUNTER — Other Ambulatory Visit: Payer: Self-pay | Admitting: Family Medicine

## 2017-04-18 DIAGNOSIS — Z1231 Encounter for screening mammogram for malignant neoplasm of breast: Secondary | ICD-10-CM

## 2017-04-19 DIAGNOSIS — Z713 Dietary counseling and surveillance: Secondary | ICD-10-CM | POA: Diagnosis not present

## 2017-04-19 DIAGNOSIS — I10 Essential (primary) hypertension: Secondary | ICD-10-CM | POA: Diagnosis not present

## 2017-04-19 DIAGNOSIS — E669 Obesity, unspecified: Secondary | ICD-10-CM | POA: Diagnosis not present

## 2017-04-19 DIAGNOSIS — E119 Type 2 diabetes mellitus without complications: Secondary | ICD-10-CM | POA: Diagnosis not present

## 2017-04-24 ENCOUNTER — Telehealth: Payer: Self-pay | Admitting: *Deleted

## 2017-04-24 NOTE — Telephone Encounter (Signed)
Pt called in stating she has stopped her OCP's per last OV. However, her LH and FSH levels have been checked (see scanned document) and she is concerned she might get pregnant as she is still having cycles.  She would like to know to know if she should continue OCP's or what Dr. Tawni LevyPratt's opinion is. Advised pt that I would send Dr. Shawnie PonsPratt a message to get her input.

## 2017-04-25 NOTE — Telephone Encounter (Signed)
Spoke to pt this morning and went over contraception options. She will think about it and let us know what she decides.

## 2017-04-26 DIAGNOSIS — G4730 Sleep apnea, unspecified: Secondary | ICD-10-CM | POA: Diagnosis not present

## 2017-04-27 DIAGNOSIS — R0683 Snoring: Secondary | ICD-10-CM | POA: Insufficient documentation

## 2017-05-11 ENCOUNTER — Ambulatory Visit
Admission: RE | Admit: 2017-05-11 | Discharge: 2017-05-11 | Disposition: A | Discharge status: Home / Self Care | Payer: BLUE CROSS/BLUE SHIELD | Source: Ambulatory Visit | Attending: Family Medicine | Admitting: Family Medicine

## 2017-05-11 DIAGNOSIS — Z1231 Encounter for screening mammogram for malignant neoplasm of breast: Secondary | ICD-10-CM | POA: Diagnosis not present

## 2017-06-01 DIAGNOSIS — I368 Other nonrheumatic tricuspid valve disorders: Secondary | ICD-10-CM | POA: Diagnosis not present

## 2017-06-01 DIAGNOSIS — R9430 Abnormal result of cardiovascular function study, unspecified: Secondary | ICD-10-CM | POA: Diagnosis not present

## 2017-06-01 DIAGNOSIS — Z01810 Encounter for preprocedural cardiovascular examination: Secondary | ICD-10-CM | POA: Diagnosis not present

## 2017-06-01 DIAGNOSIS — Z01818 Encounter for other preprocedural examination: Secondary | ICD-10-CM | POA: Diagnosis not present

## 2017-06-01 DIAGNOSIS — E119 Type 2 diabetes mellitus without complications: Secondary | ICD-10-CM | POA: Diagnosis not present

## 2017-06-07 DIAGNOSIS — Z6836 Body mass index (BMI) 36.0-36.9, adult: Secondary | ICD-10-CM | POA: Diagnosis not present

## 2017-06-07 DIAGNOSIS — E119 Type 2 diabetes mellitus without complications: Secondary | ICD-10-CM | POA: Diagnosis not present

## 2017-06-07 DIAGNOSIS — Z6841 Body Mass Index (BMI) 40.0 and over, adult: Secondary | ICD-10-CM | POA: Insufficient documentation

## 2017-06-07 DIAGNOSIS — Z794 Long term (current) use of insulin: Secondary | ICD-10-CM | POA: Diagnosis not present

## 2017-06-12 DIAGNOSIS — K2970 Gastritis, unspecified, without bleeding: Secondary | ICD-10-CM | POA: Diagnosis not present

## 2017-06-12 DIAGNOSIS — Z880 Allergy status to penicillin: Secondary | ICD-10-CM | POA: Diagnosis not present

## 2017-06-12 DIAGNOSIS — Z6835 Body mass index (BMI) 35.0-35.9, adult: Secondary | ICD-10-CM | POA: Diagnosis not present

## 2017-06-12 DIAGNOSIS — Z9889 Other specified postprocedural states: Secondary | ICD-10-CM | POA: Diagnosis not present

## 2017-06-12 DIAGNOSIS — Z885 Allergy status to narcotic agent status: Secondary | ICD-10-CM | POA: Diagnosis not present

## 2017-06-12 DIAGNOSIS — Z01818 Encounter for other preprocedural examination: Secondary | ICD-10-CM | POA: Diagnosis not present

## 2017-06-12 DIAGNOSIS — E7800 Pure hypercholesterolemia, unspecified: Secondary | ICD-10-CM | POA: Diagnosis not present

## 2017-06-12 DIAGNOSIS — E119 Type 2 diabetes mellitus without complications: Secondary | ICD-10-CM | POA: Diagnosis not present

## 2017-06-12 DIAGNOSIS — Z794 Long term (current) use of insulin: Secondary | ICD-10-CM | POA: Diagnosis not present

## 2017-06-12 DIAGNOSIS — K2950 Unspecified chronic gastritis without bleeding: Secondary | ICD-10-CM | POA: Diagnosis not present

## 2017-06-12 DIAGNOSIS — I10 Essential (primary) hypertension: Secondary | ICD-10-CM | POA: Diagnosis not present

## 2017-06-15 ENCOUNTER — Telehealth: Payer: Self-pay

## 2017-06-15 MED ORDER — FLUCONAZOLE 150 MG PO TABS: ORAL_TABLET | ORAL | 1 refills | Status: DC

## 2017-06-15 NOTE — Telephone Encounter (Signed)
Call in diflucion for patient.

## 2017-06-15 NOTE — Telephone Encounter (Signed)
-----   Message from Lindell Spar, [place] sent at 06/15/2017 11:19 AM EDT ----- Regarding: refill for diflucan  [facility]

## 2017-06-21 DIAGNOSIS — Z6837 Body mass index (BMI) 37.0-37.9, adult: Secondary | ICD-10-CM | POA: Diagnosis not present

## 2017-06-21 DIAGNOSIS — Z794 Long term (current) use of insulin: Secondary | ICD-10-CM | POA: Diagnosis not present

## 2017-06-21 DIAGNOSIS — E119 Type 2 diabetes mellitus without complications: Secondary | ICD-10-CM | POA: Diagnosis not present

## 2017-06-23 DIAGNOSIS — Z01818 Encounter for other preprocedural examination: Secondary | ICD-10-CM | POA: Diagnosis not present

## 2017-08-16 ENCOUNTER — Encounter: Payer: Self-pay | Admitting: Family Medicine

## 2017-08-16 ENCOUNTER — Ambulatory Visit (INDEPENDENT_AMBULATORY_CARE_PROVIDER_SITE_OTHER): Payer: BLUE CROSS/BLUE SHIELD | Admitting: Family Medicine

## 2017-08-16 VITALS — BP 117/78 | HR 72 | Wt 194.4 lb

## 2017-08-16 DIAGNOSIS — N939 Abnormal uterine and vaginal bleeding, unspecified: Secondary | ICD-10-CM | POA: Diagnosis not present

## 2017-08-16 DIAGNOSIS — N950 Postmenopausal bleeding: Secondary | ICD-10-CM

## 2017-08-16 DIAGNOSIS — Z3202 Encounter for pregnancy test, result negative: Secondary | ICD-10-CM

## 2017-08-16 LAB — POCT URINE PREGNANCY: Preg Test, Ur: NEGATIVE

## 2017-08-16 NOTE — Progress Notes (Signed)
   GYNECOLOGY ANNUAL PREVENTATIVE CARE ENCOUNTER NOTE  Subjective:   Lori Carr is a 52 y.o. [phone] female here for a a problem GYN visit.  Current complaints: heavy bleeding, postmenopausal   Denies abnormal  discharge, pelvic pain, problems with intercourse or other gynecologic concerns.   Vaginal Bleeding:  Heavy bleeding for 1 month with clots, has been using super plus pads and soaking through. She reports stopping OCP in Dec 2018 after blood work showed she is postmenopausal. She reports having light monthly bleeding until this month where she had the heavy bleeding with clots. Denies other sx.  Of note mother has history of ovarian cancer and mutliple family members with breast cancer. Patient has had  testing which was negative per her report.    Gynecologic History Patient's last menstrual period was 08/16/2017. Contraception: none Last Pap: 2018 Results were: normal Last mammogram: 05/2017. Results were: normal  The following portions of the patient's history were reviewed and updated as appropriate: allergies, current medications, past family history, past medical history, past social history, past surgical history and problem list.  Review of Systems Pertinent items are noted in HPI.   Objective:  BP 117/78   Pulse 72   Wt 194 lb 6.4 oz (88.2 kg)   LMP 08/16/2017   BMI 35.56 kg/m   Gen: well appearing, NAD HEENT: no scleral icterus CV: RR Lung: Normal WOB Ext: warm well perfused GYN: normally appearing external genitalia. No inguinal LAD. Normal appearing mucosa and cervix. Thick dark clot extruding from cervical os.    ENDOMETRIAL BIOPSY     The indications for endometrial biopsy were reviewed.   Risks of the biopsy including cramping, bleeding, infection, uterine perforation, inadequate specimen and need for additional procedures  were discussed. The patient states she understands and agrees to undergo procedure today. Consent was signed. Time out was  performed. Urine HCG was negative. During the pelvic exam, the cervix was prepped with Betadine. A single-toothed tenaculum was placed on the anterior lip of the cervix to stabilize it. The 3 mm pipelle was introduced into the endometrial cavity without difficulty to a depth of 6cm, and a moderate amount of tissue was obtained and sent to pathology. The instruments were removed from the patient's vagina. Minimal bleeding from the cervix was noted. The patient tolerated the procedure well. Routine post-procedure instructions were given to the patient.     Assessment and Plan:   1. Postmenopausal bleeding ddx includes endometrial hyperplasia vs endometricalCA. Could be fibroid vs polyp as more benign etiologies. Patient discussed worry that she could be pregnant but review of FHS/LH and urine BCHG ruled this out. Discussed plan for work up and differential in detail. Patient agreed to endometrial bx today. - US Transvaginal Non-OB; Future - Surgical pathology   Please refer to After Visit Summary for other counseling recommendations.   Return in about 2 weeks (around 08/30/2017) for after US to review results.  Federico FlakeKimberly Niles Jac Romulus, MD, MPH, ABFM Attending Physician Faculty Practice- [facility]

## 2017-08-22 ENCOUNTER — Other Ambulatory Visit: Payer: Self-pay

## 2017-08-22 ENCOUNTER — Telehealth: Payer: Self-pay | Admitting: Family Medicine

## 2017-08-22 NOTE — Telephone Encounter (Signed)
Patient inform of normal lab results. Will follow up with Ultrasound.

## 2017-08-22 NOTE — Telephone Encounter (Signed)
Pt called for biopsy results. Please call pt back today per her request.

## 2017-08-24 ENCOUNTER — Ambulatory Visit
Admission: RE | Admit: 2017-08-24 | Discharge: 2017-08-24 | Disposition: A | Discharge status: Home / Self Care | Payer: BLUE CROSS/BLUE SHIELD | Source: Ambulatory Visit | Attending: Family Medicine | Admitting: Family Medicine

## 2017-08-24 DIAGNOSIS — N950 Postmenopausal bleeding: Secondary | ICD-10-CM

## 2017-08-24 DIAGNOSIS — N939 Abnormal uterine and vaginal bleeding, unspecified: Secondary | ICD-10-CM | POA: Diagnosis not present

## 2017-08-24 DIAGNOSIS — N83202 Unspecified ovarian cyst, left side: Secondary | ICD-10-CM | POA: Diagnosis not present

## 2017-08-30 ENCOUNTER — Ambulatory Visit (INDEPENDENT_AMBULATORY_CARE_PROVIDER_SITE_OTHER): Payer: BLUE CROSS/BLUE SHIELD | Admitting: Family Medicine

## 2017-08-30 ENCOUNTER — Encounter: Payer: Self-pay | Admitting: Family Medicine

## 2017-08-30 VITALS — BP 116/78 | HR 81 | Resp 16 | Ht 62.0 in | Wt 191.0 lb

## 2017-08-30 DIAGNOSIS — N950 Postmenopausal bleeding: Secondary | ICD-10-CM

## 2017-08-30 NOTE — Progress Notes (Addendum)
   GYNECOLOGY ANNUAL PREVENTATIVE CARE ENCOUNTER NOTE  Subjective:   Lori Carr is a 52 y.o. [phone] female here for follow up for postmenopausal bleeding-- TVUS showed thickened stripe and endometrial sampling was negative for dysplasia.  Current complaints: heavy bleeding, postmenopausal   Denies abnormal  discharge, pelvic pain, problems with intercourse or other gynecologic concerns.   Vaginal Bleeding:  Heavy bleeding has continued since last visit with clots. She reports stopping OCP in Dec 2018 after blood work showed she is postmenopausal. reviewed results of endometrial biopsy and TVUS   Gynecologic History Patient's last menstrual period was 08/16/2017 (exact date). Contraception: none Last Pap: 2018 Results were: normal Last mammogram: 05/2017. Results were: normal  The following portions of the patient's history were reviewed and updated as appropriate: allergies, current medications, past family history, past medical history, past social history, past surgical history and problem list.  Review of Systems Pertinent items are noted in HPI.   Objective:  BP 116/78 (BP Location: Left Arm, Patient Position: Sitting, Cuff Size: Normal)   Pulse 81   Resp 16   Ht 5\' 2"  (1.575 m)   Wt 191 lb (86.6 kg)   LMP 08/16/2017 (Exact Date)   BMI 34.93 kg/m   Gen: well appearing, NAD HEENT: no scleral icterus CV: RR Lung: Normal WOB Ext: warm well perfused  Assessment and Plan:   1. Postmenopausal bleeding - US showed thickened stripe (9mm) and endometrial sampling showed no dysplasia which are not consistent and given continued bleeding needs further evaluation.  - HSG ordered to examine for focal lesion - Recommended appt with GYN surgeon for discussion after HSG. Discussed possibility of ablation vs hysterectomy -08/31/17 Radiolog reports they cannot do HSG while bleeding. Rx sent for megace  Please refer to After Visit Summary for other counseling recommendations.    Return in about 2 weeks (around 09/13/2017) for postmenopausal bleeding. Future Appointments  Date Time Provider Department Center  09/01/2017  9:30 AM [facility]    Federico FlakeKimberly Niles Melvie Paglia, MD, MPH, ABFM Attending Physician Faculty Practice- [facility]

## 2017-08-31 MED ORDER — MEGESTROL ACETATE 40 MG PO TABS: ORAL_TABLET | ORAL | 3 refills | Status: DC

## 2017-08-31 NOTE — Addendum Note (Signed)
Addended by: Geanie BerlinNEWTON, Gerard Cantara N on: 08/31/2017 03:14 PM   Modules accepted: Orders

## 2017-09-01 ENCOUNTER — Encounter

## 2017-09-01 ENCOUNTER — Ambulatory Visit (HOSPITAL_COMMUNITY)
Admission: RE | Admit: 2017-09-01 | Discharge: 2017-09-01 | Disposition: A | Discharge status: Home / Self Care | Payer: BLUE CROSS/BLUE SHIELD | Source: Ambulatory Visit | Attending: Family Medicine | Admitting: Family Medicine

## 2017-09-01 DIAGNOSIS — R9389 Abnormal findings on diagnostic imaging of other specified body structures: Secondary | ICD-10-CM | POA: Diagnosis not present

## 2017-09-01 DIAGNOSIS — N950 Postmenopausal bleeding: Secondary | ICD-10-CM

## 2017-12-12 ENCOUNTER — Encounter: Payer: Self-pay | Admitting: Radiology

## 2018-02-28 DIAGNOSIS — Z6837 Body mass index (BMI) 37.0-37.9, adult: Secondary | ICD-10-CM | POA: Diagnosis not present

## 2018-02-28 DIAGNOSIS — E119 Type 2 diabetes mellitus without complications: Secondary | ICD-10-CM | POA: Diagnosis not present

## 2018-02-28 DIAGNOSIS — Z794 Long term (current) use of insulin: Secondary | ICD-10-CM | POA: Diagnosis not present

## 2018-03-28 DIAGNOSIS — H524 Presbyopia: Secondary | ICD-10-CM | POA: Diagnosis not present

## 2018-03-28 DIAGNOSIS — E119 Type 2 diabetes mellitus without complications: Secondary | ICD-10-CM | POA: Diagnosis not present

## 2018-04-04 ENCOUNTER — Other Ambulatory Visit: Payer: Self-pay

## 2018-04-05 LAB — CMP12+LP+TP+TSH+6AC+CBC/D/PLT
ALBUMIN: 3.9 g/dL (ref 3.8–4.9)
ALT: 11 IU/L (ref 0–32)
AST: 11 IU/L (ref 0–40)
Albumin/Globulin Ratio: 1.7 (ref 1.2–2.2)
Alkaline Phosphatase: 85 IU/L (ref 39–117)
BASOS ABS: 0 10*3/uL (ref 0.0–0.2)
BUN / CREAT RATIO: 21 (ref 9–23)
BUN: 14 mg/dL (ref 6–24)
Basos: 1 %
Bilirubin Total: 0.4 mg/dL (ref 0.0–1.2)
CALCIUM: 9 mg/dL (ref 8.7–10.2)
CHLORIDE: 101 mmol/L (ref 96–106)
CHOL/HDL RATIO: 3 ratio (ref 0.0–4.4)
CHOLESTEROL TOTAL: 186 mg/dL (ref 100–199)
Creatinine, Ser: 0.66 mg/dL (ref 0.57–1.00)
EOS (ABSOLUTE): 0.2 10*3/uL (ref 0.0–0.4)
EOS: 4 %
FREE THYROXINE INDEX: 1.8 (ref 1.2–4.9)
GFR calc non Af Amer: 103 mL/min/{1.73_m2} (ref >59)
GFR, EST AFRICAN AMERICAN: 118 mL/min/{1.73_m2} (ref >59)
GGT: 6 IU/L (ref 0–60)
Globulin, Total: 2.3 g/dL (ref 1.5–4.5)
Glucose: 137 mg/dL — ABNORMAL HIGH (ref 65–99)
HDL: 63 mg/dL (ref >39)
Hematocrit: 44.9 % (ref 34.0–46.6)
Hemoglobin: 14.7 g/dL (ref 11.1–15.9)
IMMATURE GRANS (ABS): 0 10*3/uL (ref 0.0–0.1)
IMMATURE GRANULOCYTES: 1 %
Iron: 79 ug/dL (ref 27–159)
LDH: 166 IU/L (ref 119–226)
LDL Calculated: 109 mg/dL — ABNORMAL HIGH (ref 0–99)
Lymphocytes Absolute: 2.4 10*3/uL (ref 0.7–3.1)
Lymphs: 36 %
MCH: 27.7 pg (ref 26.6–33.0)
MCHC: 32.7 g/dL (ref 31.5–35.7)
MCV: 85 fL (ref 79–97)
MONOCYTES: 7 %
Monocytes Absolute: 0.5 10*3/uL (ref 0.1–0.9)
Neutrophils Absolute: 3.5 10*3/uL (ref 1.4–7.0)
Neutrophils: 51 %
PHOSPHORUS: 3.5 mg/dL (ref 3.0–4.3)
Platelets: 355 10*3/uL (ref 150–450)
Potassium: 4.4 mmol/L (ref 3.5–5.2)
RBC: 5.31 x10E6/uL — ABNORMAL HIGH (ref 3.77–5.28)
RDW: 12.5 % (ref 11.7–15.4)
Sodium: 138 mmol/L (ref 134–144)
T3 Uptake Ratio: 25 % (ref 24–39)
T4, Total: 7.1 ug/dL (ref 4.5–12.0)
TSH: 2.23 u[IU]/mL (ref 0.450–4.500)
Total Protein: 6.2 g/dL (ref 6.0–8.5)
Triglycerides: 71 mg/dL (ref 0–149)
Uric Acid: 2.5 mg/dL (ref 2.5–7.1)
VLDL Cholesterol Cal: 14 mg/dL (ref 5–40)
WBC: 6.6 10*3/uL (ref 3.4–10.8)

## 2018-04-05 LAB — MICROALBUMIN / CREATININE URINE RATIO
Creatinine, Urine: 111.6 mg/dL
Microalb/Creat Ratio: 28 mg/g creat (ref 0–29)
Microalbumin, Urine: 30.7 ug/mL

## 2018-04-05 LAB — HGB A1C W/O EAG: Hgb A1c MFr Bld: 8.9 % — ABNORMAL HIGH (ref 4.8–5.6)

## 2018-04-06 ENCOUNTER — Telehealth: Payer: Self-pay | Admitting: Family Medicine

## 2018-04-06 NOTE — Telephone Encounter (Signed)
Called patient and discussed this. Initially, on OC's and they were stopped due to being over age 52. Since then has been having bleeding that has gotten progressively worse. Passing clots a lot. Bleeding all the time. Has been bleeding x 2 wks. Has never gone 1 year without a cycle but labs are indicative of being menopausal with FSH > 60. Given Megace, which she has not used. This is interfering with her work life and home life. Has had EMB which was negative and sonohyst, which just showed thickened endometrium. Having a duodenal switch for weight loss on 3/19. She cannot start megace. Offered her in office IUD which she will consider. Has appointment on 3/17 with me. Had labs and will send me results of these soon.

## 2018-04-06 NOTE — Telephone Encounter (Signed)
-----   Message from Olevia Bowens sent at 04/05/2018  4:15 PM EST ----- Regarding: FW: Phone Call Her called again :) ----- Message ----- From: Olevia Bowens Sent: 04/05/2018   1:52 PM EST To: Reva Bores, MD Subject: Phone Call                                     Patient called and wanted to speak to you about some test results from a procedure she has done. Declined wanting to speak to a nurse and declined to make an appointment. Last seen 07/31 by Alvester Morin, last seen by you in 2017, please advise.

## 2018-04-07 ENCOUNTER — Telehealth: Payer: Self-pay | Admitting: Family Medicine

## 2018-04-07 DIAGNOSIS — N950 Postmenopausal bleeding: Secondary | ICD-10-CM

## 2018-04-07 MED ORDER — MEGESTROL ACETATE 40 MG PO TABS: ORAL_TABLET | ORAL | 3 refills | Status: DC

## 2018-04-07 NOTE — Telephone Encounter (Signed)
Returned patients call on Saturday am.

## 2018-04-07 NOTE — Telephone Encounter (Signed)
Reached patient and she has continued to have bleeding. She has asked for OC's. Discussed risks with this and I continue to recommend Megace for bleeding. Start with 1 po bid, increase to 1 po tid and then up to 2 po tid prn if bleeding is unresponsive. She will pick up and start today.

## 2018-04-07 NOTE — Telephone Encounter (Signed)
-----   Message from Rozann Lesches, NT sent at 04/06/2018 11:47 AM EST ----- Regarding: patient is requesting a call back Sherri called back, says that she spoke with you at length but has had a change and needs to discuss with you asap, just needs 5 more minutes of your time please.   thanks

## 2018-04-09 ENCOUNTER — Encounter: Payer: Self-pay | Admitting: Radiology

## 2018-04-09 ENCOUNTER — Other Ambulatory Visit: Payer: Self-pay | Admitting: Internal Medicine

## 2018-04-09 DIAGNOSIS — Z1231 Encounter for screening mammogram for malignant neoplasm of breast: Secondary | ICD-10-CM

## 2018-04-10 DIAGNOSIS — Z01818 Encounter for other preprocedural examination: Secondary | ICD-10-CM | POA: Diagnosis not present

## 2018-04-10 DIAGNOSIS — E119 Type 2 diabetes mellitus without complications: Secondary | ICD-10-CM | POA: Diagnosis not present

## 2018-04-10 DIAGNOSIS — Z794 Long term (current) use of insulin: Secondary | ICD-10-CM | POA: Diagnosis not present

## 2018-04-12 DIAGNOSIS — Z113 Encounter for screening for infections with a predominantly sexual mode of transmission: Secondary | ICD-10-CM | POA: Diagnosis not present

## 2018-04-12 DIAGNOSIS — Z1151 Encounter for screening for human papillomavirus (HPV): Secondary | ICD-10-CM | POA: Diagnosis not present

## 2018-04-12 DIAGNOSIS — Z124 Encounter for screening for malignant neoplasm of cervix: Secondary | ICD-10-CM | POA: Diagnosis not present

## 2018-04-12 DIAGNOSIS — N950 Postmenopausal bleeding: Secondary | ICD-10-CM | POA: Diagnosis not present

## 2018-04-12 DIAGNOSIS — Z01419 Encounter for gynecological examination (general) (routine) without abnormal findings: Secondary | ICD-10-CM | POA: Diagnosis not present

## 2018-04-17 ENCOUNTER — Ambulatory Visit: Payer: Self-pay | Admitting: Family Medicine

## 2018-04-19 DIAGNOSIS — N764 Abscess of vulva: Secondary | ICD-10-CM | POA: Diagnosis not present

## 2018-05-09 DIAGNOSIS — N939 Abnormal uterine and vaginal bleeding, unspecified: Secondary | ICD-10-CM | POA: Diagnosis not present

## 2018-05-09 DIAGNOSIS — N950 Postmenopausal bleeding: Secondary | ICD-10-CM | POA: Diagnosis not present

## 2018-06-04 DIAGNOSIS — Z01818 Encounter for other preprocedural examination: Secondary | ICD-10-CM | POA: Diagnosis not present

## 2018-06-05 ENCOUNTER — Inpatient Hospital Stay
Admission: EM | Admit: 2018-06-05 | Discharge: 2018-06-08 | DRG: 638 | Disposition: A | Discharge status: Home / Self Care | Payer: BLUE CROSS/BLUE SHIELD | Attending: Internal Medicine | Admitting: Internal Medicine

## 2018-06-05 ENCOUNTER — Emergency Department: Payer: BLUE CROSS/BLUE SHIELD

## 2018-06-05 ENCOUNTER — Other Ambulatory Visit: Payer: Self-pay

## 2018-06-05 DIAGNOSIS — A419 Sepsis, unspecified organism: Secondary | ICD-10-CM | POA: Diagnosis not present

## 2018-06-05 DIAGNOSIS — E1110 Type 2 diabetes mellitus with ketoacidosis without coma: Secondary | ICD-10-CM | POA: Diagnosis present

## 2018-06-05 DIAGNOSIS — Z791 Long term (current) use of non-steroidal anti-inflammatories (NSAID): Secondary | ICD-10-CM

## 2018-06-05 DIAGNOSIS — Z79899 Other long term (current) drug therapy: Secondary | ICD-10-CM | POA: Diagnosis not present

## 2018-06-05 DIAGNOSIS — R600 Localized edema: Secondary | ICD-10-CM | POA: Diagnosis not present

## 2018-06-05 DIAGNOSIS — Z20828 Contact with and (suspected) exposure to other viral communicable diseases: Secondary | ICD-10-CM | POA: Diagnosis not present

## 2018-06-05 DIAGNOSIS — N390 Urinary tract infection, site not specified: Secondary | ICD-10-CM | POA: Diagnosis present

## 2018-06-05 DIAGNOSIS — N939 Abnormal uterine and vaginal bleeding, unspecified: Secondary | ICD-10-CM | POA: Diagnosis not present

## 2018-06-05 DIAGNOSIS — Z6835 Body mass index (BMI) 35.0-35.9, adult: Secondary | ICD-10-CM

## 2018-06-05 DIAGNOSIS — E1310 Other specified diabetes mellitus with ketoacidosis without coma: Secondary | ICD-10-CM | POA: Diagnosis not present

## 2018-06-05 DIAGNOSIS — Z8041 Family history of malignant neoplasm of ovary: Secondary | ICD-10-CM | POA: Diagnosis not present

## 2018-06-05 DIAGNOSIS — Z794 Long term (current) use of insulin: Secondary | ICD-10-CM

## 2018-06-05 DIAGNOSIS — Z885 Allergy status to narcotic agent status: Secondary | ICD-10-CM | POA: Diagnosis not present

## 2018-06-05 DIAGNOSIS — E669 Obesity, unspecified: Secondary | ICD-10-CM | POA: Diagnosis present

## 2018-06-05 DIAGNOSIS — E1010 Type 1 diabetes mellitus with ketoacidosis without coma: Principal | ICD-10-CM | POA: Diagnosis present

## 2018-06-05 DIAGNOSIS — R0602 Shortness of breath: Secondary | ICD-10-CM | POA: Diagnosis not present

## 2018-06-05 DIAGNOSIS — Z803 Family history of malignant neoplasm of breast: Secondary | ICD-10-CM | POA: Diagnosis not present

## 2018-06-05 DIAGNOSIS — Z880 Allergy status to penicillin: Secondary | ICD-10-CM

## 2018-06-05 DIAGNOSIS — N179 Acute kidney failure, unspecified: Secondary | ICD-10-CM | POA: Diagnosis present

## 2018-06-05 DIAGNOSIS — R079 Chest pain, unspecified: Secondary | ICD-10-CM | POA: Diagnosis not present

## 2018-06-05 DIAGNOSIS — E860 Dehydration: Secondary | ICD-10-CM | POA: Diagnosis present

## 2018-06-05 DIAGNOSIS — E876 Hypokalemia: Secondary | ICD-10-CM | POA: Diagnosis not present

## 2018-06-05 DIAGNOSIS — Z7982 Long term (current) use of aspirin: Secondary | ICD-10-CM | POA: Diagnosis not present

## 2018-06-05 DIAGNOSIS — R000 Tachycardia, unspecified: Secondary | ICD-10-CM | POA: Diagnosis not present

## 2018-06-05 DIAGNOSIS — I4581 Long QT syndrome: Secondary | ICD-10-CM | POA: Diagnosis not present

## 2018-06-05 DIAGNOSIS — D72829 Elevated white blood cell count, unspecified: Secondary | ICD-10-CM | POA: Diagnosis not present

## 2018-06-05 DIAGNOSIS — Z03818 Encounter for observation for suspected exposure to other biological agents ruled out: Secondary | ICD-10-CM | POA: Diagnosis not present

## 2018-06-05 DIAGNOSIS — R52 Pain, unspecified: Secondary | ICD-10-CM | POA: Diagnosis not present

## 2018-06-05 DIAGNOSIS — I10 Essential (primary) hypertension: Secondary | ICD-10-CM | POA: Diagnosis not present

## 2018-06-05 MED ORDER — ONDANSETRON HCL 4 MG/2ML IJ SOLN
4.0000 mg | Freq: Once | INTRAMUSCULAR | Status: AC
  Administered 2018-06-05: 4 mg via INTRAVENOUS
  Filled 2018-06-05: qty 2

## 2018-06-05 MED ORDER — SODIUM CHLORIDE 0.9 % IV BOLUS
1000.0000 mL | Freq: Once | INTRAVENOUS | Status: AC
  Administered 2018-06-05: 1000 mL via INTRAVENOUS

## 2018-06-05 MED ORDER — MORPHINE SULFATE (PF) 2 MG/ML IV SOLN
2.0000 mg | Freq: Once | INTRAVENOUS | Status: AC
  Administered 2018-06-05: 2 mg via INTRAVENOUS
  Filled 2018-06-05: qty 1

## 2018-06-05 NOTE — ED Provider Notes (Signed)
[facility] Emergency Department Provider Note    First MD Initiated Contact with Patient 06/05/18 2331     (approximate)  I have reviewed the triage vital signs and the nursing notes.   HISTORY  Chief Complaint Shortness of Breath    HPI Lori Carr is a 53 y.o. female with history of diabetes presents to the emergency department with 3-day history of progressive dyspnea and bilateral "rib pain.  Patient denies any lower extremity pain or swelling.  Patient denies any nausea vomiting diarrhea constipation.       Past Medical History:  Diagnosis Date  . Diabetes mellitus   . Obesity     Patient Active Problem List   Diagnosis Date Noted  . DKA (diabetic ketoacidoses) (HCC) 06/06/2018  . AKI (acute kidney injury) (HCC) 06/06/2018  . Postmenopausal bleeding 08/16/2017  . Dupuytren's contracture of foot 06/05/2013  . Diabetes mellitus 09/06/2010    Past Surgical History:  Procedure Laterality Date  . CESAREAN SECTION     x3    Prior to Admission medications   Medication Sig Start Date End Date Taking? Authorizing Provider  BAYER CONTOUR TEST test strip  08/20/10   [provider]  Insulin Glargine (LANTUS [place]) Inject into the skin.      [provider]  megestrol (MEGACE) 40 MG tablet Take two tablets three times daily for 3 days, then two tablets twice daily for 3 days, then one tablet twice daily 04/07/18   Reva Bores, MD  meloxicam (MOBIC) 15 MG tablet Take 1 tablet (15 mg total) by mouth daily. 08/07/16   Cuthriell, Delorise Royals, PA-C  metFORMIN (GLUCOPHAGE) 1000 MG tablet Take 1 tablet by mouth daily.    [provider]  NOVOLOG 100 UNIT/ML injection  05/25/13   [provider]  pantoprazole (PROTONIX) 40 MG tablet Take 1 tablet by mouth as needed. 06/21/17 12/18/17  [provider]    Allergies Amoxicillin-pot clavulanate and Vicodin [hydrocodone-acetaminophen]  Family History  Problem  Relation Age of Onset  . Cancer Mother 22       deceased age 78 stage IV ovarian cancer  . Breast cancer Maternal Aunt 50  . Breast cancer Paternal Grandmother 33  . Breast cancer Paternal Aunt 26  . Stroke Brother     Social History Social History   Tobacco Use  . Smoking status: Never Smoker  . Smokeless tobacco: Never Used  Substance Use Topics  . Alcohol use: No  . Drug use: No    Review of Systems Constitutional: No fever/chills Eyes: No visual changes. ENT: No sore throat. Cardiovascular: Positive for chest pain. Respiratory: Positive for dyspnea Gastrointestinal: No abdominal pain.  No nausea, no vomiting.  No diarrhea.  No constipation. Genitourinary: Negative for dysuria. Musculoskeletal: Negative for neck pain.  Negative for back pain. Integumentary: Negative for rash. Neurological: Negative for headaches, focal weakness or numbness.  ____________________________________________   PHYSICAL EXAM:  VITAL SIGNS: ED Triage Vitals  Enc Vitals Group     BP 06/05/18 2229 (!) 168/105     Pulse Rate 06/05/18 2229 (!) 134     Resp 06/05/18 2229 (!) 28     Temp 06/05/18 2229 97.8 F (36.6 C)     Temp Source 06/05/18 2229 Oral     SpO2 06/05/18 2229 98 %     Weight 06/05/18 2225 83.9 kg (185 lb)     Height 06/05/18 2225 1.549 m ( )     Head Circumference --  Peak Flow --      Pain Score 06/05/18 2224 8     Pain Loc --      Pain Edu? --      Excl. in GC? --     Constitutional: Alert and oriented.  Ill-appearing Eyes: Conjunctivae are normal. Mouth/Throat: Mucous membranes are dry. Oropharynx non-erythematous. Neck: No stridor.   Cardiovascular: Tachycardia, regular rhythm. Good peripheral circulation. Grossly normal heart sounds. Respiratory: Tachypnea, K[place]ul respirations no retractions. No audible wheezing. Gastrointestinal: Soft and nontender. No distention.  Musculoskeletal: No lower extremity tenderness nor edema. No gross deformities of  extremities. Neurologic:  Normal speech and language. No gross focal neurologic deficits are appreciated.  Skin:  Skin is warm, dry and intact. No rash noted. Psychiatric: Mood and affect are normal. Speech and behavior are normal.  ____________________________________________   LABS (all labs ordered are listed, but only abnormal results are displayed)  Labs Reviewed  CBC WITH DIFFERENTIAL/PLATELET - Abnormal; Notable for the following components:      Result Value   WBC 23.4 (*)    MCH 25.4 (*)    MCHC 29.8 (*)    Platelets 662 (*)    Neutro Abs 20.3 (*)    Abs Immature Granulocytes 0.63 (*)    All other components within normal limits  BASIC METABOLIC PANEL - Abnormal; Notable for the following components:   Chloride 115 (*)    CO2 <7 (*)    Glucose, Bld 298 (*)    BUN 33 (*)    Creatinine, Ser 1.25 (*)    GFR calc non Af Amer 50 (*)    GFR calc Af Amer 58 (*)    All other components within normal limits  FIBRIN DERIVATIVES D-DIMER ([facility] ONLY) - Abnormal; Notable for the following components:   Fibrin derivatives D-dimer ([facility]) 830.44 (*)    All other components within normal limits  BLOOD GAS, VENOUS - Abnormal; Notable for the following components:   pH, Ven 6.95 (*)    pO2, Ven 55.0 (*)    Bicarbonate 3.1 (*)    Acid-base deficit 27.5 (*)    All other components within normal limits  SARS CORONAVIRUS 2 (HOSPITAL ORDER, PERFORMED IN [facility])  CULTURE, BLOOD (ROUTINE X 2)  CULTURE, BLOOD (ROUTINE X 2)  URINE CULTURE  BRAIN NATRIURETIC PEPTIDE  LACTIC ACID, PLASMA  LACTIC ACID, PLASMA  URINALYSIS, ROUTINE W REFLEX MICROSCOPIC  PROCALCITONIN  BETA-HYDROXYBUTYRIC ACID  POC URINE PREG, ED   ____________________________________________  EKG  ED ECG REPORT I, [place] N , the attending physician, personally viewed and interpreted this ECG.   Date: 06/05/2018  EKG Time: 10:24 PM  Rate: 140  Rhythm: Sinus tachycardia  Axis: Normal   Intervals:: Prolonged QTC 534  ST&T Change: None  ____________________________________________  RADIOLOGY I, [place] N , personally viewed and evaluated these images (plain radiographs) as part of my medical decision making, as well as reviewing the written report by the radiologist.  ED MD interpretation: Negative chest x-ray per radiologist  Official radiology report(s): Dg Chest Port 1 View  Result Date: 06/05/2018 CLINICAL DATA:  Shortness of breath EXAM: PORTABLE CHEST 1 VIEW COMPARISON:  None. FINDINGS: Heart and mediastinal contours are within normal limits. No focal opacities or effusions. No acute bony abnormality. IMPRESSION: No active disease. Electronically Signed   By: Charlett NoseKevin  Dover M.D.   On: 06/05/2018 23:19    ____________________________________________   PROCEDURES   Procedure(s) performed (including Critical Care):  .Critical Care Performed by: Manson PasseyBrown,  Enedina Finner, MD Authorized by: Darci Current, MD   Critical care provider statement:    Critical care time (minutes):  60   Critical care time was exclusive of:  Separately billable procedures and treating other patients (dka)   Critical care was necessary to treat or prevent imminent or life-threatening deterioration of the following conditions:  Endocrine crisis   Critical care was time spent personally by me on the following activities:  Development of treatment plan with patient or surrogate, discussions with consultants, evaluation of patient's response to treatment, examination of patient, obtaining history from patient or surrogate, ordering and performing treatments and interventions, ordering and review of laboratory studies, ordering and review of radiographic studies, pulse oximetry, re-evaluation of patient's condition and review of old charts   I assumed direction of critical care for this patient from another provider in my specialty: no       ____________________________________________    INITIAL IMPRESSION / MDM / ASSESSMENT AND PLAN / ED COURSE  As part of my medical decision making, I reviewed the following data within the electronic MEDICAL RECORD NUMBER   53 year old female presenting with above-stated history and physical exam with concern for possible diabetic ketoacidosis.  Patient received 2 L IV normal saline, insulin infusion began after laboratory data revealed findings consistent with DKA.  Glucose of 298 elevated anion gap bicarb of 3 pH 6.95.  Patient given 2 A of sodium bicarb.  Patient discussed with Dr. Anne Hahn for hospital admission further evaluation and management of DKA  *Lori Carr was evaluated in Emergency Department on 06/06/2018 for the symptoms described in the history of present illness. She was evaluated in the context of the global COVID-19 pandemic, which necessitated consideration that the patient might be at risk for infection with the SARS-CoV-2 virus that causes COVID-19. Institutional protocols and algorithms that pertain to the evaluation of patients at risk for COVID-19 are in a state of rapid change based on information released by regulatory bodies including the CDC and federal and state organizations. These policies and algorithms were followed during the patient's care in the ED.*    Clinical Course as of Jun 06 222  Wed Jun 06, 2018  0136 Anion gap: NOT CALCULATED [RB]    Clinical Course User Index [RB] Darci Current, MD     ____________________________________________  FINAL CLINICAL IMPRESSION(S) / ED DIAGNOSES  Final diagnoses:  Type 1 diabetes mellitus with ketoacidosis without coma (HCC)     MEDICATIONS GIVEN DURING THIS VISIT:  Medications  metroNIDAZOLE (FLAGYL) IVPB 500 mg (has no administration in time range)  vancomycin (VANCOCIN) 1,500 mg in sodium chloride 0.9 % 500 mL IVPB (has no administration in time range)  insulin regular, human (MYXREDLIN) 100 units/ 100 mL infusion (has no administration in time range)   potassium chloride 10 mEq in 100 mL IVPB (has no administration in time range)  sodium bicarbonate 1 mEq/mL injection (has no administration in time range)  ceFEPIme (MAXIPIME) 2 g in sodium chloride 0.9 % 100 mL IVPB (2 g Intravenous New Bag/Given 06/06/18 0216)  sodium bicarbonate 150 mEq in sterile water 1,000 mL infusion (has no administration in time range)  sodium chloride 0.9 % bolus 1,000 mL (1,000 mLs Intravenous New Bag/Given 06/05/18 2359)  sodium chloride 0.9 % bolus 1,000 mL (1,000 mLs Intravenous New Bag/Given 06/05/18 2352)  morphine 2 MG/ML injection 2 mg (2 mg Intravenous Given 06/05/18 2355)  ondansetron (ZOFRAN) injection 4 mg (4 mg Intravenous Given 06/05/18 2354)  ED Discharge Orders    None       Note:  This document was prepared using Dragon voice recognition software and may include unintentional dictation errors.   Darci Current, MD 06/06/18 [phone]

## 2018-06-06 ENCOUNTER — Inpatient Hospital Stay: Payer: BLUE CROSS/BLUE SHIELD

## 2018-06-06 DIAGNOSIS — Z803 Family history of malignant neoplasm of breast: Secondary | ICD-10-CM | POA: Diagnosis not present

## 2018-06-06 DIAGNOSIS — Z8041 Family history of malignant neoplasm of ovary: Secondary | ICD-10-CM | POA: Diagnosis not present

## 2018-06-06 DIAGNOSIS — E1010 Type 1 diabetes mellitus with ketoacidosis without coma: Secondary | ICD-10-CM | POA: Diagnosis not present

## 2018-06-06 DIAGNOSIS — E876 Hypokalemia: Secondary | ICD-10-CM | POA: Diagnosis not present

## 2018-06-06 DIAGNOSIS — A419 Sepsis, unspecified organism: Secondary | ICD-10-CM | POA: Diagnosis not present

## 2018-06-06 DIAGNOSIS — N179 Acute kidney failure, unspecified: Secondary | ICD-10-CM | POA: Diagnosis present

## 2018-06-06 DIAGNOSIS — E1110 Type 2 diabetes mellitus with ketoacidosis without coma: Secondary | ICD-10-CM | POA: Diagnosis not present

## 2018-06-06 DIAGNOSIS — Z794 Long term (current) use of insulin: Secondary | ICD-10-CM | POA: Diagnosis not present

## 2018-06-06 DIAGNOSIS — R600 Localized edema: Secondary | ICD-10-CM | POA: Diagnosis not present

## 2018-06-06 DIAGNOSIS — R079 Chest pain, unspecified: Secondary | ICD-10-CM | POA: Diagnosis not present

## 2018-06-06 DIAGNOSIS — Z20828 Contact with and (suspected) exposure to other viral communicable diseases: Secondary | ICD-10-CM | POA: Diagnosis present

## 2018-06-06 DIAGNOSIS — Z880 Allergy status to penicillin: Secondary | ICD-10-CM | POA: Diagnosis not present

## 2018-06-06 DIAGNOSIS — N390 Urinary tract infection, site not specified: Secondary | ICD-10-CM | POA: Diagnosis present

## 2018-06-06 DIAGNOSIS — N939 Abnormal uterine and vaginal bleeding, unspecified: Secondary | ICD-10-CM | POA: Diagnosis present

## 2018-06-06 DIAGNOSIS — Z791 Long term (current) use of non-steroidal anti-inflammatories (NSAID): Secondary | ICD-10-CM | POA: Diagnosis not present

## 2018-06-06 DIAGNOSIS — E669 Obesity, unspecified: Secondary | ICD-10-CM | POA: Diagnosis present

## 2018-06-06 DIAGNOSIS — E1310 Other specified diabetes mellitus with ketoacidosis without coma: Secondary | ICD-10-CM | POA: Diagnosis not present

## 2018-06-06 DIAGNOSIS — E860 Dehydration: Secondary | ICD-10-CM | POA: Diagnosis present

## 2018-06-06 DIAGNOSIS — Z885 Allergy status to narcotic agent status: Secondary | ICD-10-CM | POA: Diagnosis not present

## 2018-06-06 DIAGNOSIS — D72829 Elevated white blood cell count, unspecified: Secondary | ICD-10-CM | POA: Diagnosis not present

## 2018-06-06 DIAGNOSIS — Z79899 Other long term (current) drug therapy: Secondary | ICD-10-CM | POA: Diagnosis not present

## 2018-06-06 DIAGNOSIS — R0602 Shortness of breath: Secondary | ICD-10-CM | POA: Diagnosis present

## 2018-06-06 DIAGNOSIS — Z6835 Body mass index (BMI) 35.0-35.9, adult: Secondary | ICD-10-CM | POA: Diagnosis not present

## 2018-06-06 DIAGNOSIS — Z7982 Long term (current) use of aspirin: Secondary | ICD-10-CM | POA: Diagnosis not present

## 2018-06-06 LAB — URINE DRUG SCREEN, QUALITATIVE (ARMC ONLY)
Amphetamines, Ur Screen: NOT DETECTED
Barbiturates, Ur Screen: NOT DETECTED
Benzodiazepine, Ur Scrn: NOT DETECTED
Cannabinoid 50 Ng, Ur ~~LOC~~: NOT DETECTED
Cocaine Metabolite,Ur ~~LOC~~: NOT DETECTED
MDMA (Ecstasy)Ur Screen: NOT DETECTED
Methadone Scn, Ur: NOT DETECTED
Opiate, Ur Screen: NOT DETECTED
Phencyclidine (PCP) Ur S: NOT DETECTED
Tricyclic, Ur Screen: NOT DETECTED

## 2018-06-06 LAB — BASIC METABOLIC PANEL
Anion gap: 13 (ref 5–15)
Anion gap: 14 (ref 5–15)
Anion gap: 14 (ref 5–15)
BUN: 33 mg/dL — ABNORMAL HIGH (ref 6–20)
BUN: 35 mg/dL — ABNORMAL HIGH (ref 6–20)
BUN: 34 mg/dL — ABNORMAL HIGH (ref 6–20)
BUN: 31 mg/dL — ABNORMAL HIGH (ref 6–20)
BUN: 30 mg/dL — ABNORMAL HIGH (ref 6–20)
BUN: 28 mg/dL — ABNORMAL HIGH (ref 6–20)
CO2: <7 mmol/L — ABNORMAL LOW (ref 22–32)
CO2: <7 mmol/L — ABNORMAL LOW (ref 22–32)
CO2: <7 mmol/L — ABNORMAL LOW (ref 22–32)
CO2: 11 mmol/L — ABNORMAL LOW (ref 22–32)
CO2: 11 mmol/L — ABNORMAL LOW (ref 22–32)
CO2: 14 mmol/L — ABNORMAL LOW (ref 22–32)
Calcium: 9.1 mg/dL (ref 8.9–10.3)
Calcium: 8.3 mg/dL — ABNORMAL LOW (ref 8.9–10.3)
Calcium: 7.9 mg/dL — ABNORMAL LOW (ref 8.9–10.3)
Calcium: 7.9 mg/dL — ABNORMAL LOW (ref 8.9–10.3)
Calcium: 7.7 mg/dL — ABNORMAL LOW (ref 8.9–10.3)
Calcium: 7.7 mg/dL — ABNORMAL LOW (ref 8.9–10.3)
Chloride: 115 mmol/L — ABNORMAL HIGH (ref 98–111)
Chloride: 120 mmol/L — ABNORMAL HIGH (ref 98–111)
Chloride: 125 mmol/L — ABNORMAL HIGH (ref 98–111)
Chloride: 121 mmol/L — ABNORMAL HIGH (ref 98–111)
Chloride: 119 mmol/L — ABNORMAL HIGH (ref 98–111)
Chloride: 116 mmol/L — ABNORMAL HIGH (ref 98–111)
Creatinine, Ser: 1.25 mg/dL — ABNORMAL HIGH (ref 0.44–1.00)
Creatinine, Ser: 1.15 mg/dL — ABNORMAL HIGH (ref 0.44–1.00)
Creatinine, Ser: 1.21 mg/dL — ABNORMAL HIGH (ref 0.44–1.00)
Creatinine, Ser: 0.99 mg/dL (ref 0.44–1.00)
Creatinine, Ser: 0.94 mg/dL (ref 0.44–1.00)
Creatinine, Ser: 0.84 mg/dL (ref 0.44–1.00)
GFR calc Af Amer: 58 mL/min — ABNORMAL LOW (ref >60)
GFR calc Af Amer: >60 mL/min (ref >60)
GFR calc Af Amer: 60 mL/min — ABNORMAL LOW (ref >60)
GFR calc Af Amer: >60 mL/min (ref >60)
GFR calc Af Amer: >60 mL/min (ref >60)
GFR calc Af Amer: >60 mL/min (ref >60)
GFR calc non Af Amer: 50 mL/min — ABNORMAL LOW (ref >60)
GFR calc non Af Amer: 55 mL/min — ABNORMAL LOW (ref >60)
GFR calc non Af Amer: 52 mL/min — ABNORMAL LOW (ref >60)
GFR calc non Af Amer: >60 mL/min (ref >60)
GFR calc non Af Amer: >60 mL/min (ref >60)
GFR calc non Af Amer: >60 mL/min (ref >60)
Glucose, Bld: 298 mg/dL — ABNORMAL HIGH (ref 70–99)
Glucose, Bld: 304 mg/dL — ABNORMAL HIGH (ref 70–99)
Glucose, Bld: 223 mg/dL — ABNORMAL HIGH (ref 70–99)
Glucose, Bld: 145 mg/dL — ABNORMAL HIGH (ref 70–99)
Glucose, Bld: 165 mg/dL — ABNORMAL HIGH (ref 70–99)
Glucose, Bld: 188 mg/dL — ABNORMAL HIGH (ref 70–99)
Potassium: 5 mmol/L (ref 3.5–5.1)
Potassium: 4.2 mmol/L (ref 3.5–5.1)
Potassium: 3.5 mmol/L (ref 3.5–5.1)
Potassium: 3 mmol/L — ABNORMAL LOW (ref 3.5–5.1)
Potassium: 2.9 mmol/L — ABNORMAL LOW (ref 3.5–5.1)
Potassium: 3.1 mmol/L — ABNORMAL LOW (ref 3.5–5.1)
Sodium: 140 mmol/L (ref 135–145)
Sodium: 146 mmol/L — ABNORMAL HIGH (ref 135–145)
Sodium: 147 mmol/L — ABNORMAL HIGH (ref 135–145)
Sodium: 145 mmol/L (ref 135–145)
Sodium: 144 mmol/L (ref 135–145)
Sodium: 144 mmol/L (ref 135–145)

## 2018-06-06 LAB — POCT PREGNANCY, URINE: Preg Test, Ur: NEGATIVE

## 2018-06-06 LAB — URINALYSIS, ROUTINE W REFLEX MICROSCOPIC
Bilirubin Urine: NEGATIVE
Glucose, UA: >=500 mg/dL — AB
Ketones, ur: 80 mg/dL — AB
Nitrite: NEGATIVE
Protein, ur: 30 mg/dL — AB
Specific Gravity, Urine: 1.012 (ref 1.005–1.030)
pH: 5 (ref 5.0–8.0)

## 2018-06-06 LAB — CBC
HCT: 40.3 % (ref 36.0–46.0)
Hemoglobin: 11.8 g/dL — ABNORMAL LOW (ref 12.0–15.0)
MCH: 25.4 pg — ABNORMAL LOW (ref 26.0–34.0)
MCHC: 29.3 g/dL — ABNORMAL LOW (ref 30.0–36.0)
MCV: 86.9 fL (ref 80.0–100.0)
Platelets: 604 10*3/uL — ABNORMAL HIGH (ref 150–400)
RBC: 4.64 MIL/uL (ref 3.87–5.11)
RDW: 13.9 % (ref 11.5–15.5)
WBC: 32.7 10*3/uL — ABNORMAL HIGH (ref 4.0–10.5)
nRBC: 0 % (ref 0.0–0.2)

## 2018-06-06 LAB — GLUCOSE, CAPILLARY
Glucose-Capillary: 292 mg/dL — ABNORMAL HIGH (ref 70–99)
Glucose-Capillary: 281 mg/dL — ABNORMAL HIGH (ref 70–99)
Glucose-Capillary: 292 mg/dL — ABNORMAL HIGH (ref 70–99)
Glucose-Capillary: 217 mg/dL — ABNORMAL HIGH (ref 70–99)
Glucose-Capillary: 211 mg/dL — ABNORMAL HIGH (ref 70–99)
Glucose-Capillary: 205 mg/dL — ABNORMAL HIGH (ref 70–99)
Glucose-Capillary: 197 mg/dL — ABNORMAL HIGH (ref 70–99)
Glucose-Capillary: 150 mg/dL — ABNORMAL HIGH (ref 70–99)
Glucose-Capillary: 138 mg/dL — ABNORMAL HIGH (ref 70–99)
Glucose-Capillary: 123 mg/dL — ABNORMAL HIGH (ref 70–99)
Glucose-Capillary: 126 mg/dL — ABNORMAL HIGH (ref 70–99)
Glucose-Capillary: 130 mg/dL — ABNORMAL HIGH (ref 70–99)
Glucose-Capillary: 136 mg/dL — ABNORMAL HIGH (ref 70–99)
Glucose-Capillary: 148 mg/dL — ABNORMAL HIGH (ref 70–99)
Glucose-Capillary: 158 mg/dL — ABNORMAL HIGH (ref 70–99)
Glucose-Capillary: 157 mg/dL — ABNORMAL HIGH (ref 70–99)
Glucose-Capillary: 153 mg/dL — ABNORMAL HIGH (ref 70–99)

## 2018-06-06 LAB — CBC WITH DIFFERENTIAL/PLATELET
Abs Immature Granulocytes: 0.63 10*3/uL — ABNORMAL HIGH (ref 0.00–0.07)
Basophils Absolute: 0.1 10*3/uL (ref 0.0–0.1)
Basophils Relative: 1 %
Eosinophils Absolute: 0 10*3/uL (ref 0.0–0.5)
Eosinophils Relative: 0 %
HCT: 43 % (ref 36.0–46.0)
Hemoglobin: 12.8 g/dL (ref 12.0–15.0)
Immature Granulocytes: 3 %
Lymphocytes Relative: 6 %
Lymphs Abs: 1.5 10*3/uL (ref 0.7–4.0)
MCH: 25.4 pg — ABNORMAL LOW (ref 26.0–34.0)
MCHC: 29.8 g/dL — ABNORMAL LOW (ref 30.0–36.0)
MCV: 85.3 fL (ref 80.0–100.0)
Monocytes Absolute: 0.8 10*3/uL (ref 0.1–1.0)
Monocytes Relative: 4 %
Neutro Abs: 20.3 10*3/uL — ABNORMAL HIGH (ref 1.7–7.7)
Neutrophils Relative %: 86 %
Platelets: 662 10*3/uL — ABNORMAL HIGH (ref 150–400)
RBC: 5.04 MIL/uL (ref 3.87–5.11)
RDW: 13.8 % (ref 11.5–15.5)
WBC: 23.4 10*3/uL — ABNORMAL HIGH (ref 4.0–10.5)
nRBC: 0 % (ref 0.0–0.2)

## 2018-06-06 LAB — SARS CORONAVIRUS 2 BY RT PCR (HOSPITAL ORDER, PERFORMED IN ~~LOC~~ HOSPITAL LAB): SARS Coronavirus 2: NEGATIVE

## 2018-06-06 LAB — MRSA PCR SCREENING: MRSA by PCR: POSITIVE — AB

## 2018-06-06 LAB — LIPASE, BLOOD: Lipase: 75 U/L — ABNORMAL HIGH (ref 11–51)

## 2018-06-06 LAB — LACTIC ACID, PLASMA
Lactic Acid, Venous: 2.7 mmol/L (ref 0.5–1.9)
Lactic Acid, Venous: 2.5 mmol/L (ref 0.5–1.9)

## 2018-06-06 LAB — FIBRIN DERIVATIVES D-DIMER (ARMC ONLY): Fibrin derivatives D-dimer (ARMC): 830.44 ng/mL (FEU) — ABNORMAL HIGH (ref 0.00–499.00)

## 2018-06-06 LAB — BRAIN NATRIURETIC PEPTIDE: B Natriuretic Peptide: 39 pg/mL (ref 0.0–100.0)

## 2018-06-06 LAB — AMYLASE: Amylase: 120 U/L — ABNORMAL HIGH (ref 28–100)

## 2018-06-06 LAB — BETA-HYDROXYBUTYRIC ACID: Beta-Hydroxybutyric Acid: >8 mmol/L — ABNORMAL HIGH (ref 0.05–0.27)

## 2018-06-06 LAB — PROCALCITONIN: Procalcitonin: 0.17 ng/mL

## 2018-06-06 MED ORDER — ENOXAPARIN SODIUM 40 MG/0.4ML ~~LOC~~ SOLN
40.0000 mg | SUBCUTANEOUS | Status: DC
  Administered 2018-06-07 – 2018-06-08 (×2): 40 mg via SUBCUTANEOUS
  Filled 2018-06-06 (×2): qty 0.4

## 2018-06-06 MED ORDER — POTASSIUM CHLORIDE 10 MEQ/100ML IV SOLN
10.0000 meq | INTRAVENOUS | Status: DC
  Filled 2018-06-06 (×2): qty 100

## 2018-06-06 MED ORDER — VANCOMYCIN HCL IN DEXTROSE 1-5 GM/200ML-% IV SOLN: 1000.0000 mg | Freq: Once | INTRAVENOUS | Status: DC

## 2018-06-06 MED ORDER — SODIUM BICARBONATE 8.4 % IV SOLN
100.0000 meq | Freq: Once | INTRAVENOUS | Status: AC
  Administered 2018-06-06: 03:00:00 100 meq via INTRAVENOUS

## 2018-06-06 MED ORDER — METRONIDAZOLE IN NACL 5-0.79 MG/ML-% IV SOLN
500.0000 mg | Freq: Three times a day (TID) | INTRAVENOUS | Status: DC
  Administered 2018-06-06: 500 mg via INTRAVENOUS
  Filled 2018-06-06 (×2): qty 100

## 2018-06-06 MED ORDER — POTASSIUM CHLORIDE CRYS ER 20 MEQ PO TBCR
40.0000 meq | EXTENDED_RELEASE_TABLET | ORAL | Status: AC
  Administered 2018-06-06 (×2): 40 meq via ORAL
  Filled 2018-06-06 (×2): qty 2

## 2018-06-06 MED ORDER — MUPIROCIN 2 % EX OINT
1.0000 "application " | TOPICAL_OINTMENT | Freq: Two times a day (BID) | CUTANEOUS | Status: DC
  Administered 2018-06-06 – 2018-06-08 (×5): 1 via NASAL
  Filled 2018-06-06 (×2): qty 22

## 2018-06-06 MED ORDER — SODIUM CHLORIDE 0.9 % IV SOLN: INTRAVENOUS | Status: DC

## 2018-06-06 MED ORDER — SODIUM BICARBONATE 8.4 % IV SOLN
INTRAVENOUS | Status: AC
  Administered 2018-06-06: 100 meq via INTRAVENOUS
  Filled 2018-06-06: qty 100

## 2018-06-06 MED ORDER — CHLORHEXIDINE GLUCONATE CLOTH 2 % EX PADS
6.0000 | MEDICATED_PAD | Freq: Every day | CUTANEOUS | Status: DC
  Administered 2018-06-08: 05:00:00 6 via TOPICAL

## 2018-06-06 MED ORDER — INSULIN REGULAR(HUMAN) IN NACL 100-0.9 UT/100ML-% IV SOLN
INTRAVENOUS | Status: DC
  Administered 2018-06-06: 3.7 [IU]/h via INTRAVENOUS
  Administered 2018-06-06: 4.4 [IU]/h via INTRAVENOUS
  Filled 2018-06-06: qty 100

## 2018-06-06 MED ORDER — METOPROLOL TARTRATE 5 MG/5ML IV SOLN
2.5000 mg | Freq: Once | INTRAVENOUS | Status: AC
  Administered 2018-06-06: 2.5 mg via INTRAVENOUS

## 2018-06-06 MED ORDER — VANCOMYCIN HCL 10 G IV SOLR
1500.0000 mg | Freq: Once | INTRAVENOUS | Status: AC
  Administered 2018-06-06: 1500 mg via INTRAVENOUS
  Filled 2018-06-06: qty 1500

## 2018-06-06 MED ORDER — DEXTROSE IN LACTATED RINGERS 5 % IV SOLN
INTRAVENOUS | Status: DC
  Administered 2018-06-06: 07:00:00 via INTRAVENOUS
  Administered 2018-06-06: 125 mL/h via INTRAVENOUS

## 2018-06-06 MED ORDER — INSULIN REGULAR(HUMAN) IN NACL 100-0.9 UT/100ML-% IV SOLN
INTRAVENOUS | Status: DC
  Administered 2018-06-06: 2.3 [IU]/h via INTRAVENOUS
  Filled 2018-06-06: qty 100

## 2018-06-06 MED ORDER — LACTATED RINGERS IV SOLN: INTRAVENOUS | Status: DC

## 2018-06-06 MED ORDER — ENOXAPARIN SODIUM 40 MG/0.4ML ~~LOC~~ SOLN
40.0000 mg | SUBCUTANEOUS | Status: DC
  Administered 2018-06-06: 40 mg via SUBCUTANEOUS
  Filled 2018-06-06: qty 0.4

## 2018-06-06 MED ORDER — POTASSIUM CHLORIDE CRYS ER 20 MEQ PO TBCR
40.0000 meq | EXTENDED_RELEASE_TABLET | Freq: Once | ORAL | Status: AC
  Administered 2018-06-06: 40 meq via ORAL
  Filled 2018-06-06: qty 2

## 2018-06-06 MED ORDER — SODIUM CHLORIDE 0.9 % IV SOLN
INTRAVENOUS | Status: DC
  Administered 2018-06-06: 125 mL/h via INTRAVENOUS

## 2018-06-06 MED ORDER — SODIUM CHLORIDE 0.9 % IV SOLN
INTRAVENOUS | Status: AC
  Administered 2018-06-06: 999 mL/h via INTRAVENOUS
  Administered 2018-06-06: 05:00:00 via INTRAVENOUS

## 2018-06-06 MED ORDER — METOPROLOL TARTRATE 5 MG/5ML IV SOLN: 5.0000 mg | Freq: Four times a day (QID) | INTRAVENOUS | Status: DC | PRN

## 2018-06-06 MED ORDER — SODIUM BICARBONATE 8.4 % IV SOLN
INTRAVENOUS | Status: DC
  Administered 2018-06-06 – 2018-06-07 (×2): via INTRAVENOUS
  Filled 2018-06-06 (×3): qty 850

## 2018-06-06 MED ORDER — POTASSIUM CHLORIDE 10 MEQ/100ML IV SOLN: 10.0000 meq | INTRAVENOUS | Status: DC

## 2018-06-06 MED ORDER — METOPROLOL TARTRATE 5 MG/5ML IV SOLN
2.5000 mg | Freq: Once | INTRAVENOUS | Status: AC
  Administered 2018-06-06: 2.5 mg via INTRAVENOUS
  Filled 2018-06-06: qty 5

## 2018-06-06 MED ORDER — SODIUM CHLORIDE 0.9 % IV SOLN
2.0000 g | Freq: Two times a day (BID) | INTRAVENOUS | Status: DC
  Administered 2018-06-06 – 2018-06-07 (×4): 2 g via INTRAVENOUS
  Filled 2018-06-06 (×6): qty 2

## 2018-06-06 MED ORDER — METRONIDAZOLE IN NACL 5-0.79 MG/ML-% IV SOLN
500.0000 mg | Freq: Three times a day (TID) | INTRAVENOUS | Status: DC
  Administered 2018-06-06 – 2018-06-07 (×3): 500 mg via INTRAVENOUS
  Filled 2018-06-06 (×6): qty 100

## 2018-06-06 MED ORDER — IOHEXOL 350 MG/ML SOLN
75.0000 mL | Freq: Once | INTRAVENOUS | Status: AC | PRN
  Administered 2018-06-06: 75 mL via INTRAVENOUS

## 2018-06-06 MED ORDER — DEXTROSE-NACL 5-0.45 % IV SOLN: INTRAVENOUS | Status: DC

## 2018-06-06 MED ORDER — SODIUM BICARBONATE 8.4 % IV SOLN
INTRAVENOUS | Status: DC
  Administered 2018-06-06: 11:00:00 via INTRAVENOUS
  Filled 2018-06-06 (×5): qty 150

## 2018-06-06 MED ORDER — SODIUM CHLORIDE 0.9 % IV SOLN
INTRAVENOUS | Status: DC | PRN
  Administered 2018-06-06 – 2018-06-07 (×2): 250 mL via INTRAVENOUS
  Administered 2018-06-08: 08:00:00 500 mL via INTRAVENOUS

## 2018-06-06 MED ORDER — METOPROLOL TARTRATE 5 MG/5ML IV SOLN
INTRAVENOUS | Status: AC
  Filled 2018-06-06: qty 5

## 2018-06-06 MED ORDER — VANCOMYCIN HCL IN DEXTROSE 750-5 MG/150ML-% IV SOLN
750.0000 mg | INTRAVENOUS | Status: DC
  Administered 2018-06-07: 750 mg via INTRAVENOUS
  Filled 2018-06-06: qty 150

## 2018-06-06 MED ORDER — SODIUM CHLORIDE 0.9 % IV SOLN: 2.0000 g | Freq: Once | INTRAVENOUS | Status: DC

## 2018-06-06 MED ORDER — STERILE WATER FOR INJECTION IV SOLN
INTRAVENOUS | Status: DC
  Administered 2018-06-06: 03:00:00 via INTRAVENOUS
  Filled 2018-06-06 (×5): qty 850

## 2018-06-06 MED ORDER — DEXTROSE-NACL 5-0.45 % IV SOLN
INTRAVENOUS | Status: DC
  Administered 2018-06-06: 06:00:00 via INTRAVENOUS

## 2018-06-06 NOTE — Progress Notes (Signed)
CODE SEPSIS - PHARMACY COMMUNICATION  **Broad Spectrum Antibiotics should be administered within 1 hour of Sepsis diagnosis**  Time Code Sepsis Called/Page Received: 0136  Antibiotics Ordered: vanc/cefepime/flagyl  Time of 1st antibiotic administration: 0216  Additional action taken by pharmacy: Secure chatted MD to switch to cefepime considering PCN allx not severe--MD approved  If necessary, Name of Provider/Nurse Contacted:     Thomasene Ripple ,PharmD Clinical Pharmacist  06/06/2018  2:48 AM

## 2018-06-06 NOTE — ED Notes (Signed)
Son Sosie Lade [phone]

## 2018-06-06 NOTE — Progress Notes (Signed)
Pt received From ER to Rm 12 . A & O x 3. Tachycardic & tachypenic  @ this time. Insulin gtt infusing . 0355 Critical lab , Lactic acid 2.7; 2.5 reported to NP . Will continue to monitor pt closely.

## 2018-06-06 NOTE — Progress Notes (Signed)
Pharmacy Electrolyte Monitoring Consult:  Pharmacy consulted to assist in monitoring and replacing electrolytes in this 53 y.o. female admitted on 06/05/2018 with Shortness of Breath   Labs:  Sodium (mmol/L)  Date Value  06/06/2018 144  04/04/2018 138   Potassium (mmol/L)  Date Value  06/06/2018 2.9 (L)   Phosphorus (mg/dL)  Date Value  93/73/4287 3.5   Calcium (mg/dL)  Date Value  68/12/5724 7.7 (L)   Albumin (g/dL)  Date Value  20/35/5974 3.9    Assessment/Plan: Patient continued on insulin drip. Current orders for Sodium bicarb/D5 with potassium at 170mL/hr.   Potassium PO x 2 doses.   Continue to monitor BMPs Q4hr while on insulin infusion.   Pharmacy will continue to monitor and adjust per consult.    Anesha Hackert L 06/06/2018 5:35 PM

## 2018-06-06 NOTE — Progress Notes (Signed)
eLink Physician-Brief Progress Note Patient Name: Lori Carr DOB: 1965-09-26 MRN: [id number]   Date of Service  06/06/2018  HPI/Events of Note  Pt admitted with severe DKA and is being treated per protocol for DKA.  eICU Interventions  New patient evaluation completed.        [name] 06/06/2018, 2:43 AM

## 2018-06-06 NOTE — Progress Notes (Signed)
[facility] - [place] at [facility]   PATIENT NAME: Lori Carr    MR#:  [id number]  DATE OF BIRTH:  1965-03-22  SUBJECTIVE:  CHIEF COMPLAINT: Patient is feeling better but tachycardic on insulin drip Very thirsty REVIEW OF SYSTEMS:  CONSTITUTIONAL: No fever, fatigue or weakness.  EYES: No blurred or double vision.  EARS, NOSE, AND THROAT: No tinnitus or ear pain.  RESPIRATORY: No cough, shortness of breath, wheezing or hemoptysis.  CARDIOVASCULAR: No chest pain, orthopnea, edema.  GASTROINTESTINAL: No nausea, vomiting, diarrhea or abdominal pain.  GENITOURINARY: No dysuria, hematuria.  ENDOCRINE: No polyuria, nocturia,  HEMATOLOGY: No anemia, easy bruising or bleeding SKIN: No rash or lesion. MUSCULOSKELETAL: No joint pain or arthritis.   NEUROLOGIC: No tingling, numbness, weakness.  PSYCHIATRY: No anxiety or depression.   DRUG ALLERGIES:   Allergies  Allergen Reactions  . Amoxicillin-Pot Clavulanate Itching  . Vicodin [Hydrocodone-Acetaminophen] Nausea Only    VITALS:  Blood pressure (!) 142/73, pulse (!) 123, temperature 98.1 F (36.7 C), temperature source Oral, resp. rate (!) 23, height 5\' 2"  (1.575 m), weight 78.7 kg, last menstrual period 06/04/2018, SpO2 99 %.  PHYSICAL EXAMINATION:  GENERAL:  53 y.o.-year-old patient lying in the bed with no acute distress.  EYES: Pupils equal, round, reactive to light and accommodation. No scleral icterus. Extraocular muscles intact.  HEENT: Head atraumatic, normocephalic. Oropharynx and nasopharynx clear.  NECK:  Supple, no jugular venous distention. No thyroid enlargement, no tenderness.  LUNGS: Normal breath sounds bilaterally, no wheezing, rales,rhonchi or crepitation. No use of accessory muscles of respiration.  CARDIOVASCULAR: S1, S2 normal. No murmurs, rubs, or gallops.  ABDOMEN: Soft, nontender, nondistended. Bowel sounds present.   EXTREMITIES: No pedal edema, cyanosis, or clubbing.   NEUROLOGIC: Cranial nerves II through XII are intact. Muscle strength-global weakness sensation intact. Gait not checked.  PSYCHIATRIC: The patient is alert and oriented x 3.  SKIN: No obvious rash, lesion, or ulcer.    LABORATORY PANEL:   CBC Recent Labs  Lab 06/06/18 0412  WBC 32.7*  HGB 11.8*  HCT 40.3  PLT 604*   ------------------------------------------------------------------------------------------------------------------  Chemistries  Recent Labs  Lab 06/06/18 0814  NA 147*  K 3.5  CL 125*  CO2 <7*  GLUCOSE 223*  BUN 34*  CREATININE 1.21*  CALCIUM 7.9*   ------------------------------------------------------------------------------------------------------------------  Cardiac Enzymes No results for input(s): TROPONINI in the last 168 hours. ------------------------------------------------------------------------------------------------------------------  RADIOLOGY:  Ct Abdomen Pelvis Wo Contrast  Result Date: 06/06/2018 CLINICAL DATA:  Sepsis. Inpatient. Severe diabetic ketoacidosis. Urinalysis concerning for UTI. EXAM: CT ABDOMEN AND PELVIS WITHOUT CONTRAST TECHNIQUE: Multidetector CT imaging of the abdomen and pelvis was performed following the standard protocol without IV contrast. COMPARISON:  Chest CT angiogram from earlier today. FINDINGS: Lower chest: There are patchy ground-glass opacities throughout the periphery of both lung bases. There is evidence of air trapping within a pulmonary sequestration at the posteromedial right lung base with probable arterial supply arising from celiac trunk as seen on chest CT angiogram from earlier today. These findings were obscured by severe respiratory motion artifact on the chest CT angiogram performed earlier today. Hepatobiliary: Normal liver size. No liver mass. Normal gallbladder with no radiopaque cholelithiasis. No biliary ductal dilatation. Pancreas: Normal, with no mass or duct dilation. Spleen: Normal size. No  mass. Adrenals/Urinary Tract: Normal adrenals. There are striated contrast nephrograms in both kidneys on this noncontrast study. Excreted contrast is seen in the renal collecting systems, ureters and bladder. No hydronephrosis. No contour deforming renal  masses. Normal bladder. Stomach/Bowel: Normal non-distended stomach. Normal caliber small bowel with no small bowel wall thickening. Normal appendix. Normal large bowel with no diverticulosis, large bowel wall thickening or pericolonic fat stranding. Vascular/Lymphatic: Normal caliber abdominal aorta. No pathologically enlarged lymph nodes in the abdomen or pelvis. Reproductive: Mildly enlarged globular left deviated anteverted uterus. No adnexal masses. Other: No pneumoperitoneum, ascites or focal fluid collection. Musculoskeletal: No aggressive appearing focal osseous lesions. Moderate thoracolumbar spondylosis, most prominent at L5-S1. IMPRESSION: 1. Patchy ground-glass opacity throughout the periphery of both lung bases (which was obscured by motion artifact on the chest CT angiogram study performed earlier today). This finding is presumably inflammatory, with atypical infection including viral pneumonia on the differential. Note is made of a negative result on SARS coronavirus 2 swab from earlier today. 2. 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 3. Serrated contrast nephrograms in both kidneys on this noncontrast study, which may indicate a degree of acute renal failure. No hydronephrosis. Recommend serial serum creatinine correlation. 4. Otherwise no acute abnormality in the abdomen or pelvis. No fluid collections. Electronically Signed   By: Delbert PhenixJason A Poff M.D.   On: 06/06/2018 12:27   Ct Angio Chest Pe W And/or Wo Contrast  Result Date: 06/06/2018 CLINICAL DATA:  Chest pain EXAM: CT ANGIOGRAPHY CHEST WITH CONTRAST TECHNIQUE: Multidetector CT imaging of the chest was performed using the standard protocol  during bolus administration of intravenous contrast. Multiplanar CT image reconstructions and MIPs were obtained to evaluate the vascular anatomy. CONTRAST:  75mL OMNIPAQUE IOHEXOL 350 MG/ML SOLN COMPARISON:  Chest x-ray 06/05/2018 FINDINGS: Cardiovascular: Respiratory motion obscures the vessels in the lower lobes. No visualized large or central pulmonary emboli. Heart is normal size. Aorta is normal caliber. Mediastinum/Nodes: No mediastinal, hilar, or axillary adenopathy. Lungs/Pleura: Lungs are clear. No focal airspace opacities or suspicious nodules. No effusions. Calcified granuloma in the left upper lobe. Upper Abdomen: Imaging into the upper abdomen shows no acute findings. Musculoskeletal: Chest wall soft tissues are unremarkable. No acute bony abnormality. Review of the MIP images confirms the above findings. IMPRESSION: No evidence of large or central pulmonary emboli. The lower lobe pulmonary arterial branches are obscured by respiratory motion. Old granulomatous disease. No visualized acute cardiopulmonary disease. Electronically Signed   By: Charlett NoseKevin  Dover M.D.   On: 06/06/2018 03:43   [place] Venous Img Lower Bilateral  Result Date: 06/06/2018 CLINICAL DATA:  Lower extremity edema, pain EXAM: BILATERAL LOWER EXTREMITY VENOUS DOPPLER ULTRASOUND TECHNIQUE: Gray-scale sonography with compression, as well as color and duplex ultrasound, were performed to evaluate the deep venous system from the level of the common femoral vein through the popliteal and proximal calf veins. COMPARISON:  None FINDINGS: Normal compressibility of the common femoral, superficial femoral, and popliteal veins, as well as the proximal calf veins. No filling defects to suggest DVT on grayscale or color Doppler imaging. Doppler waveforms show normal direction of venous flow, normal respiratory phasicity and response to augmentation. Visualized segments of the saphenous venous systems normal in caliber and compressibility. IMPRESSION:  No femoropopliteal and no calf DVT in the visualized calf veins. If clinical symptoms are inconsistent or if there are persistent or worsening symptoms, further imaging (possibly involving the iliac veins) may be warranted. Electronically Signed   By: Corlis Leak  Hassell M.D.   On: 06/06/2018 12:53   Dg Chest Port 1 View  Result Date: 06/05/2018 CLINICAL DATA:  Shortness of breath EXAM: PORTABLE CHEST 1 VIEW COMPARISON:  None. FINDINGS: Heart and  mediastinal contours are within normal limits. No focal opacities or effusions. No acute bony abnormality. IMPRESSION: No active disease. Electronically Signed   By: Charlett Nose M.D.   On: 06/05/2018 23:19    EKG:   Orders placed or performed during the hospital encounter of 06/05/18  . EKG 12-Lead  . EKG 12-Lead  . ED EKG  . ED EKG    ASSESSMENT AND PLAN:   #DKA-could be from the new medicine xigduo Insulin drip, aggressive hydration with IV fluids Serial BMPs Stop the new medicine COVID test negative  #Acute kidney injury secondary to dehydration from DKA Aggressive hydration with IV fluids  #Chronic vaginal bleeding Patient has been following with gynecology continue follow-up and monitor's hemoglobin  #Leukocytosis-etiology unclear Blood cultures are negative so far urine culture pending.  MRSA PCR positive Chest x-ray negative On empiric antibiotics cefepime and vancomycin  #Sinus tachycardia-could be from dehydration Hydrate with IV fluids   #Obesity Scheduled to get gastric sleeve this Friday.  Patient is canceling the procedure for now    All the records are reviewed and case discussed with Care Management/Social Workerr. Management plans discussed with the patient, she is  in agreement.  CODE STATUS: fc  TOTAL TIME TAKING CARE OF THIS PATIENT: 36 minutes.   POSSIBLE D/C IN 2  DAYS, DEPENDING ON CLINICAL CONDITION.  Note: This dictation was prepared with Dragon dictation along with smaller phrase technology. Any  transcriptional errors that result from this process are unintentional.   Ramonita Lab M.D on 06/06/2018 at 1:14 PM  Between 7am to 6pm - Pager - [phone] After 6pm go to [email or web address] - password [email or web address] [facility]  [facility]  Office  [phone]  CC: Primary care physician; Gilles Chiquito, MD

## 2018-06-06 NOTE — ED Notes (Signed)
Call to son, update given.

## 2018-06-06 NOTE — H&P (Signed)
[facility] - [place] at [facility]   PATIENT NAME: Lori Carr    MR#:  [id number]  DATE OF BIRTH:  August 17, 1965  DATE OF ADMISSION:  06/05/2018  PRIMARY CARE PHYSICIAN: Gilles Chiquito, MD   REQUESTING/REFERRING PHYSICIAN: Manson Passey, MD  CHIEF COMPLAINT:   Chief Complaint  Patient presents with  . Shortness of Breath    HISTORY OF PRESENT ILLNESS:  Lori Carr  is a 53 y.o. female who presents with chief complaint as above.  Patient presents the ED for complaint of shortness of breath.  She is found to be tachypneic on initial evaluation.  Lab work shows that she is in DKA.  Patient states that she has had diabetes since she was 53 years old but never before had DKA.  She denies any other strong infectious etiology leading up to this.  She presented with clue small breathing and has significant metabolic abnormalities.  Hospitalist called for admission  PAST MEDICAL HISTORY:   Past Medical History:  Diagnosis Date  . Diabetes mellitus   . Obesity      PAST SURGICAL HISTORY:   Past Surgical History:  Procedure Laterality Date  . CESAREAN SECTION     x3     SOCIAL HISTORY:   Social History   Tobacco Use  . Smoking status: Never Smoker  . Smokeless tobacco: Never Used  Substance Use Topics  . Alcohol use: No     FAMILY HISTORY:   Family History  Problem Relation Age of Onset  . Cancer Mother 49       deceased age 41 stage IV ovarian cancer  . Breast cancer Maternal Aunt 50  . Breast cancer Paternal Grandmother 26  . Breast cancer Paternal Aunt 49  . Stroke Brother      DRUG ALLERGIES:   Allergies  Allergen Reactions  . Amoxicillin-Pot Clavulanate Itching  . Vicodin [Hydrocodone-Acetaminophen] Nausea Only    MEDICATIONS AT HOME:   Prior to Admission medications   Medication Sig Start Date End Date Taking? Authorizing Provider  BAYER CONTOUR TEST test strip  08/20/10   [provider]  Insulin Glargine (LANTUS  [place]) Inject into the skin.      [provider]  megestrol (MEGACE) 40 MG tablet Take two tablets three times daily for 3 days, then two tablets twice daily for 3 days, then one tablet twice daily 04/07/18   Reva Bores, MD  meloxicam (MOBIC) 15 MG tablet Take 1 tablet (15 mg total) by mouth daily. 08/07/16   Cuthriell, Delorise Royals, PA-C  metFORMIN (GLUCOPHAGE) 1000 MG tablet Take 1 tablet by mouth daily.    [provider]  NOVOLOG 100 UNIT/ML injection  05/25/13   [provider]  pantoprazole (PROTONIX) 40 MG tablet Take 1 tablet by mouth as needed. 06/21/17 12/18/17  [provider]    REVIEW OF SYSTEMS:  Review of Systems  Constitutional: Positive for malaise/fatigue. Negative for chills, fever and weight loss.  HENT: Negative for ear pain, hearing loss and tinnitus.   Eyes: Negative for blurred vision, double vision, pain and redness.  Respiratory: Positive for shortness of breath. Negative for cough and hemoptysis.   Cardiovascular: Negative for chest pain, palpitations, orthopnea and leg swelling.  Gastrointestinal: Negative for abdominal pain, constipation, diarrhea, nausea and vomiting.  Genitourinary: Negative for dysuria, frequency and hematuria.  Musculoskeletal: Negative for back pain, joint pain and neck pain.  Skin:       No acne, rash, or lesions  Neurological: Negative for dizziness, tremors, focal weakness and weakness.  Endo/Heme/Allergies: Negative for polydipsia. Does not bruise/bleed easily.  Psychiatric/Behavioral: Negative for depression. The patient is not nervous/anxious and does not have insomnia.      VITAL SIGNS:   Vitals:   06/05/18 2225 06/05/18 2229 06/06/18 0146  BP:  (!) 168/105 (!) 198/91  Pulse:  (!) 134 (!) 130  Resp:  (!) 28 (!) 36  Temp:  97.8 F (36.6 C) 97.8 F (36.6 C)  TempSrc:  Oral   SpO2:  98% 97%  Weight: 83.9 kg    Height: 5\' 1"  (1.549 m)     Wt Readings from Last 3 Encounters:  06/05/18 83.9 kg   08/30/17 86.6 kg  08/16/17 88.2 kg    PHYSICAL EXAMINATION:  Physical Exam  Vitals reviewed. Constitutional: She is oriented to person, place, and time. She appears well-developed and well-nourished. She appears distressed (Mild).  HENT:  Head: Normocephalic and atraumatic.  Dry mucous membranes  Eyes: Pupils are equal, round, and reactive to light. Conjunctivae and EOM are normal. No scleral icterus.  Neck: Normal range of motion. Neck supple. No JVD present. No thyromegaly present.  Cardiovascular: Regular rhythm and intact distal pulses. Exam reveals no gallop and no friction rub.  No murmur heard. Tachycardic  Respiratory: Breath sounds normal. No respiratory distress. She has no wheezes. She has no rales.  Tachypneic  GI: Soft. Bowel sounds are normal. She exhibits no distension. There is no abdominal tenderness.  Musculoskeletal: Normal range of motion.        General: No edema.     Comments: No arthritis, no gout  Lymphadenopathy:    She has no cervical adenopathy.  Neurological: She is alert and oriented to person, place, and time. No cranial nerve deficit.  No dysarthria, no aphasia  Skin: Skin is warm and dry. No rash noted. No erythema.  Psychiatric: She has a normal mood and affect. Her behavior is normal. Judgment and thought content normal.    LABORATORY PANEL:   CBC Recent Labs  Lab 06/05/18 2227  WBC 23.4*  HGB 12.8  HCT 43.0  PLT 662*   ------------------------------------------------------------------------------------------------------------------  Chemistries  Recent Labs  Lab 06/05/18 2227  NA 140  K 5.0  CL 115*  CO2 <7*  GLUCOSE 298*  BUN 33*  CREATININE 1.25*  CALCIUM 9.1   ------------------------------------------------------------------------------------------------------------------  Cardiac Enzymes No results for input(s): TROPONINI in the last 168  hours. ------------------------------------------------------------------------------------------------------------------  RADIOLOGY:  Dg Chest Port 1 View  Result Date: 06/05/2018 CLINICAL DATA:  Shortness of breath EXAM: PORTABLE CHEST 1 VIEW COMPARISON:  None. FINDINGS: Heart and mediastinal contours are within normal limits. No focal opacities or effusions. No acute bony abnormality. IMPRESSION: No active disease. Electronically Signed   By: Charlett Nose M.D.   On: 06/05/2018 23:19    EKG:   Orders placed or performed during the hospital encounter of 06/05/18  . EKG 12-Lead  . EKG 12-Lead  . ED EKG  . ED EKG  . ED EKG 12-Lead  . ED EKG 12-Lead    IMPRESSION AND PLAN:  Principal Problem:   DKA (diabetic ketoacidoses) (HCC) -patient states she has never had DKA before.  It is unclear what may be driving this.  With strong leukocytosis she was given IV antibiotics in the ED.  Normal coronavirus testing pending.  Insulin drip started.  Will admit her to ICU per DKA admission order set with insulin drip, bicarb, electrolyte replacement, intensivist consult. Active Problems:  AKI (acute kidney injury) (HCC) -likely related to her dehydration from above, aggressive IV fluids, avoid nephrotoxins and monitor  Chart review performed and case discussed with ED provider. Labs, imaging and/or ECG reviewed by provider and discussed with patient/family. Management plans discussed with the patient and/or family.  COVID-19 status: Pending  DVT PROPHYLAXIS: SubQ lovenox   GI PROPHYLAXIS:  None  ADMISSION STATUS: Inpatient     CODE STATUS: Full Advance Directive Documentation     Most Recent Value  Type of Advance Directive  Living will, Healthcare Power of Attorney  Pre-existing out of facility DNR order (yellow form or pink MOST form)  -  "MOST" Form in Place?  -      TOTAL CRITICAL CARE TIME TAKING CARE OF THIS PATIENT: 50 minutes.   This patient was evaluated in the context of  the global COVID-19 pandemic, which necessitated consideration that the patient might be at risk for infection with the SARS-CoV-2 virus that causes COVID-19. Institutional protocols and algorithms that pertain to the evaluation of patients at risk for COVID-19 are in a state of rapid change based on information released by regulatory bodies including the CDC and federal and state organizations. These policies and algorithms were followed to the best of this provider's knowledge to date during the patient's care at this facility.  Chace Bisch F Neena Beecham 06/06/2018, 1:59 AM  [facility]  Office  [phone]  CC: Primary care physician; Gilles Chiquitoabinowitz, Joseph H, MD  Note:  This document was prepared using Dragon voice recognition software and may include unintentional dictation errors.

## 2018-06-06 NOTE — Progress Notes (Signed)
Inpatient Diabetes Program Recommendations  AACE/ADA: New Consensus Statement on Inpatient Glycemic Control (2015)  Target Ranges:  Prepandial:   less than 140 mg/dL      Peak postprandial:   less than 180 mg/dL (1-2 hours)      Critically ill patients:  140 - 180 mg/dL    Review of Glycemic Control  Diabetes history: DM 2, Managed by PCP  Outpatient Diabetes medications:  Lantus 28 units Daily Novolog up to 6 units tid with meals but titrates depending on what she eats Metformin 1000 mg Daily Xigduo XR 10-1,000 mg TBph every morning (SGLT2 and metformin combination) Verified with patient had been on it for a few months  Current orders for Inpatient glycemic control:  IV insulin gtt DKA protocol.  Inpatient Diabetes Program Recommendations:    Called patient over the phone to get more information about medications and glucose control at home. Patient is still having kussmaul respirations.  0800 Last BMET CO2 <7, anion gap not calculated, glucose 233 mg/dl.  Glucose was 200's when admitted. In severe DKA. Patient was taking Xigduo XR at home (dapagliflozin an SGLT2 inhibitor and Metformin combination). SGLT 2 class of medications have been known to cause DKA in some patient's with DM.   Patient was also scheduled for Gastric sleeve surgery on May 8th and was on protein shakes for up to 5 days prior to surgery.  Patient reports not changing anything recently, not being sick, taking medications as prescribed. Patient checks her glucose 3x/day and trends are in the upper 100's. Her last A1c was around 8%.  Called Dr. Amado Coe to discuss plan of care of patient. Dr. Amado Coe to round on patient today.  Patients with DKA related to SGLT2 inhibitors take much longer to clear DKA and will need glucose/dextrose in order to clear DKA as well.   When patient feels well enough and not having labored breathing patient can eat while on glucostabilizer on DKA protocol, RN would bolus for meals via  insulin gtt.  Transition when CO2 is 20, Anion Gap Corrected and Glucose WNL per protocol.  Thanks,  Christena Deem RN, MSN, BC-ADM Inpatient Diabetes Coordinator Team Pager [phone] (8a-5p)

## 2018-06-06 NOTE — Consult Note (Signed)
Name: Lori Carr MRN: [id number] DOB: 07/06/1965    ADMISSION DATE:  06/05/2018 CONSULTATION DATE:  06/06/2018  REFERRING MD :  Dr. Anne Hahn  CHIEF COMPLAINT:  Shortness of Breath  BRIEF PATIENT DESCRIPTION:  53 year old female with past medical history notable for diabetes and obesity, who is admitted for severe DKA and severe Anion Gap Metabolic Acidosis requiring insulin drip and Bicarb drip.  UA is concerning for UTI.  Patient with elevated fibrin derivatives (830), CTA chest negative for PE, venous ultrasound of bilateral lower extremities is pending.  SIGNIFICANT EVENTS  06/06/18>> Admission to [facility] ICU  STUDIES:  CTA Chest 5/6>> No evidence of large or central pulmonary emboli. The lower lobe pulmonary arterial branches are obscured by respiratory motion.Old granulomatous disease.No visualized acute cardiopulmonary disease. Venous US Bilateral LE 5/6>>  CULTURES: Blood x2 5/5>> Urine 5/6>> COVID-19 5/5>> NEGATIVE  ANTIBIOTICS: Cefepime 5/5>> Flagyl 5/5>>5/6 Vancomycin 5/5>>5/6  HISTORY OF PRESENT ILLNESS:   Lori Carr is a 53 year old female with a past medical history notable for diabetes and obesity who presents to [facility] ED on 06/05/2018 with complaints of a 3-day history of progressive shortness of breath and bilateral rib pain.  She denies any nausea, vomiting, or diarrhea, and she denies any lower extremity pain, edema, or redness.  Initial work-up in the ED reveals WBC 23.4, BNP 39, fibrin derivatives 830, glucose 298, bicarb less than 7, creatinine 1.25.  Venous blood gas with pH 6.95, bicarb 3.1.  Urinalysis is positive for ketones and with trace leukocytes. Chest x-ray is negative, her COVID-19 PCR is negative.  CTA chest is negative for PE and negative for acte cardiopulmonary disease.  In the ED she was given 2L Normal Saline boluses, IV insulin drip, and 2 amps of sodium bicarb.  She is being admitted to [facility] ICU for treatment of severe DKA and severe anion gap metabolic  acidosis requiring insulin drip and bicarb drip.  PCCM is consulted for further management.  PAST MEDICAL HISTORY :   has a past medical history of Diabetes mellitus and Obesity.  has a past surgical history that includes Cesarean section. Prior to Admission medications   Medication Sig Start Date End Date Taking? Authorizing Provider  BAYER CONTOUR TEST test strip  08/20/10   [provider]  Insulin Glargine (LANTUS [place]) Inject into the skin.      [provider]  megestrol (MEGACE) 40 MG tablet Take two tablets three times daily for 3 days, then two tablets twice daily for 3 days, then one tablet twice daily 04/07/18   Reva Bores, MD  meloxicam (MOBIC) 15 MG tablet Take 1 tablet (15 mg total) by mouth daily. 08/07/16   Cuthriell, Delorise Royals, PA-C  metFORMIN (GLUCOPHAGE) 1000 MG tablet Take 1 tablet by mouth daily.    [provider]  NOVOLOG 100 UNIT/ML injection  05/25/13   [provider]  pantoprazole (PROTONIX) 40 MG tablet Take 1 tablet by mouth as needed. 06/21/17 12/18/17  [provider]   Allergies  Allergen Reactions  . Amoxicillin-Pot Clavulanate Itching  . Vicodin [Hydrocodone-Acetaminophen] Nausea Only    FAMILY HISTORY:  family history includes Breast cancer (age of onset: 34) in her maternal aunt; Breast cancer (age of onset: 86) in her paternal grandmother; Breast cancer (age of onset: 85) in her paternal aunt; Cancer (age of onset: 15) in her mother; Stroke in her brother. SOCIAL HISTORY:  reports that she has never smoked. She has never used smokeless tobacco. She reports that  she does not drink alcohol or use drugs.   COVID-19 DISASTER DECLARATION:  FULL CONTACT PHYSICAL EXAMINATION WAS NOT POSSIBLE DUE TO TREATMENT OF COVID-19 AND  CONSERVATION OF PERSONAL PROTECTIVE EQUIPMENT, LIMITED EXAM FINDINGS INCLUDE-  Patient assessed or the symptoms described in the history of present illness.  In the context of the Global  COVID-19 pandemic, which necessitated consideration that the patient might be at risk for infection with the SARS-CoV-2 virus that causes COVID-19, Institutional protocols and algorithms that pertain to the evaluation of patients at risk for COVID-19 are in a state of rapid change based on information released by regulatory bodies including the CDC and federal and state organizations. These policies and algorithms were followed during the patient's care while in hospital.  REVIEW OF SYSTEMS:  Positives in BOLD Constitutional: Negative for fever, chills, weight loss, +malaise/fatigue and diaphoresis.  HENT: Negative for hearing loss, ear pain, nosebleeds, congestion, sore throat, neck pain, tinnitus and ear discharge.   Eyes: Negative for blurred vision, double vision, photophobia, pain, discharge and redness.  Respiratory: Negative for cough, hemoptysis, sputum production, +shortness of breath, wheezing and stridor.   Cardiovascular: Negative for chest pain, palpitations, orthopnea, claudication, leg swelling and PND.  Gastrointestinal: Negative for heartburn, nausea, vomiting, abdominal pain, diarrhea, constipation, blood in stool and melena.  Genitourinary: Negative for dysuria, urgency, frequency, hematuria and flank pain.  Musculoskeletal: Negative for myalgias, back pain, joint pain and falls.  Skin: Negative for itching and rash.  Neurological: Negative for dizziness, tingling, tremors, sensory change, speech change, focal weakness, seizures, loss of consciousness, weakness and headaches.  Endo/Heme/Allergies: Negative for environmental allergies and polydipsia. Does not bruise/bleed easily.  SUBJECTIVE:  Patient reports shortness of breath, and that he she needs to pee Denies chest pain, abdominal pain, nausea, vomiting, diarrhea, fever/chills Wants to drink something  VITAL SIGNS: Temp:  [97.8 F (36.6 C)] 97.8 F (36.6 C) (05/06 0146) Pulse Rate:  [130-134] 130 (05/06 0146) Resp:   [28-36] 36 (05/06 0146) BP: (168-198)/(91-105) 198/91 (05/06 0146) SpO2:  [97 %-98 %] 97 % (05/06 0146) Weight:  [83.9 kg] 83.9 kg (05/05 2225)  PHYSICAL EXAMINATION: General: Acutely ill-appearing female, laying in bed, on room air, with Kussmaul's respirations Neuro: Awake, alert and oriented x3, follows commands, no focal deficits, speech clear HEENT: Atraumatic, normocephalic, neck supple, no JVD, dry mucous membranes Cardiovascular: Tachycardia, regular rhythm, no murmurs rubs or gallops Lungs: Clear to auscultation bilaterally, tachypnea, even, Kussmaul's respirations Abdomen: Soft, nontender, nondistended, no guarding or rebound tenderness, bowel sounds positive x4 Musculoskeletal: No deformities, normal bulk and tone Skin: Warm and dry, no obvious rashes lesions or ulcerations  Recent Labs  Lab 06/05/18 2227  NA 140  K 5.0  CL 115*  CO2 <7*  BUN 33*  CREATININE 1.25*  GLUCOSE 298*   Recent Labs  Lab 06/05/18 2227  HGB 12.8  HCT 43.0  WBC 23.4*  PLT 662*   Dg Chest Port 1 View  Result Date: 06/05/2018 CLINICAL DATA:  Shortness of breath EXAM: PORTABLE CHEST 1 VIEW COMPARISON:  None. FINDINGS: Heart and mediastinal contours are within normal limits. No focal opacities or effusions. No acute bony abnormality. IMPRESSION: No active disease. Electronically Signed   By: Charlett NoseKevin  Dover M.D.   On: 06/05/2018 23:19    ASSESSMENT / PLAN:  Severe DKA -Follow DKA protocol -Aggressive IVF; received 2 L normal saline bolus in ED -IV Insulin drip -Follow BMP q4h  -Once anion gap closed, can convert to Long-acting insulin and SSI -Consult  Diabetes Coordinator, appreciate input  AKI Severe Anion Gap Metabolic Acidosis in setting of DKA and lactic acidosis -Monitor I&O's / urinary output -Follow BMP q4h -Ensure adequate renal perfusion -Avoid nephrotoxic agents as able -Replace electrolytes as indicated -IV Fluids -Received 2 amps Bicarb in ED, will place on Bicarb drip  -Trend Lactic Acid  Leukocytosis in setting of UTI -Monitor fever curve -Trend WBC's and Procalcitonin -Follow cultures as above (COVID-19 NEGATIVE) -CXR negative -Urine culture pending -Continue cefepime, will discontinue Flagyl and Vancomycin   Elevated Fibrin Derivatives -CTA Chest negative for PE  -Obtain venous ultrasound of Lower Extremities -Trend FDP's   Disposition: ICU Goals of care: Full code VTE prophylaxis: Lovenox SQ Updates: Updated patient at bedside 06/06/18  Harlon Ditty, [facility] [place] [facility] Pager: [phone] Cell: [phone]  06/06/2018, 2:01 AM

## 2018-06-06 NOTE — ED Notes (Signed)
Report called to CCU. Pt is going to CT on the way to the unit.

## 2018-06-06 NOTE — Progress Notes (Signed)
Patient and this RN went to CT and Ultrasound.  Returned at this time without any issues.

## 2018-06-06 NOTE — Progress Notes (Signed)
Pharmacy Antibiotic Note  Lori Carr is a 53 y.o. female admitted on 06/05/2018 with sepsis.  Pharmacy has been consulted for vanc/cefepime dosing.  Plan: Patient received vanc 1.5g IV load, cefepime 2g, and flagyl 500 mg IV x 1 in ED  Vancomycin 750 mg IV Q 24 hrs. Goal AUC 400-550. Expected AUC: 449.5 SCr used: 1.15 Cssmin: 11.3  Will continue cefepime 2g IV q12h per CrCl 30 - 60 ml/min and flagyl 500 mg IV q8h. Will continue to monitor renal function and adjust doses as necessary.  Height: 5\' 2"  (157.5 cm) Weight: 173 lb 8 oz (78.7 kg) IBW/kg (Calculated) : 50.1  Temp (24hrs), Avg:97.8 F (36.6 C), Min:97.7 F (36.5 C), Max:97.8 F (36.6 C)  Recent Labs  Lab 06/05/18 2227 06/06/18 0213 06/06/18 0351 06/06/18 0412  WBC 23.4*  --   --  32.7*  CREATININE 1.25*  --   --  1.15*  LATICACIDVEN  --  2.7* 2.5*  --     Estimated Creatinine Clearance: 56.2 mL/min (A) (by C-G formula based on SCr of 1.15 mg/dL (H)).    Allergies  Allergen Reactions  . Amoxicillin-Pot Clavulanate Itching  . Vicodin [Hydrocodone-Acetaminophen] Nausea Only    Thank you for allowing pharmacy to be a part of this patient's care.  Thomasene Ripple, PharmD, BCPS Clinical Pharmacist 06/06/2018

## 2018-06-06 NOTE — ED Notes (Signed)
ED TO INPATIENT HANDOFF REPORT  ED Nurse Name and Phone #:  Kassie Keng [phone]  S Name/Age/Gender Lori Carr 53 y.o. female Room/Bed: ED08A/ED08A  Code Status   Code Status: Not on file  Home/SNF/Other Home Patient oriented to: self, place and situation Is this baseline? Yes   Triage Complete: Triage complete  Chief Complaint SOB  Triage Note No notes on file   Allergies Allergies  Allergen Reactions  . Amoxicillin-Pot Clavulanate Itching  . Vicodin [Hydrocodone-Acetaminophen] Nausea Only    Level of Care/Admitting Diagnosis ED Disposition    ED Disposition Condition Comment   Admit  [facility]ospital Area: [facility] [[id number]]  Level of Care: ICU [6]  Covid Evaluation: N/A  Diagnosis: DKA (diabetic ketoacidoses) [facility]) [[id number]]  Admitting Physician: Oralia Manis [[id number]]  Attending Physician: Anne Hahn, DAVID [phone]  Estimated length of stay: past midnight tomorrow  Certification:: I certify this patient will need inpatient services for at least 2 midnights  PT Class (Do Not Modify): Inpatient [101]  PT Acc Code (Do Not Modify): Private [1]       B Medical/Surgery History Past Medical History:  Diagnosis Date  . Diabetes mellitus   . Obesity    Past Surgical History:  Procedure Laterality Date  . CESAREAN SECTION     x3     A IV Location/Drains/Wounds Patient Lines/Drains/Airways Status   Active Line/Drains/Airways    Name:   Placement date:   Placement time:   Site:   Days:   Peripheral IV 06/05/18 Right Antecubital   06/05/18    2253    Antecubital   1   Peripheral IV 06/06/18 Left Antecubital   06/06/18    0140    Antecubital   less than 1   Peripheral IV 06/06/18 Left Hand   06/06/18    0220    Hand   less than 1          Intake/Output Last 24 hours No intake or output data in the 24 hours ending 06/06/18 0244  Labs/Imaging Results for orders placed or performed during the hospital encounter of 06/05/18 (from the past 48  hour(s))  CBC with Differential     Status: Abnormal   Collection Time: 06/05/18 10:27 PM  Result Value Ref Range   WBC 23.4 (H) 4.0 - 10.5 K/uL   RBC 5.04 3.87 - 5.11 MIL/uL   Hemoglobin 12.8 12.0 - 15.0 g/dL   HCT 29.5 62.1 - 30.8 %   MCV 85.3 80.0 - 100.0 fL   MCH 25.4 (L) 26.0 - 34.0 pg   MCHC 29.8 (L) 30.0 - 36.0 g/dL   RDW 65.7 84.6 - 96.2 %   Platelets 662 (H) 150 - 400 K/uL   nRBC 0.0 0.0 - 0.2 %   Neutrophils Relative % 86 %   Neutro Abs 20.3 (H) 1.7 - 7.7 K/uL   Lymphocytes Relative 6 %   Lymphs Abs 1.5 0.7 - 4.0 K/uL   Monocytes Relative 4 %   Monocytes Absolute 0.8 0.1 - 1.0 K/uL   Eosinophils Relative 0 %   Eosinophils Absolute 0.0 0.0 - 0.5 K/uL   Basophils Relative 1 %   Basophils Absolute 0.1 0.0 - 0.1 K/uL   Immature Granulocytes 3 %   Abs Immature Granulocytes 0.63 (H) 0.00 - 0.07 K/uL    Comment: Performed at [facility], [address], [place], [place] [address]  Basic metabolic panel     Status: Abnormal   Collection Time: 06/05/18 10:27 PM  Result Value Ref Range   Sodium 140 135 - 145 mmol/L   Potassium 5.0 3.5 - 5.1 mmol/L   Chloride 115 (H) 98 - 111 mmol/L   CO2 <7 (L) 22 - 32 mmol/L   Glucose, Bld 298 (H) 70 - 99 mg/dL   BUN 33 (H) 6 - 20 mg/dL   Creatinine, Ser 5.46 (H) 0.44 - 1.00 mg/dL   Calcium 9.1 8.9 - 56.8 mg/dL   GFR calc non Af Amer 50 (L) >60 mL/min   GFR calc Af Amer 58 (L) >60 mL/min   Anion gap NOT CALCULATED 5 - 15    Comment: Performed at [facility], [address] [place], [place] [address]  Brain natriuretic peptide     Status: None   Collection Time: 06/05/18 10:27 PM  Result Value Ref Range   B Natriuretic Peptide 39.0 0.0 - 100.0 pg/mL    Comment: Performed at [facility], [address] [place], [place] [address]  Fibrin derivatives D-Dimer     Status: Abnormal   Collection Time: 06/05/18 10:27 PM  Result Value Ref Range   Fibrin derivatives D-dimer ([facility]) 830.44 (H) 0.00 - 499.00  ng/mL (FEU)    Comment: (NOTE) <> Exclusion of Venous Thromboembolism (VTE) - OUTPATIENT ONLY   (Emergency Department or [place])   0-499 ng/ml (FEU): With a low to intermediate pretest probability                      for VTE this test result excludes the diagnosis                      of VTE.   >499 ng/ml (FEU) : VTE not excluded; additional work up for VTE is                      required. <> Testing on Inpatients and Evaluation of Disseminated Intravascular   Coagulation (DIC) Reference Range:   0-499 ng/ml (FEU) Performed at [facility], [address] [place], [place] [address]   SARS Coronavirus 2 (CEPHEID- Performed in [facility] hospital lab), Hosp Order     Status: None   Collection Time: 06/06/18 12:00 AM  Result Value Ref Range   SARS Coronavirus 2 NEGATIVE NEGATIVE    Comment: (NOTE) If result is NEGATIVE SARS-CoV-2 target nucleic acids are NOT DETECTED. The SARS-CoV-2 RNA is generally detectable in upper and lower  respiratory specimens during the acute phase of infection. The lowest  concentration of SARS-CoV-2 viral copies this assay can detect is 250  copies / mL. A negative result does not preclude SARS-CoV-2 infection  and should not be used as the sole basis for treatment or other  patient management decisions.  A negative result may occur with  improper specimen collection / handling, submission of specimen other  than nasopharyngeal swab, presence of viral mutation(s) within the  areas targeted by this assay, and inadequate number of viral copies  (<250 copies / mL). A negative result must be combined with clinical  observations, patient history, and epidemiological information. If result is POSITIVE SARS-CoV-2 target nucleic acids are DETECTED. The SARS-CoV-2 RNA is generally detectable in upper and lower  respiratory specimens dur ing the acute phase of infection.  Positive  results are indicative of active infection with SARS-CoV-2.  Clinical   correlation with patient history and other diagnostic information is  necessary to determine patient infection status.  Positive results do  not rule out bacterial infection or co-infection with other viruses. If result is PRESUMPTIVE POSTIVE SARS-CoV-2 nucleic acids MAY BE PRESENT.   A presumptive positive result was obtained on the submitted specimen  and confirmed on repeat testing.  While 2019 novel coronavirus  (SARS-CoV-2) nucleic acids may be present in the submitted sample  additional confirmatory testing may be necessary for epidemiological  and / or clinical management purposes  to differentiate between  SARS-CoV-2 and other Sarbecovirus currently known to infect humans.  If clinically indicated additional testing with an alternate test  methodology [phone]) is advised. The SARS-CoV-2 RNA is generally  detectable in upper and lower respiratory sp ecimens during the acute  phase of infection. The expected result is Negative. Fact Sheet for Patients:  BoilerBrush.com.cy Fact Sheet for Healthcare Providers: https://pope.com/ This test is not yet approved or cleared by the [place] FDA and has been authorized for detection and/or diagnosis of SARS-CoV-2 by FDA under an Emergency Use Authorization (EUA).  This EUA will remain in effect (meaning this test can be used) for the duration of the COVID-19 declaration under Section 564(b)(1) of the Act, 21 U.S.C. section 360bbb-3(b)(1), unless the authorization is terminated or revoked sooner. Performed at [facility], [address] [place], [place] [address]   Blood gas, venous     Status: Abnormal (Preliminary result)   Collection Time: 06/06/18 12:56 AM  Result Value Ref Range   pH, Ven 6.95 (LL) 7.250 - 7.430    Comment: CRITICAL RESULT CALLED TO, READ BACK BY AND VERIFIED WITH: DR. Manson Passey @ 0140  06/06/2018   JCG    pCO2, Ven PENDING 44.0 - 60.0 mmHg   pO2, Ven  55.0 (H) 32.0 - 45.0 mmHg   Bicarbonate 3.1 (L) 20.0 - 28.0 mmol/L   Acid-base deficit 27.5 (H) 0.0 - 2.0 mmol/L   O2 Saturation 63.5 %   Patient temperature 37.0    Collection site VENOUS    Sample type VENOUS     Comment: Performed at [facility], [address] [place], [place] [address]  Glucose, capillary     Status: Abnormal   Collection Time: 06/06/18  2:29 AM  Result Value Ref Range   Glucose-Capillary 292 (H) 70 - 99 mg/dL   Dg Chest Port 1 View  Result Date: 06/05/2018 CLINICAL DATA:  Shortness of breath EXAM: PORTABLE CHEST 1 VIEW COMPARISON:  None. FINDINGS: Heart and mediastinal contours are within normal limits. No focal opacities or effusions. No acute bony abnormality. IMPRESSION: No active disease. Electronically Signed   By: Charlett Nose M.D.   On: 06/05/2018 23:19    Pending Labs Unresulted Labs (From admission, onward)    Start     Ordered   06/07/18 0500  Procalcitonin  Daily,   STAT     06/06/18 0215   06/06/18 0222  Beta-hydroxybutyric acid  Once,   STAT     06/06/18 0221   06/06/18 0216  Culture, Urine  Add-on,   AD     06/06/18 0215   06/06/18 0216  Procalcitonin - Baseline  ONCE - STAT,   STAT     06/06/18 0215   06/06/18 0136  Lactic acid, plasma  Now then every 2 hours,   STAT     06/06/18 0136   06/06/18 0136  Blood Culture (routine x 2)  BLOOD CULTURE X 2,   STAT     06/06/18 0136   06/06/18 0136  Urinalysis, Routine w reflex microscopic  ONCE -  STAT,   STAT     06/06/18 0136   Signed and Held  HIV antibody (Routine Testing)  Once,   R     Signed and Held   Signed and Held  Basic metabolic panel  STAT Now then every 4 hours ,   STAT     Signed and Held   Signed and Held  CBC  (enoxaparin (LOVENOX)    CrCl >/= 30 ml/min)  Once,   R    Comments:  Baseline for enoxaparin therapy IF NOT ALREADY DRAWN.  Notify MD if PLT < 100 K.    Signed and Held   Signed and Held  Creatinine, serum  (enoxaparin (LOVENOX)    CrCl >/= 30 ml/min)  Once,    R    Comments:  Baseline for enoxaparin therapy IF NOT ALREADY DRAWN.    Signed and Held   Signed and Held  Creatinine, serum  (enoxaparin (LOVENOX)    CrCl >/= 30 ml/min)  Weekly,   R    Comments:  while on enoxaparin therapy    Signed and Held          Vitals/Pain Today's Vitals   06/05/18 2225 06/05/18 2229 06/06/18 0146 06/06/18 0200  BP:  (!) 168/105 (!) 198/91 (!) 185/96  Pulse:  (!) 134 (!) 130 (!) 149  Resp:  (!) 28 (!) 36 (!) 27  Temp:  97.8 F (36.6 C) 97.8 F (36.6 C)   TempSrc:  Oral    SpO2:  98% 97% 100%  Weight: 83.9 kg     Height: 5\' 1"  (1.549 m)     PainSc:        Isolation Precautions Droplet and Contact precautions  Medications Medications  metroNIDAZOLE (FLAGYL) IVPB 500 mg (500 mg Intravenous New Bag/Given 06/06/18 0227)  vancomycin (VANCOCIN) 1,500 mg in sodium chloride 0.9 % 500 mL IVPB (has no administration in time range)  insulin regular, human (MYXREDLIN) 100 units/ 100 mL infusion (2.3 Units/hr Intravenous New Bag/Given 06/06/18 0238)  potassium chloride 10 mEq in 100 mL IVPB (has no administration in time range)  ceFEPIme (MAXIPIME) 2 g in sodium chloride 0.9 % 100 mL IVPB (2 g Intravenous New Bag/Given 06/06/18 0216)  sodium bicarbonate 150 mEq in sterile water 1,000 mL infusion (has no administration in time range)  sodium bicarbonate injection 100 mEq (has no administration in time range)  sodium chloride 0.9 % bolus 1,000 mL (1,000 mLs Intravenous New Bag/Given 06/05/18 2359)  sodium chloride 0.9 % bolus 1,000 mL (1,000 mLs Intravenous New Bag/Given 06/05/18 2352)  morphine 2 MG/ML injection 2 mg (2 mg Intravenous Given 06/05/18 2355)  ondansetron (ZOFRAN) injection 4 mg (4 mg Intravenous Given 06/05/18 2354)  sodium bicarbonate 1 mEq/mL injection (100 mEq  Given 06/06/18 0242)    Mobility walks Low fall risk   Focused Assessments n/a   R Recommendations: See Admitting Provider Note  Report given to:   Additional Notes:  Son is the family  calling and checking on her

## 2018-06-07 DIAGNOSIS — E1010 Type 1 diabetes mellitus with ketoacidosis without coma: Principal | ICD-10-CM

## 2018-06-07 LAB — CBC WITH DIFFERENTIAL/PLATELET
Abs Immature Granulocytes: 0.14 10*3/uL — ABNORMAL HIGH (ref 0.00–0.07)
Basophils Absolute: 0.1 10*3/uL (ref 0.0–0.1)
Basophils Relative: 1 %
Eosinophils Absolute: 0 10*3/uL (ref 0.0–0.5)
Eosinophils Relative: 0 %
HCT: 30.4 % — ABNORMAL LOW (ref 36.0–46.0)
Hemoglobin: 9.8 g/dL — ABNORMAL LOW (ref 12.0–15.0)
Immature Granulocytes: 1 %
Lymphocytes Relative: 10 %
Lymphs Abs: 1.6 10*3/uL (ref 0.7–4.0)
MCH: 25.7 pg — ABNORMAL LOW (ref 26.0–34.0)
MCHC: 32.2 g/dL (ref 30.0–36.0)
MCV: 79.6 fL — ABNORMAL LOW (ref 80.0–100.0)
Monocytes Absolute: 1.5 10*3/uL — ABNORMAL HIGH (ref 0.1–1.0)
Monocytes Relative: 9 %
Neutro Abs: 13 10*3/uL — ABNORMAL HIGH (ref 1.7–7.7)
Neutrophils Relative %: 79 %
Platelets: 391 10*3/uL (ref 150–400)
RBC: 3.82 MIL/uL — ABNORMAL LOW (ref 3.87–5.11)
RDW: 13.9 % (ref 11.5–15.5)
WBC: 16.3 10*3/uL — ABNORMAL HIGH (ref 4.0–10.5)
nRBC: 0 % (ref 0.0–0.2)

## 2018-06-07 LAB — GLUCOSE, CAPILLARY
Glucose-Capillary: 138 mg/dL — ABNORMAL HIGH (ref 70–99)
Glucose-Capillary: 139 mg/dL — ABNORMAL HIGH (ref 70–99)
Glucose-Capillary: 142 mg/dL — ABNORMAL HIGH (ref 70–99)
Glucose-Capillary: 152 mg/dL — ABNORMAL HIGH (ref 70–99)
Glucose-Capillary: 163 mg/dL — ABNORMAL HIGH (ref 70–99)
Glucose-Capillary: 177 mg/dL — ABNORMAL HIGH (ref 70–99)
Glucose-Capillary: 187 mg/dL — ABNORMAL HIGH (ref 70–99)
Glucose-Capillary: 188 mg/dL — ABNORMAL HIGH (ref 70–99)
Glucose-Capillary: 188 mg/dL — ABNORMAL HIGH (ref 70–99)
Glucose-Capillary: 189 mg/dL — ABNORMAL HIGH (ref 70–99)
Glucose-Capillary: 190 mg/dL — ABNORMAL HIGH (ref 70–99)
Glucose-Capillary: 204 mg/dL — ABNORMAL HIGH (ref 70–99)
Glucose-Capillary: 220 mg/dL — ABNORMAL HIGH (ref 70–99)
Glucose-Capillary: 245 mg/dL — ABNORMAL HIGH (ref 70–99)

## 2018-06-07 LAB — BASIC METABOLIC PANEL
Anion gap: 12 (ref 5–15)
Anion gap: 12 (ref 5–15)
BUN: 26 mg/dL — ABNORMAL HIGH (ref 6–20)
BUN: 22 mg/dL — ABNORMAL HIGH (ref 6–20)
CO2: 17 mmol/L — ABNORMAL LOW (ref 22–32)
CO2: 18 mmol/L — ABNORMAL LOW (ref 22–32)
Calcium: 7.8 mg/dL — ABNORMAL LOW (ref 8.9–10.3)
Calcium: 7.6 mg/dL — ABNORMAL LOW (ref 8.9–10.3)
Chloride: 115 mmol/L — ABNORMAL HIGH (ref 98–111)
Chloride: 113 mmol/L — ABNORMAL HIGH (ref 98–111)
Creatinine, Ser: 0.91 mg/dL (ref 0.44–1.00)
Creatinine, Ser: 0.77 mg/dL (ref 0.44–1.00)
GFR calc Af Amer: >60 mL/min (ref >60)
GFR calc Af Amer: >60 mL/min (ref >60)
GFR calc non Af Amer: >60 mL/min (ref >60)
GFR calc non Af Amer: >60 mL/min (ref >60)
Glucose, Bld: 140 mg/dL — ABNORMAL HIGH (ref 70–99)
Glucose, Bld: 164 mg/dL — ABNORMAL HIGH (ref 70–99)
Potassium: 2.5 mmol/L — CL (ref 3.5–5.1)
Potassium: 3.3 mmol/L — ABNORMAL LOW (ref 3.5–5.1)
Sodium: 144 mmol/L (ref 135–145)
Sodium: 143 mmol/L (ref 135–145)

## 2018-06-07 LAB — HIV ANTIBODY (ROUTINE TESTING W REFLEX): HIV Screen 4th Generation wRfx: NONREACTIVE

## 2018-06-07 LAB — HEMOGLOBIN A1C
Hgb A1c MFr Bld: 9.2 % — ABNORMAL HIGH (ref 4.8–5.6)
Mean Plasma Glucose: 217.34 mg/dL

## 2018-06-07 LAB — URINE CULTURE: Culture: NO GROWTH

## 2018-06-07 LAB — PROCALCITONIN: Procalcitonin: 0.42 ng/mL

## 2018-06-07 MED ORDER — POTASSIUM CHLORIDE CRYS ER 20 MEQ PO TBCR: 40.0000 meq | EXTENDED_RELEASE_TABLET | Freq: Once | ORAL | Status: DC

## 2018-06-07 MED ORDER — SODIUM BICARBONATE 8.4 % IV SOLN
INTRAVENOUS | Status: DC
  Administered 2018-06-07: 07:00:00 via INTRAVENOUS
  Filled 2018-06-07: qty 850

## 2018-06-07 MED ORDER — INSULIN ASPART 100 UNIT/ML ~~LOC~~ SOLN: 0.0000 [IU] | Freq: Every day | SUBCUTANEOUS | Status: DC

## 2018-06-07 MED ORDER — INSULIN ASPART 100 UNIT/ML ~~LOC~~ SOLN
0.0000 [IU] | Freq: Three times a day (TID) | SUBCUTANEOUS | Status: DC
  Administered 2018-06-07: 3 [IU] via SUBCUTANEOUS
  Filled 2018-06-07: qty 1

## 2018-06-07 MED ORDER — STERILE WATER FOR INJECTION IV SOLN
INTRAVENOUS | Status: DC
  Filled 2018-06-07 (×3): qty 850

## 2018-06-07 MED ORDER — INSULIN ASPART 100 UNIT/ML ~~LOC~~ SOLN
4.0000 [IU] | Freq: Three times a day (TID) | SUBCUTANEOUS | Status: DC
  Administered 2018-06-07 – 2018-06-08 (×5): 4 [IU] via SUBCUTANEOUS
  Filled 2018-06-07 (×5): qty 1

## 2018-06-07 MED ORDER — METOPROLOL TARTRATE 25 MG PO TABS
12.5000 mg | ORAL_TABLET | Freq: Two times a day (BID) | ORAL | Status: DC
  Administered 2018-06-07 (×2): 12.5 mg via ORAL
  Filled 2018-06-07 (×3): qty 1

## 2018-06-07 MED ORDER — INSULIN ASPART 100 UNIT/ML ~~LOC~~ SOLN
0.0000 [IU] | Freq: Three times a day (TID) | SUBCUTANEOUS | Status: DC
  Administered 2018-06-07: 2 [IU] via SUBCUTANEOUS
  Administered 2018-06-07: 11:00:00 3 [IU] via SUBCUTANEOUS
  Administered 2018-06-08: 5 [IU] via SUBCUTANEOUS
  Administered 2018-06-08: 13:00:00 3 [IU] via SUBCUTANEOUS
  Administered 2018-06-08: 5 [IU] via SUBCUTANEOUS
  Filled 2018-06-07 (×5): qty 1

## 2018-06-07 MED ORDER — POTASSIUM CHLORIDE 10 MEQ/100ML IV SOLN
10.0000 meq | INTRAVENOUS | Status: AC
  Administered 2018-06-07 (×4): 10 meq via INTRAVENOUS
  Filled 2018-06-07 (×4): qty 100

## 2018-06-07 MED ORDER — INSULIN GLARGINE 100 UNIT/ML ~~LOC~~ SOLN
20.0000 [IU] | Freq: Every day | SUBCUTANEOUS | Status: DC
  Administered 2018-06-07 – 2018-06-08 (×2): 20 [IU] via SUBCUTANEOUS
  Filled 2018-06-07 (×2): qty 0.2

## 2018-06-07 MED ORDER — POTASSIUM CHLORIDE CRYS ER 20 MEQ PO TBCR
20.0000 meq | EXTENDED_RELEASE_TABLET | Freq: Once | ORAL | Status: AC
  Administered 2018-06-07: 18:00:00 20 meq via ORAL
  Filled 2018-06-07: qty 1

## 2018-06-07 MED ORDER — INSULIN ASPART 100 UNIT/ML ~~LOC~~ SOLN
0.0000 [IU] | Freq: Every day | SUBCUTANEOUS | Status: DC
  Administered 2018-06-07: 2 [IU] via SUBCUTANEOUS
  Filled 2018-06-07: qty 1

## 2018-06-07 NOTE — Progress Notes (Signed)
Pharmacy Electrolyte Monitoring Consult:  Pharmacy consulted to assist in monitoring and replacing electrolytes in this 53 y.o. female admitted on 06/05/2018 with DKA.  Labs:  Sodium (mmol/L)  Date Value  06/07/2018 143  04/04/2018 138   Potassium (mmol/L)  Date Value  06/07/2018 3.3 (L)   Phosphorus (mg/dL)  Date Value  50/38/8828 3.5   Calcium (mg/dL)  Date Value  00/34/9179 7.6 (L)   Albumin (g/dL)  Date Value  15/05/6977 3.9    Assessment/Plan: 5/7: Patient transitioned off of insulin drip.  Patient received potassium IV Q1hr x 4.  No further replacement warranted.  Will replace to keep electrolyte within normal limits.  Will obtain electrolytes with am labs.   5/7 addendum: KCL 3.3 at 0413.  Patient received 2 of the 4 doses of KCL 10 meq IV doses after the 0413 am lab. MD has ordered Magnesium level in am. Will order KCL 20 meq x 1. F/u labs in am  Pharmacy will continue to monitor and adjust per consult.    Terianna Peggs A 06/07/2018 3:58 PM

## 2018-06-07 NOTE — Progress Notes (Signed)
Inpatient Diabetes Program Recommendations  AACE/ADA: New Consensus Statement on Inpatient Glycemic Control (2015)  Target Ranges:  Prepandial:   less than 140 mg/dL      Peak postprandial:   less than 180 mg/dL (1-2 hours)      Critically ill patients:  140 - 180 mg/dL   Lab Results  Component Value Date   GLUCAP 163 (H) 06/07/2018   HGBA1C 8.9 (H) 04/04/2018    Review of Glycemic Control Results for Lori Carr, Lori Carr (MRN [id number]) as of 06/07/2018 10:37  Ref. Range 06/07/2018 04:18 06/07/2018 05:19 06/07/2018 06:28 06/07/2018 07:30 06/07/2018 08:09  Glucose-Capillary Latest Ref Range: 70 - 99 mg/dL 413 (H) 244 (H) 010 (H) 189 (H) 163 (H)   Diabetes history: DM 1- since age 86 Outpatient Diabetes medications:  Lantus 28 units daily Novolog 6 units tid with meals (titrates depending on what she eats) Xigduo XR 10-998 mg q AM (SBGT-2 and metformin combination) Current orders for Inpatient glycemic control:  Lantus 20 units daily Novolog moderate tid with meals and HS  Inpatient Diabetes Program Recommendations:    Please change Novolog correction to sensitive tid with meals and HS.  Also please add Novolog meal coverage 4 units tid with meals (patient has Type 1 DM and makes no insulin).  Thanks  Beryl Meager, RN, BC-ADM Inpatient Diabetes Coordinator Pager [phone] (8a-5p)

## 2018-06-07 NOTE — Progress Notes (Signed)
Pharmacy Electrolyte Monitoring Consult:  Pharmacy consulted to assist in monitoring and replacing electrolytes in this 53 y.o. female admitted on 06/05/2018 with DKA.  Labs:  Sodium (mmol/L)  Date Value  06/07/2018 143  04/04/2018 138   Potassium (mmol/L)  Date Value  06/07/2018 3.3 (L)   Phosphorus (mg/dL)  Date Value  82/95/6213 3.5   Calcium (mg/dL)  Date Value  08/65/7846 7.6 (L)   Albumin (g/dL)  Date Value  96/29/5284 3.9    Assessment/Plan: Patient transitioned off of insulin drip.   Patient received potassium IV Q1hr x 4.   No further replacement warranted.   Will replace to keep electrolyte within normal limits.   Will obtain electrolytes with am labs.   Pharmacy will continue to monitor and adjust per consult.    Adriyanna Christians L 06/07/2018 9:17 AM

## 2018-06-07 NOTE — Progress Notes (Signed)
Patient alert and oriented. No complaints of pain or shortness of breath. She is on room air. Insulin drip titrated off, lantus and sliding scale given. Patient tolerating diet and drinking.Patient awaiting for transfer to floor. Will continue to monitor.

## 2018-06-07 NOTE — Progress Notes (Signed)
[facility] - [place] at [facility]   PATIENT NAME: Lori Carr    MR#:  [id number]  DATE OF BIRTH:  March 03, 1965  SUBJECTIVE:  CHIEF COMPLAINT: Patient is feeling better  tachycardia improving off insulin drip.  Came to the floor  REVIEW OF SYSTEMS:  CONSTITUTIONAL: No fever, fatigue or weakness.  EYES: No blurred or double vision.  EARS, NOSE, AND THROAT: No tinnitus or ear pain.  RESPIRATORY: No cough, shortness of breath, wheezing or hemoptysis.  CARDIOVASCULAR: No chest pain, orthopnea, edema.  GASTROINTESTINAL: No nausea, vomiting, diarrhea or abdominal pain.  GENITOURINARY: No dysuria, hematuria.  ENDOCRINE: No polyuria, nocturia,  HEMATOLOGY: No anemia, easy bruising or bleeding SKIN: No rash or lesion. MUSCULOSKELETAL: No joint pain or arthritis.   NEUROLOGIC: No tingling, numbness, weakness.  PSYCHIATRY: No anxiety or depression.   DRUG ALLERGIES:   Allergies  Allergen Reactions  . Amoxicillin-Pot Clavulanate Itching    Did it involve swelling of the face/tongue/throat, SOB, or low BP? No Did it involve sudden or severe rash/hives, skin peeling, or any reaction on the inside of your mouth or nose? No Did you need to seek medical attention at a hospital or doctor's office? No When did it last happen?A long time ago If all above answers are "NO", may proceed with cephalosporin use.  . Vicodin [Hydrocodone-Acetaminophen] Nausea Only    VITALS:  Blood pressure 112/66, pulse (!) 111, temperature 98.8 F (37.1 C), temperature source Oral, resp. rate 14, height 5\' 2"  (1.575 m), weight 78.7 kg, last menstrual period 06/04/2018, SpO2 99 %.  PHYSICAL EXAMINATION:  GENERAL:  53 y.o.-year-old patient lying in the bed with no acute distress.  EYES: Pupils equal, round, reactive to light and accommodation. No scleral icterus. Extraocular muscles intact.  HEENT: Head atraumatic, normocephalic. Oropharynx and nasopharynx clear.  NECK:  Supple, no  jugular venous distention. No thyroid enlargement, no tenderness.  LUNGS: Normal breath sounds bilaterally, no wheezing, rales,rhonchi or crepitation. No use of accessory muscles of respiration.  CARDIOVASCULAR: S1, S2 normal. No murmurs, rubs, or gallops.  ABDOMEN: Soft, nontender, nondistended. Bowel sounds present.   EXTREMITIES: No pedal edema, cyanosis, or clubbing.  NEUROLOGIC: Cranial nerves II through XII are intact. Muscle strength-global weakness sensation intact. Gait not checked.  PSYCHIATRIC: The patient is alert and oriented x 3.  SKIN: No obvious rash, lesion, or ulcer.    LABORATORY PANEL:   CBC Recent Labs  Lab 06/07/18 0413  WBC 16.3*  HGB 9.8*  HCT 30.4*  PLT 391   ------------------------------------------------------------------------------------------------------------------  Chemistries  Recent Labs  Lab 06/07/18 0413  NA 143  K 3.3*  CL 113*  CO2 18*  GLUCOSE 164*  BUN 22*  CREATININE 0.77  CALCIUM 7.6*   ------------------------------------------------------------------------------------------------------------------  Cardiac Enzymes No results for input(s): TROPONINI in the last 168 hours. ------------------------------------------------------------------------------------------------------------------  RADIOLOGY:  Ct Abdomen Pelvis Wo Contrast  Result Date: 06/06/2018 CLINICAL DATA:  Sepsis. Inpatient. Severe diabetic ketoacidosis. Urinalysis concerning for UTI. EXAM: CT ABDOMEN AND PELVIS WITHOUT CONTRAST TECHNIQUE: Multidetector CT imaging of the abdomen and pelvis was performed following the standard protocol without IV contrast. COMPARISON:  Chest CT angiogram from earlier today. FINDINGS: Lower chest: There are patchy ground-glass opacities throughout the periphery of both lung bases. There is evidence of air trapping within a pulmonary sequestration at the posteromedial right lung base with probable arterial supply arising from celiac trunk  as seen on chest CT angiogram from earlier today. These findings were obscured by severe respiratory motion artifact  on the chest CT angiogram performed earlier today. Hepatobiliary: Normal liver size. No liver mass. Normal gallbladder with no radiopaque cholelithiasis. No biliary ductal dilatation. Pancreas: Normal, with no mass or duct dilation. Spleen: Normal size. No mass. Adrenals/Urinary Tract: Normal adrenals. There are striated contrast nephrograms in both kidneys on this noncontrast study. Excreted contrast is seen in the renal collecting systems, ureters and bladder. No hydronephrosis. No contour deforming renal masses. Normal bladder. Stomach/Bowel: Normal non-distended stomach. Normal caliber small bowel with no small bowel wall thickening. Normal appendix. Normal large bowel with no diverticulosis, large bowel wall thickening or pericolonic fat stranding. Vascular/Lymphatic: Normal caliber abdominal aorta. No pathologically enlarged lymph nodes in the abdomen or pelvis. Reproductive: Mildly enlarged globular left deviated anteverted uterus. No adnexal masses. Other: No pneumoperitoneum, ascites or focal fluid collection. Musculoskeletal: No aggressive appearing focal osseous lesions. Moderate thoracolumbar spondylosis, most prominent at L5-S1. IMPRESSION: 1. Patchy ground-glass opacity throughout the periphery of both lung bases (which was obscured by motion artifact on the chest CT angiogram study performed earlier today). This finding is presumably inflammatory, with atypical infection including viral pneumonia on the differential. Note is made of a negative result on SARS coronavirus 2 swab from earlier today. 2. 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 3. Serrated contrast nephrograms in both kidneys on this noncontrast study, which may indicate a degree of acute renal failure. No hydronephrosis. Recommend serial serum creatinine correlation. 4.  Otherwise no acute abnormality in the abdomen or pelvis. No fluid collections. Electronically Signed   By: Delbert Phenix M.D.   On: 06/06/2018 12:27   Ct Angio Chest Pe W And/or Wo Contrast  Result Date: 06/06/2018 CLINICAL DATA:  Chest pain EXAM: CT ANGIOGRAPHY CHEST WITH CONTRAST TECHNIQUE: Multidetector CT imaging of the chest was performed using the standard protocol during bolus administration of intravenous contrast. Multiplanar CT image reconstructions and MIPs were obtained to evaluate the vascular anatomy. CONTRAST:  75mL OMNIPAQUE IOHEXOL 350 MG/ML SOLN COMPARISON:  Chest x-ray 06/05/2018 FINDINGS: Cardiovascular: Respiratory motion obscures the vessels in the lower lobes. No visualized large or central pulmonary emboli. Heart is normal size. Aorta is normal caliber. Mediastinum/Nodes: No mediastinal, hilar, or axillary adenopathy. Lungs/Pleura: Lungs are clear. No focal airspace opacities or suspicious nodules. No effusions. Calcified granuloma in the left upper lobe. Upper Abdomen: Imaging into the upper abdomen shows no acute findings. Musculoskeletal: Chest wall soft tissues are unremarkable. No acute bony abnormality. Review of the MIP images confirms the above findings. IMPRESSION: No evidence of large or central pulmonary emboli. The lower lobe pulmonary arterial branches are obscured by respiratory motion. Old granulomatous disease. No visualized acute cardiopulmonary disease. Electronically Signed   By: Charlett Nose M.D.   On: 06/06/2018 03:43   US Venous Img Lower Bilateral  Result Date: 06/06/2018 CLINICAL DATA:  Lower extremity edema, pain EXAM: BILATERAL LOWER EXTREMITY VENOUS DOPPLER ULTRASOUND TECHNIQUE: Gray-scale sonography with compression, as well as color and duplex ultrasound, were performed to evaluate the deep venous system from the level of the common femoral vein through the popliteal and proximal calf veins. COMPARISON:  None FINDINGS: Normal compressibility of the common  femoral, superficial femoral, and popliteal veins, as well as the proximal calf veins. No filling defects to suggest DVT on grayscale or color Doppler imaging. Doppler waveforms show normal direction of venous flow, normal respiratory phasicity and response to augmentation. Visualized segments of the saphenous venous systems normal in caliber and compressibility. IMPRESSION: No femoropopliteal and no  calf DVT in the visualized calf veins. If clinical symptoms are inconsistent or if there are persistent or worsening symptoms, further imaging (possibly involving the iliac veins) may be warranted. Electronically Signed   By: Corlis Leak  Hassell M.D.   On: 06/06/2018 12:53   Dg Chest Port 1 View  Result Date: 06/05/2018 CLINICAL DATA:  Shortness of breath EXAM: PORTABLE CHEST 1 VIEW COMPARISON:  None. FINDINGS: Heart and mediastinal contours are within normal limits. No focal opacities or effusions. No acute bony abnormality. IMPRESSION: No active disease. Electronically Signed   By: Charlett NoseKevin  Dover M.D.   On: 06/05/2018 23:19    EKG:   Orders placed or performed during the hospital encounter of 06/05/18  . EKG 12-Lead  . EKG 12-Lead  . ED EKG  . ED EKG    ASSESSMENT AND PLAN:   #DKA-could be from the new medicine xigduo/metformin Insulin drip continued, aggressive hydration with IV fluids provided Anion gap closed and DKA resolved Stop the new medicineXigduo XR 10-998 mg q AM (SBGT-2 and metformin combination  COVID test negative At home she takes Lantus 28 units daily and currently she is on 20 units daily NovoLog sliding scale and meal coverage Diabetic coronaries following  #Acute kidney injury secondary to dehydration from DKA Resolved Aggressive hydration with IV fluids  #Hypokalemia replete and recheck in a.m.  #Chronic vaginal bleeding Patient has been following with gynecology continue follow-up and monitor's hemoglobin  #Leukocytosis-probably reactive  Improving  White blood cell  count 32.7--16.3  blood cultures are negative, urine cultures negative.  MRSA PCR positive Chest x-ray negative On empiric antibiotics cefepime and vancomycin which are discontinued COVID negative  #Sinus tachycardia-could be from dehydration Hydrated with IV fluids Small dose metoprolol added to the regimen will titrate dose as needed  #Obesity Scheduled to get gastric sleeve this Friday.  Patient is canceling the procedure for now    All the records are reviewed and case discussed with Care Management/Social Workerr. Management plans discussed with the patient, she is  in agreement.  CODE STATUS: fc  TOTAL TIME TAKING CARE OF THIS PATIENT: 36 minutes.   POSSIBLE D/C IN 2  DAYS, DEPENDING ON CLINICAL CONDITION.  Note: This dictation was prepared with Dragon dictation along with smaller phrase technology. Any transcriptional errors that result from this process are unintentional.   Ramonita LabAruna Roshawnda Pecora M.D on 06/07/2018 at 3:16 PM  Between 7am to 6pm - Pager - [phone] After 6pm go to [email or web address] - password [email or web address] [email or web address]  [facility]  Office  [phone]  CC: Primary care physician; Gilles Chiquitoabinowitz, Joseph H, MD

## 2018-06-07 NOTE — Progress Notes (Signed)
After further assessment and discussion of patient's  status and medical condition during multidisciplinary rounds, the plan is outlined as below:  1.  Discussed during multidisciplinary rounds, she has "closed the gap", transitioned to subcu insulin.  2.  She exhibited exaggerated acidosis with episode of DKA, she also had lactic acidosis.  Query whether this was metformin effect.  Recommend avoiding this medication.  3.  Tachycardia that has been responsive to low-dose beta-blocker.  Will institute metoprolol 12.5 mg twice a day.  Reassess after her metabolic issues have corrected.  4.  May transfer to the general medical floor.   Gailen Shelter, M.D.  [facility]

## 2018-06-08 ENCOUNTER — Other Ambulatory Visit: Payer: Self-pay

## 2018-06-08 LAB — BASIC METABOLIC PANEL
Anion gap: 9 (ref 5–15)
BUN: 10 mg/dL (ref 6–20)
CO2: 23 mmol/L (ref 22–32)
Calcium: 7.7 mg/dL — ABNORMAL LOW (ref 8.9–10.3)
Chloride: 109 mmol/L (ref 98–111)
Creatinine, Ser: 0.54 mg/dL (ref 0.44–1.00)
GFR calc Af Amer: >60 mL/min (ref >60)
GFR calc non Af Amer: >60 mL/min (ref >60)
Glucose, Bld: 216 mg/dL — ABNORMAL HIGH (ref 70–99)
Potassium: 2.9 mmol/L — ABNORMAL LOW (ref 3.5–5.1)
Sodium: 141 mmol/L (ref 135–145)

## 2018-06-08 LAB — CBC
HCT: 28.3 % — ABNORMAL LOW (ref 36.0–46.0)
Hemoglobin: 9.1 g/dL — ABNORMAL LOW (ref 12.0–15.0)
MCH: 25.7 pg — ABNORMAL LOW (ref 26.0–34.0)
MCHC: 32.2 g/dL (ref 30.0–36.0)
MCV: 79.9 fL — ABNORMAL LOW (ref 80.0–100.0)
Platelets: 337 10*3/uL (ref 150–400)
RBC: 3.54 MIL/uL — ABNORMAL LOW (ref 3.87–5.11)
RDW: 14.2 % (ref 11.5–15.5)
WBC: 7.6 10*3/uL (ref 4.0–10.5)
nRBC: 0 % (ref 0.0–0.2)

## 2018-06-08 LAB — GLUCOSE, CAPILLARY
Glucose-Capillary: 254 mg/dL — ABNORMAL HIGH (ref 70–99)
Glucose-Capillary: 231 mg/dL — ABNORMAL HIGH (ref 70–99)
Glucose-Capillary: 274 mg/dL — ABNORMAL HIGH (ref 70–99)

## 2018-06-08 LAB — POTASSIUM: Potassium: 3.4 mmol/L — ABNORMAL LOW (ref 3.5–5.1)

## 2018-06-08 LAB — PHOSPHORUS: Phosphorus: <1 mg/dL — CL (ref 2.5–4.6)

## 2018-06-08 LAB — MAGNESIUM: Magnesium: 2.2 mg/dL (ref 1.7–2.4)

## 2018-06-08 MED ORDER — INSULIN ASPART 100 UNIT/ML ~~LOC~~ SOLN: 4.0000 [IU] | Freq: Three times a day (TID) | SUBCUTANEOUS | 11 refills | Status: DC

## 2018-06-08 MED ORDER — METOPROLOL TARTRATE 25 MG PO TABS: 12.5000 mg | ORAL_TABLET | Freq: Two times a day (BID) | ORAL | 1 refills | Status: DC

## 2018-06-08 MED ORDER — SODIUM CHLORIDE 0.9 % IV SOLN
Freq: Once | INTRAVENOUS | Status: AC
  Administered 2018-06-08: 15:00:00 via INTRAVENOUS
  Filled 2018-06-08: qty 10

## 2018-06-08 MED ORDER — POTASSIUM CHLORIDE 10 MEQ/100ML IV SOLN
10.0000 meq | INTRAVENOUS | Status: AC
  Administered 2018-06-08 (×4): 10 meq via INTRAVENOUS
  Filled 2018-06-08 (×4): qty 100

## 2018-06-08 MED ORDER — POTASSIUM PHOSPHATES 15 MMOLE/5ML IV SOLN: 30.0000 mmol | Freq: Once | INTRAVENOUS | Status: DC

## 2018-06-08 MED ORDER — INSULIN GLARGINE 100 UNIT/ML ~~LOC~~ SOLN
5.0000 [IU] | Freq: Once | SUBCUTANEOUS | Status: AC
  Administered 2018-06-08: 5 [IU] via SUBCUTANEOUS
  Filled 2018-06-08: qty 0.05

## 2018-06-08 MED ORDER — POTASSIUM PHOSPHATE MONOBASIC 500 MG PO TABS: 500.0000 mg | ORAL_TABLET | Freq: Three times a day (TID) | ORAL | 0 refills | Status: AC

## 2018-06-08 MED ORDER — POTASSIUM CHLORIDE CRYS ER 20 MEQ PO TBCR
40.0000 meq | EXTENDED_RELEASE_TABLET | Freq: Once | ORAL | Status: AC
  Administered 2018-06-08: 40 meq via ORAL
  Filled 2018-06-08: qty 2

## 2018-06-08 MED ORDER — POTASSIUM PHOSPHATE MONOBASIC 500 MG PO TABS
500.0000 mg | ORAL_TABLET | Freq: Three times a day (TID) | ORAL | Status: DC
  Filled 2018-06-08: qty 1

## 2018-06-08 MED ORDER — INSULIN GLARGINE 100 UNIT/ML ~~LOC~~ SOLN
24.0000 [IU] | Freq: Every day | SUBCUTANEOUS | Status: DC
  Filled 2018-06-08: qty 0.24

## 2018-06-08 MED ORDER — FREESTYLE LIBRE 14 DAY SENSOR MISC: 26.0000 [IU] | 3 refills | Status: DC

## 2018-06-08 NOTE — Progress Notes (Signed)
Pt was discharged home at 2145 with husband. IV removed. Discharge  Instruction explained with patient, pt verbalized understanding with teach back. No signs of distress noted at the time of discharge.

## 2018-06-08 NOTE — Progress Notes (Signed)
Inpatient Diabetes Program Recommendations  AACE/ADA: New Consensus Statement on Inpatient Glycemic Control (2015)  Target Ranges:  Prepandial:   less than 140 mg/dL      Peak postprandial:   less than 180 mg/dL (1-2 hours)      Critically ill patients:  140 - 180 mg/dL   Lab Results  Component Value Date   GLUCAP 254 (H) 06/08/2018   HGBA1C 9.2 (H) 06/07/2018    Review of Glycemic Control Results for JODDIE, DIGIORGIO (MRN [id number]) as of 06/08/2018 12:07  Ref. Range 06/07/2018 08:09 06/07/2018 11:16 06/07/2018 17:16 06/07/2018 21:40 06/08/2018 07:46  Glucose-Capillary Latest Ref Range: 70 - 99 mg/dL 563 (H) 149 (H) 702 (H) 220 (H) 254 (H)   Diabetes history: DM since 2007 (states that her c-peptide was low when checked within the last 10 years) Outpatient Diabetes medications: Lantus 26 units daily, Novolog 4-6 units with meals, Xigduo XR 10-998 mg q AM (states she has been on for the past 2 years) Current orders for Inpatient glycemic control:  Lantus 24 units daily, Novolog 4 units tid with meals, Novolog 0-9 units tid with meals and HS  Inpatient Diabetes Program Recommendations:    Spoke at length with patient at bedside.  She states that she had been feeling badly since last week. She noted that she was short of breath over the weekend as well.  We discussed in detail "DKA" and how the cells in the body are not getting glucose due to insulin omission/reduction.  She admits to not taking her insulin as prescribed and getting "slack".  She has been anticipating gastric bypass surgery but states that she has decided not to do this.  I explained that gastric bypass surgery would likely not diminish her need for insulin due to her bodies insulin deficiency.  She states that she is managed by the city doctor and does not have an endocrinologist.  We discussed various options for f/u with endocrinology- I will e-mail her a list of local endocrinologist. We also discussed possible ways to improve her  glycemic control such as more monitoring.  Explained CGM technology.  She is very interested.  Discussed with MD.  Will send patient links for how to apply sensor and MD will order at d/c.  Patient also is interested in having a correction scale so that if her blood sugars are high, she can give extra insulin.   Patient seems very engaged and knows what needs to be done to improve her control.  She understands that SGLT-2 will not be restarted.  Will send patient requested educational materials as well.   Thanks  Beryl Meager, RN, BC-ADM Inpatient Diabetes Coordinator Pager [phone] (8a-5p)

## 2018-06-08 NOTE — Progress Notes (Signed)
Pharmacy Electrolyte Monitoring Consult:  Pharmacy consulted to assist in monitoring and replacing electrolytes in this 53 y.o. female admitted on 06/05/2018 with DKA.  Labs:  Sodium (mmol/L)  Date Value  06/08/2018 141  04/04/2018 138   Potassium (mmol/L)  Date Value  06/08/2018 2.9 (L)   Magnesium (mg/dL)  Date Value  81/27/5170 2.2   Phosphorus (mg/dL)  Date Value  01/74/9449 3.5   Calcium (mg/dL)  Date Value  67/59/1638 7.7 (L)   Albumin (g/dL)  Date Value  46/65/9935 3.9    Assessment/Plan: 5/7: Patient transitioned off of insulin drip.  Patient received potassium IV Q1hr x 4.  No further replacement warranted.  Will replace to keep electrolyte within normal limits.  Will obtain electrolytes with am labs.   5/7 addendum: KCL 3.3 at 0413.  Patient received 2 of the 4 doses of KCL 10 meq IV doses after the 0413 am lab. MD has ordered Magnesium level in am. Will order KCL 20 meq x 1. F/u labs in am  5/8 Magnesium WNL. Potassium continues to remain low.  Will replace potassium with 10 mEq x4. Will recheck potassium 1-hr after last dose, per protocol.  Phosphorus levels have not been check. Will obtain labs.  Will check magnesium level with AM labs.   Pharmacy will continue to monitor and adjust per consult.   Cephus Shelling, PharmD

## 2018-06-08 NOTE — Discharge Summary (Addendum)
[facility] [place] at [facility]   PATIENT NAME: Lori Carr    MR#:  [id number]  DATE OF BIRTH:  1965-04-22  DATE OF ADMISSION:  06/05/2018 ADMITTING PHYSICIAN: Oralia Manis, MD  DATE OF DISCHARGE:  06/08/18   PRIMARY CARE PHYSICIAN: Gilles Chiquito, MD    ADMISSION DIAGNOSIS:  SOB (shortness of breath) [R06.02] Type 1 diabetes mellitus with ketoacidosis without coma (HCC) [E10.10]  DISCHARGE DIAGNOSIS:  Principal Problem:   DKA (diabetic ketoacidoses) (HCC) Active Problems:   AKI (acute kidney injury) (HCC)   SECONDARY DIAGNOSIS:   Past Medical History:  Diagnosis Date  . Diabetes mellitus   . Obesity     HOSPITAL COURSE:  HPI  Lori Carr  is a 53 y.o. female who presents with chief complaint as above.  Patient presents the ED for complaint of shortness of breath.  She is found to be tachypneic on initial evaluation.  Lab work shows that she is in DKA.  Patient states that she has had diabetes since she was 53 years old but never before had DKA.  She denies any other strong infectious etiology leading up to this.  She presented with clue small breathing and has significant metabolic abnormalities.  Hospitalist called for admission  #DKA-could be from the new medicine xigduo/metformin Insulin drip continued, aggressive hydration with IV fluids provided Anion gap closed and DKA resolved Stop the new medicineXigduo XR 10-998 mg q AM (SBGT-2 and metformin combination as it is precipitating DKA  COVID test negative At home she takes Lantus 28 units daily and currently discharging home with 26 units daily NovoLog  meal coverage 4 units 3 times daily with meals Freestyle libre sensor given to frequently monitor blood sugars Diabetic coordinator recommendations appreciated Recommended outpatient follow-up with endocrinology  #Acute kidney injury secondary to dehydration from DKA Resolved Aggressive hydration with IV fluids  provided  #Hypokalemia and hypophosphatemia  repleted with K-Phos 30 mmol.  Patient prefers going home, refusing further blood work discharge home with K-Phos, patient is to follow-up with her primary care physician in 3 to 4 days and check her electrolytes  #Chronic vaginal bleeding Patient has been following with gynecology continue follow-up and monitor's hemoglobin  #Leukocytosis-probably reactive  Resolved White blood cell count 32.7--16.3 - nml blood cultures are negative, urine cultures negative.  MRSA PCR positive Chest x-ray negative empiric antibiotics cefepime and vancomycin which are discontinued COVID negative  #Sinus tachycardia-could be from dehydration Hydrated with IV fluids improved and back to normal sinus rhythm Small dose metoprolol added to the regimen will titrate dose as needed    #Obesity Scheduled to get gastric sleeve this Friday.  Patient is canceling the procedure for now   DISCHARGE CONDITIONS:   Stable   CONSULTS OBTAINED:     PROCEDURES none  DRUG ALLERGIES:   Allergies  Allergen Reactions  . Amoxicillin-Pot Clavulanate Itching    Did it involve swelling of the face/tongue/throat, SOB, or low BP? No Did it involve sudden or severe rash/hives, skin peeling, or any reaction on the inside of your mouth or nose? No Did you need to seek medical attention at a hospital or doctor's office? No When did it last happen?A long time ago If all above answers are "NO", may proceed with cephalosporin use.  . Vicodin [Hydrocodone-Acetaminophen] Nausea Only    DISCHARGE MEDICATIONS:   Allergies as of 06/08/2018      Reactions   Amoxicillin-pot Clavulanate Itching   Did it involve swelling of the  face/tongue/throat, SOB, or low BP? No Did it involve sudden or severe rash/hives, skin peeling, or any reaction on the inside of your mouth or nose? No Did you need to seek medical attention at a hospital or doctor's office? No When did it  last happen?A long time ago If all above answers are "NO", may proceed with cephalosporin use.   Vicodin [hydrocodone-acetaminophen] Nausea Only      Medication List    STOP taking these medications   Xigduo XR 10-998 MG Tb24 Generic drug:  Dapagliflozin-metFORMIN HCl ER     TAKE these medications   FreeStyle Libre 14 Day Sensor Misc 26 Units by Does not apply route every 14 (fourteen) days.   insulin aspart 100 UNIT/ML injection Commonly known as:  novoLOG Inject 4 Units into the skin 3 (three) times daily with meals. Per sliding scale What changed:  how much to take   insulin glargine 100 UNIT/ML injection Commonly known as:  LANTUS Inject 26 Units into the skin at bedtime.   megestrol 40 MG tablet Commonly known as:  MEGACE Take two tablets three times daily for 3 days, then two tablets twice daily for 3 days, then one tablet twice daily What changed:    how much to take  how to take this  when to take this   metoprolol tartrate 25 MG tablet Commonly known as:  LOPRESSOR Take 0.5 tablets (12.5 mg total) by mouth 2 (two) times daily.   potassium phosphate (monobasic) 500 MG tablet Commonly known as:  K-PHOS ORIGINAL Take 1 tablet (500 mg total) by mouth 4 (four) times daily -  with meals and at bedtime for 20 doses. Start taking on:  Jun 09, 2018        DISCHARGE INSTRUCTIONS:   Follow-up with primary care physician in 3 days Follow-up with endocrinology in 1 week  DIET:  Cardiac diet and Diabetic diet  DISCHARGE CONDITION:  Fair  ACTIVITY:  Activity as tolerated  OXYGEN:  Home Oxygen: No.   Oxygen Delivery: room air  DISCHARGE LOCATION:  home   If you experience worsening of your admission symptoms, develop shortness of breath, life threatening emergency, suicidal or homicidal thoughts you must seek medical attention immediately by calling 911 or calling your MD immediately  if symptoms less severe.  You Must read complete  instructions/literature along with all the possible adverse reactions/side effects for all the Medicines you take and that have been prescribed to you. Take any new Medicines after you have completely understood and accpet all the possible adverse reactions/side effects.   Please note  You were cared for by a hospitalist during your hospital stay. If you have any questions about your discharge medications or the care you received while you were in the hospital after you are discharged, you can call the unit and asked to speak with the hospitalist on call if the hospitalist that took care of you is not available. Once you are discharged, your primary care physician will handle any further medical issues. Please note that NO REFILLS for any discharge medications will be authorized once you are discharged, as it is imperative that you return to your primary care physician (or establish a relationship with a primary care physician if you do not have one) for your aftercare needs so that they can reassess your need for medications and monitor your lab values.     Today  Chief Complaint  Patient presents with  . Shortness of Breath   Patient is  feeling fine no complaints.  No nausea vomiting tolerating diet.  Wants to go home  ROS:  CONSTITUTIONAL: Denies fevers, chills. Denies any fatigue, weakness.  EYES: Denies blurry vision, double vision, eye pain. EARS, NOSE, THROAT: Denies tinnitus, ear pain, hearing loss. RESPIRATORY: Denies cough, wheeze, shortness of breath.  CARDIOVASCULAR: Denies chest pain, palpitations, edema.  GASTROINTESTINAL: Denies nausea, vomiting, diarrhea, abdominal pain. Denies bright red blood per rectum. GENITOURINARY: Denies dysuria, hematuria. ENDOCRINE: Denies nocturia or thyroid problems. HEMATOLOGIC AND LYMPHATIC: Denies easy bruising or bleeding. SKIN: Denies rash or lesion. MUSCULOSKELETAL: Denies pain in neck, back, shoulder, knees, hips or arthritic symptoms.   NEUROLOGIC: Denies paralysis, paresthesias.  PSYCHIATRIC: Denies anxiety or depressive symptoms.   VITAL SIGNS:  Blood pressure 118/71, pulse 95, temperature 98.5 F (36.9 C), resp. rate 16, height  (1.575 m), weight 78.7 kg, last menstrual period 06/04/2018, SpO2 98 %.  I/O:    Intake/Output Summary (Last 24 hours) at 06/08/2018 1450 Last data filed at 06/08/2018 1329 Gross per 24 hour  Intake 720.42 ml  Output -  Net 720.42 ml    PHYSICAL EXAMINATION:  GENERAL:  53 y.o.-year-old patient lying in the bed with no acute distress.  EYES: Pupils equal, round, reactive to light and accommodation. No scleral icterus. Extraocular muscles intact.  HEENT: Head atraumatic, normocephalic. Oropharynx and nasopharynx clear.  NECK:  Supple, no jugular venous distention. No thyroid enlargement, no tenderness.  LUNGS: Normal breath sounds bilaterally, no wheezing, rales,rhonchi or crepitation. No use of accessory muscles of respiration.  CARDIOVASCULAR: S1, S2 normal. No murmurs, rubs, or gallops.  ABDOMEN: Soft, non-tender, non-distended. Bowel sounds present. No organomegaly or mass.  EXTREMITIES: No pedal edema, cyanosis, or clubbing.  NEUROLOGIC: Cranial nerves II through XII are intact. Muscle strength at her baseline. Sensation intact. Gait not checked.  PSYCHIATRIC: The patient is alert and oriented x 3.  SKIN: No obvious rash, lesion, or ulcer.   DATA REVIEW:   CBC Recent Labs  Lab 06/08/18 0344  WBC 7.6  HGB 9.1*  HCT 28.3*  PLT 337    Chemistries  Recent Labs  Lab 06/08/18 0344 06/08/18 1305  NA 141  --   K 2.9* 3.4*  CL 109  --   CO2 23  --   GLUCOSE 216*  --   BUN 10  --   CREATININE 0.54  --   CALCIUM 7.7*  --   MG 2.2  --     Cardiac Enzymes No results for input(s): TROPONINI in the last 168 hours.  Microbiology Results  Results for orders placed or performed during the hospital encounter of 06/05/18  SARS Coronavirus 2 (CEPHEID- Performed in [facility] hospital lab), Hosp Order     Status: None   Collection Time: 06/06/18 12:00 AM  Result Value Ref Range Status   SARS Coronavirus 2 NEGATIVE NEGATIVE Final    Comment: (NOTE) If result is NEGATIVE SARS-CoV-2 target nucleic acids are NOT DETECTED. The SARS-CoV-2 RNA is generally detectable in upper and lower  respiratory specimens during the acute phase of infection. The lowest  concentration of SARS-CoV-2 viral copies this assay can detect is 250  copies / mL. A negative result does not preclude SARS-CoV-2 infection  and should not be used as the sole basis for treatment or other  patient management decisions.  A negative result may occur with  improper specimen collection / handling, submission of specimen other  than nasopharyngeal swab, presence of viral mutation(s) within the  areas targeted  by this assay, and inadequate number of viral copies  (<250 copies / mL). A negative result must be combined with clinical  observations, patient history, and epidemiological information. If result is POSITIVE SARS-CoV-2 target nucleic acids are DETECTED. The SARS-CoV-2 RNA is generally detectable in upper and lower  respiratory specimens dur ing the acute phase of infection.  Positive  results are indicative of active infection with SARS-CoV-2.  Clinical  correlation with patient history and other diagnostic information is  necessary to determine patient infection status.  Positive results do  not rule out bacterial infection or co-infection with other viruses. If result is PRESUMPTIVE POSTIVE SARS-CoV-2 nucleic acids MAY BE PRESENT.   A presumptive positive result was obtained on the submitted specimen  and confirmed on repeat testing.  While 2019 novel coronavirus  (SARS-CoV-2) nucleic acids may be present in the submitted sample  additional confirmatory testing may be necessary for epidemiological  and / or clinical management purposes  to differentiate between  SARS-CoV-2 and  other Sarbecovirus currently known to infect humans.  If clinically indicated additional testing with an alternate test  methodology [phone]) is advised. The SARS-CoV-2 RNA is generally  detectable in upper and lower respiratory sp ecimens during the acute  phase of infection. The expected result is Negative. Fact Sheet for Patients:  BoilerBrush.com.cyhttps://www.fda.gov/media/136312/download Fact Sheet for Healthcare Providers: https://pope.com/https://www.fda.gov/media/136313/download This test is not yet approved or cleared by the [place] FDA and has been authorized for detection and/or diagnosis of SARS-CoV-2 by FDA under an Emergency Use Authorization (EUA).  This EUA will remain in effect (meaning this test can be used) for the duration of the COVID-19 declaration under Section 564(b)(1) of the Act, 21 U.S.C. section 360bbb-3(b)(1), unless the authorization is terminated or revoked sooner. Performed at [facility], [address] [place], [place] [address]   Culture, Urine     Status: None   Collection Time: 06/06/18  1:16 AM  Result Value Ref Range Status   Specimen Description   Final    URINE, RANDOM Performed at [facility], [address] [place], [place] [address]    Special Requests   Final    NONE Performed at [facility], [address] [place], [place] [address]    Culture   Final    NO GROWTH Performed at [facility], [address] [place], [place] [address]    Report Status 06/07/2018 FINAL  Final  Blood Culture (routine x 2)     Status: None (Preliminary result)   Collection Time: 06/06/18  1:26 AM  Result Value Ref Range Status   Specimen Description BLOOD LEFT ANTECUBITAL  Final   Special Requests   Final    BOTTLES DRAWN AEROBIC AND ANAEROBIC Blood Culture results may not be optimal due to an excessive volume of blood received in culture bottles   Culture  Setup Time PENDING  Incomplete   Culture   Final    NO GROWTH 2  DAYS Performed at [facility], [address] [place], [place] [address]    Report Status PENDING  Incomplete  Blood Culture (routine x 2)     Status: None (Preliminary result)   Collection Time: 06/06/18  2:13 AM  Result Value Ref Range Status   Specimen Description BLOOD BLOOD LEFT HAND  Final   Special Requests   Final    BOTTLES DRAWN AEROBIC AND ANAEROBIC Blood Culture results may not be optimal due to an inadequate volume of blood  received in culture bottles   Culture   Final    NO GROWTH 2 DAYS Performed at [facility], [address] [place], [place] [address]    Report Status PENDING  Incomplete  MRSA PCR Screening     Status: Abnormal   Collection Time: 06/06/18  3:42 AM  Result Value Ref Range Status   MRSA by PCR POSITIVE (A) NEGATIVE Final    Comment:        The GeneXpert MRSA Assay (FDA approved for NASAL specimens only), is one component of a comprehensive MRSA colonization surveillance program. It is not intended to diagnose MRSA infection nor to guide or monitor treatment for MRSA infections. RESULT CALLED TO, READ BACK BY AND VERIFIED WITH:  CHRISTY ADELOWO AT 9326 06/06/2018 SDR Performed at [facility], [address] [place], [place] [address]     RADIOLOGY:  Ct Abdomen Pelvis Wo Contrast  Result Date: 06/06/2018 CLINICAL DATA:  Sepsis. Inpatient. Severe diabetic ketoacidosis. Urinalysis concerning for UTI. EXAM: CT ABDOMEN AND PELVIS WITHOUT CONTRAST TECHNIQUE: Multidetector CT imaging of the abdomen and pelvis was performed following the standard protocol without IV contrast. COMPARISON:  Chest CT angiogram from earlier today. FINDINGS: Lower chest: There are patchy ground-glass opacities throughout the periphery of both lung bases. There is evidence of air trapping within a pulmonary sequestration at the posteromedial right lung base with probable arterial supply arising from celiac trunk as seen on chest CT angiogram from  earlier today. These findings were obscured by severe respiratory motion artifact on the chest CT angiogram performed earlier today. Hepatobiliary: Normal liver size. No liver mass. Normal gallbladder with no radiopaque cholelithiasis. No biliary ductal dilatation. Pancreas: Normal, with no mass or duct dilation. Spleen: Normal size. No mass. Adrenals/Urinary Tract: Normal adrenals. There are striated contrast nephrograms in both kidneys on this noncontrast study. Excreted contrast is seen in the renal collecting systems, ureters and bladder. No hydronephrosis. No contour deforming renal masses. Normal bladder. Stomach/Bowel: Normal non-distended stomach. Normal caliber small bowel with no small bowel wall thickening. Normal appendix. Normal large bowel with no diverticulosis, large bowel wall thickening or pericolonic fat stranding. Vascular/Lymphatic: Normal caliber abdominal aorta. No pathologically enlarged lymph nodes in the abdomen or pelvis. Reproductive: Mildly enlarged globular left deviated anteverted uterus. No adnexal masses. Other: No pneumoperitoneum, ascites or focal fluid collection. Musculoskeletal: No aggressive appearing focal osseous lesions. Moderate thoracolumbar spondylosis, most prominent at L5-S1. IMPRESSION: 1. Patchy ground-glass opacity throughout the periphery of both lung bases (which was obscured by motion artifact on the chest CT angiogram study performed earlier today). This finding is presumably inflammatory, with atypical infection including viral pneumonia on the differential. Note is made of a negative result on SARS coronavirus 2 swab from earlier today. 2. 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 3. Serrated contrast nephrograms in both kidneys on this noncontrast study, which may indicate a degree of acute renal failure. No hydronephrosis. Recommend serial serum creatinine correlation. 4. Otherwise no acute abnormality in the  abdomen or pelvis. No fluid collections. Electronically Signed   By: Delbert Phenix M.D.   On: 06/06/2018 12:27   Ct Angio Chest Pe W And/or Wo Contrast  Result Date: 06/06/2018 CLINICAL DATA:  Chest pain EXAM: CT ANGIOGRAPHY CHEST WITH CONTRAST TECHNIQUE: Multidetector CT imaging of the chest was performed using the standard protocol during bolus administration of intravenous contrast. Multiplanar CT image reconstructions and MIPs were obtained to evaluate the vascular anatomy. CONTRAST:  75mL OMNIPAQUE IOHEXOL 350 MG/ML SOLN COMPARISON:  Chest x-ray 06/05/2018 FINDINGS: Cardiovascular: Respiratory motion obscures the vessels in the lower lobes. No visualized large or central pulmonary emboli. Heart is normal size. Aorta is normal caliber. Mediastinum/Nodes: No mediastinal, hilar, or axillary adenopathy. Lungs/Pleura: Lungs are clear. No focal airspace opacities or suspicious nodules. No effusions. Calcified granuloma in the left upper lobe. Upper Abdomen: Imaging into the upper abdomen shows no acute findings. Musculoskeletal: Chest wall soft tissues are unremarkable. No acute bony abnormality. Review of the MIP images confirms the above findings. IMPRESSION: No evidence of large or central pulmonary emboli. The lower lobe pulmonary arterial branches are obscured by respiratory motion. Old granulomatous disease. No visualized acute cardiopulmonary disease. Electronically Signed   By: Charlett Nose M.D.   On: 06/06/2018 03:43   US Venous Img Lower Bilateral  Result Date: 06/06/2018 CLINICAL DATA:  Lower extremity edema, pain EXAM: BILATERAL LOWER EXTREMITY VENOUS DOPPLER ULTRASOUND TECHNIQUE: Gray-scale sonography with compression, as well as color and duplex ultrasound, were performed to evaluate the deep venous system from the level of the common femoral vein through the popliteal and proximal calf veins. COMPARISON:  None FINDINGS: Normal compressibility of the common femoral, superficial femoral, and  popliteal veins, as well as the proximal calf veins. No filling defects to suggest DVT on grayscale or color Doppler imaging. Doppler waveforms show normal direction of venous flow, normal respiratory phasicity and response to augmentation. Visualized segments of the saphenous venous systems normal in caliber and compressibility. IMPRESSION: No femoropopliteal and no calf DVT in the visualized calf veins. If clinical symptoms are inconsistent or if there are persistent or worsening symptoms, further imaging (possibly involving the iliac veins) may be warranted. Electronically Signed   By: Corlis Leak M.D.   On: 06/06/2018 12:53   Dg Chest Port 1 View  Result Date: 06/05/2018 CLINICAL DATA:  Shortness of breath EXAM: PORTABLE CHEST 1 VIEW COMPARISON:  None. FINDINGS: Heart and mediastinal contours are within normal limits. No focal opacities or effusions. No acute bony abnormality. IMPRESSION: No active disease. Electronically Signed   By: Charlett Nose M.D.   On: 06/05/2018 23:19    EKG:   Orders placed or performed during the hospital encounter of 06/05/18  . EKG 12-Lead  . EKG 12-Lead  . ED EKG  . ED EKG      Management plans discussed with the patient, she is  in agreement.  CODE STATUS:     Code Status Orders  (From admission, onward)         Start     Ordered   06/06/18 0405  Full code  Continuous     06/06/18 0404        Code Status History    This patient has a current code status but no historical code status.    Advance Directive Documentation     Most Recent Value  Type of Advance Directive  Living will, Healthcare Power of Attorney  Pre-existing out of facility DNR order (yellow form or pink MOST form)  -  "MOST" Form in Place?  -      TOTAL TIME TAKING CARE OF THIS PATIENT: 45  minutes.   Note: This dictation was prepared with Dragon dictation along with smaller phrase technology. Any transcriptional errors that result from this process are  unintentional.   @  on 06/08/2018 at 2:50 PM  Between 7am to 6pm - Pager - [phone]  After 6pm go to [email or web address] - password [email or web address]  [facility]  [facility]  Office  [phone]  CC: Primary care physician; Gilles Chiquito, MD

## 2018-06-08 NOTE — Discharge Instructions (Signed)
Follow-up with primary care physician in 3 days Follow-up with endocrinology in 1 week

## 2018-06-08 NOTE — Progress Notes (Signed)
Pharmacy Electrolyte Monitoring Consult:  Pharmacy consulted to assist in monitoring and replacing electrolytes in this 53 y.o. female admitted on 06/05/2018 with DKA.  Labs:  Sodium (mmol/L)  Date Value  06/08/2018 141  04/04/2018 138   Potassium (mmol/L)  Date Value  06/08/2018 3.4 (L)   Magnesium (mg/dL)  Date Value  50/51/8335 2.2   Phosphorus (mg/dL)  Date Value  82/51/8984 <1.0 (LL)   Calcium (mg/dL)  Date Value  21/04/1279 7.7 (L)   Albumin (g/dL)  Date Value  18/86/7737 3.9    Assessment/Plan: 5/7: Patient transitioned off of insulin drip.  Patient received potassium IV Q1hr x 4.  No further replacement warranted.  Will replace to keep electrolyte within normal limits.  Will obtain electrolytes with am labs.   5/7 addendum: KCL 3.3 at 0413.  Patient received 2 of the 4 doses of KCL 10 meq IV doses after the 0413 am lab. MD has ordered Magnesium level in am. Will order KCL 20 meq x 1. F/u labs in am  5/8 Magnesium WNL. Potassium continues to remain low.  Will replace potassium with 10 mEq x4. Will recheck potassium 1-hr after last dose, per protocol.  Phosphorus levels have not been check. Will obtain labs.  Will check magnesium level with AM labs.   5/8 @1500 :  Also, in addition to what was previously ordered patient received 40 meq x1 KCL PO by Dr. Amado Coe. K 3.4 and phosphorus <1.0. Potassium Phos 30 mmol x1 and potassium phos 500 mg TID was ordered..  Will recheck phosphorus level @ 2200. Will check K+ and Mg with AM labs.  Pharmacy will continue to monitor and adjust per consult.   Cephus Shelling, PharmD

## 2018-06-08 NOTE — Progress Notes (Signed)
Dr Amado Coe aware that lab called critical phosphorus <1.0

## 2018-06-10 LAB — CULTURE, BLOOD (ROUTINE X 2): Culture: NO GROWTH

## 2018-06-11 ENCOUNTER — Other Ambulatory Visit: Payer: Self-pay

## 2018-06-11 LAB — CULTURE, BLOOD (ROUTINE X 2): Culture: NO GROWTH

## 2018-06-11 LAB — BLOOD GAS, VENOUS
Acid-base deficit: 27.5 mmol/L — ABNORMAL HIGH (ref 0.0–2.0)
Bicarbonate: 3.1 mmol/L — ABNORMAL LOW (ref 20.0–28.0)
O2 Saturation: 63.5 %
Patient temperature: 37
pH, Ven: 6.95 — CL (ref 7.250–7.430)
pO2, Ven: 55 mmHg — ABNORMAL HIGH (ref 32.0–45.0)

## 2018-06-12 LAB — BASIC METABOLIC PANEL
BUN/Creatinine Ratio: 15 (ref 9–23)
BUN: 11 mg/dL (ref 6–24)
CO2: 23 mmol/L (ref 20–29)
Calcium: 9.6 mg/dL (ref 8.7–10.2)
Chloride: 103 mmol/L (ref 96–106)
Creatinine, Ser: 0.71 mg/dL (ref 0.57–1.00)
GFR calc Af Amer: 113 mL/min/{1.73_m2} (ref >59)
GFR calc non Af Amer: 98 mL/min/{1.73_m2} (ref >59)
Glucose: 204 mg/dL — ABNORMAL HIGH (ref 65–99)
Potassium: 4.6 mmol/L (ref 3.5–5.2)
Sodium: 139 mmol/L (ref 134–144)

## 2018-06-12 LAB — PHOSPHORUS: Phosphorus: 3.8 mg/dL (ref 3.0–4.3)

## 2018-06-14 ENCOUNTER — Other Ambulatory Visit: Payer: Self-pay | Admitting: Obstetrics and Gynecology

## 2018-06-28 ENCOUNTER — Other Ambulatory Visit: Payer: Self-pay

## 2018-06-28 ENCOUNTER — Encounter (HOSPITAL_BASED_OUTPATIENT_CLINIC_OR_DEPARTMENT_OTHER): Payer: Self-pay | Admitting: *Deleted

## 2018-06-28 NOTE — Progress Notes (Addendum)
Spoke with Lori Carr  Npo after midnight, arrive 800 an 07-03-18 [facility] Take 1/2 dose lantus insulin hs 10-02-18 06-11-18 ekg/chestxray epic/on chart Needs cbc, bmet urine poct day of surgery covid test scheduled 325 pm 06-29-18 Has surgery orders in epic Driver friend sue ruth cell [phone]

## 2018-06-29 ENCOUNTER — Other Ambulatory Visit (HOSPITAL_COMMUNITY)
Admission: RE | Admit: 2018-06-29 | Discharge: 2018-06-29 | Disposition: A | Discharge status: Home / Self Care | Payer: BC Managed Care – PPO | Source: Ambulatory Visit | Attending: Obstetrics and Gynecology | Admitting: Obstetrics and Gynecology

## 2018-06-29 DIAGNOSIS — Z1159 Encounter for screening for other viral diseases: Secondary | ICD-10-CM | POA: Insufficient documentation

## 2018-06-30 LAB — NOVEL CORONAVIRUS, NAA (HOSP ORDER, SEND-OUT TO REF LAB; TAT 18-24 HRS): SARS-CoV-2, NAA: NOT DETECTED

## 2018-07-02 NOTE — Progress Notes (Signed)
SPOKE W/  Patient- Lori Carr     SCREENING SYMPTOMS OF COVID 19:   COUGH--No  RUNNY NOSE---No   SORE THROAT---No  NASAL CONGESTION----No  SNEEZING----No  SHORTNESS OF BREATH---No  DIFFICULTY BREATHING---No  TEMP >100.0 -----No  UNEXPLAINED BODY ACHES------No  CHILLS -------- No  HEADACHES ---------No  LOSS OF SMELL/ TASTE --------No    HAVE YOU OR ANY FAMILY MEMBER TRAVELLED PAST 14 DAYS OUT OF THE   COUNTY---No STATE----No COUNTRY----No  HAVE YOU OR ANY FAMILY MEMBER BEEN EXPOSED TO ANYONE WITH COVID 19? No   Informed patient of negative COVID test results. Completed screening.  Frances Furbish, RN

## 2018-07-03 ENCOUNTER — Encounter (HOSPITAL_BASED_OUTPATIENT_CLINIC_OR_DEPARTMENT_OTHER)
Admission: RE | Disposition: A | Discharge status: Home / Self Care | Payer: Self-pay | Source: Home / Self Care | Attending: Obstetrics and Gynecology

## 2018-07-03 ENCOUNTER — Ambulatory Visit (HOSPITAL_BASED_OUTPATIENT_CLINIC_OR_DEPARTMENT_OTHER): Payer: BC Managed Care – PPO | Admitting: Anesthesiology

## 2018-07-03 ENCOUNTER — Ambulatory Visit (HOSPITAL_BASED_OUTPATIENT_CLINIC_OR_DEPARTMENT_OTHER)
Admission: RE | Admit: 2018-07-03 | Discharge: 2018-07-03 | Disposition: A | Discharge status: Home / Self Care | Payer: BC Managed Care – PPO | Attending: Obstetrics and Gynecology | Admitting: Obstetrics and Gynecology

## 2018-07-03 ENCOUNTER — Other Ambulatory Visit: Payer: Self-pay

## 2018-07-03 ENCOUNTER — Encounter (HOSPITAL_BASED_OUTPATIENT_CLINIC_OR_DEPARTMENT_OTHER): Payer: Self-pay | Admitting: *Deleted

## 2018-07-03 DIAGNOSIS — Z880 Allergy status to penicillin: Secondary | ICD-10-CM | POA: Insufficient documentation

## 2018-07-03 DIAGNOSIS — N938 Other specified abnormal uterine and vaginal bleeding: Secondary | ICD-10-CM | POA: Diagnosis not present

## 2018-07-03 DIAGNOSIS — N950 Postmenopausal bleeding: Secondary | ICD-10-CM | POA: Diagnosis not present

## 2018-07-03 DIAGNOSIS — Z794 Long term (current) use of insulin: Secondary | ICD-10-CM | POA: Insufficient documentation

## 2018-07-03 DIAGNOSIS — E119 Type 2 diabetes mellitus without complications: Secondary | ICD-10-CM | POA: Insufficient documentation

## 2018-07-03 DIAGNOSIS — N921 Excessive and frequent menstruation with irregular cycle: Secondary | ICD-10-CM | POA: Insufficient documentation

## 2018-07-03 DIAGNOSIS — N939 Abnormal uterine and vaginal bleeding, unspecified: Secondary | ICD-10-CM | POA: Insufficient documentation

## 2018-07-03 DIAGNOSIS — Z885 Allergy status to narcotic agent status: Secondary | ICD-10-CM | POA: Diagnosis not present

## 2018-07-03 DIAGNOSIS — N179 Acute kidney failure, unspecified: Secondary | ICD-10-CM | POA: Diagnosis not present

## 2018-07-03 DIAGNOSIS — Z6833 Body mass index (BMI) 33.0-33.9, adult: Secondary | ICD-10-CM | POA: Diagnosis not present

## 2018-07-03 DIAGNOSIS — E1110 Type 2 diabetes mellitus with ketoacidosis without coma: Secondary | ICD-10-CM | POA: Diagnosis not present

## 2018-07-03 DIAGNOSIS — N840 Polyp of corpus uteri: Secondary | ICD-10-CM | POA: Diagnosis not present

## 2018-07-03 DIAGNOSIS — E669 Obesity, unspecified: Secondary | ICD-10-CM | POA: Insufficient documentation

## 2018-07-03 HISTORY — PX: DILITATION & CURRETTAGE/HYSTROSCOPY WITH NOVASURE ABLATION: SHX5568

## 2018-07-03 LAB — CBC
HCT: 33.5 % — ABNORMAL LOW (ref 36.0–46.0)
Hemoglobin: 9.8 g/dL — ABNORMAL LOW (ref 12.0–15.0)
MCH: 23.4 pg — ABNORMAL LOW (ref 26.0–34.0)
MCHC: 29.3 g/dL — ABNORMAL LOW (ref 30.0–36.0)
MCV: 80.1 fL (ref 80.0–100.0)
Platelets: 402 10*3/uL — ABNORMAL HIGH (ref 150–400)
RBC: 4.18 MIL/uL (ref 3.87–5.11)
RDW: 14.1 % (ref 11.5–15.5)
WBC: 6.2 10*3/uL (ref 4.0–10.5)
nRBC: 0 % (ref 0.0–0.2)

## 2018-07-03 LAB — BASIC METABOLIC PANEL
Anion gap: 10 (ref 5–15)
BUN: 9 mg/dL (ref 6–20)
CO2: 23 mmol/L (ref 22–32)
Calcium: 8.9 mg/dL (ref 8.9–10.3)
Chloride: 102 mmol/L (ref 98–111)
Creatinine, Ser: 0.81 mg/dL (ref 0.44–1.00)
GFR calc Af Amer: >60 mL/min (ref >60)
GFR calc non Af Amer: >60 mL/min (ref >60)
Glucose, Bld: 284 mg/dL — ABNORMAL HIGH (ref 70–99)
Potassium: 4.1 mmol/L (ref 3.5–5.1)
Sodium: 135 mmol/L (ref 135–145)

## 2018-07-03 LAB — GLUCOSE, CAPILLARY
Glucose-Capillary: 284 mg/dL — ABNORMAL HIGH (ref 70–99)
Glucose-Capillary: 156 mg/dL — ABNORMAL HIGH (ref 70–99)
Glucose-Capillary: 148 mg/dL — ABNORMAL HIGH (ref 70–99)

## 2018-07-03 LAB — POCT PREGNANCY, URINE: Preg Test, Ur: NEGATIVE

## 2018-07-03 SURGERY — DILATATION & CURETTAGE/HYSTEROSCOPY WITH NOVASURE ABLATION
Anesthesia: General

## 2018-07-03 MED ORDER — PROPOFOL 10 MG/ML IV BOLUS
INTRAVENOUS | Status: DC | PRN
  Administered 2018-07-03: 170 mg via INTRAVENOUS

## 2018-07-03 MED ORDER — ACETAMINOPHEN 500 MG PO TABS
1000.0000 mg | ORAL_TABLET | Freq: Once | ORAL | Status: AC
  Administered 2018-07-03: 1000 mg via ORAL
  Filled 2018-07-03: qty 2

## 2018-07-03 MED ORDER — LACTATED RINGERS IV SOLN
INTRAVENOUS | Status: DC
  Administered 2018-07-03 (×2): via INTRAVENOUS
  Filled 2018-07-03: qty 1000

## 2018-07-03 MED ORDER — SODIUM CHLORIDE 0.9 % IR SOLN
Status: DC | PRN
  Administered 2018-07-03: 1

## 2018-07-03 MED ORDER — ONDANSETRON HCL 4 MG/2ML IJ SOLN
INTRAMUSCULAR | Status: AC
  Filled 2018-07-03: qty 2

## 2018-07-03 MED ORDER — BUPIVACAINE HCL (PF) 0.25 % IJ SOLN
INTRAMUSCULAR | Status: DC | PRN
  Administered 2018-07-03: 20 mL

## 2018-07-03 MED ORDER — DEXAMETHASONE SODIUM PHOSPHATE 10 MG/ML IJ SOLN
INTRAMUSCULAR | Status: DC | PRN
  Administered 2018-07-03: 5 mg via INTRAVENOUS

## 2018-07-03 MED ORDER — KETOROLAC TROMETHAMINE 30 MG/ML IJ SOLN
INTRAMUSCULAR | Status: AC
  Filled 2018-07-03: qty 1

## 2018-07-03 MED ORDER — CEFAZOLIN SODIUM-DEXTROSE 2-4 GM/100ML-% IV SOLN
2.0000 g | INTRAVENOUS | Status: AC
  Administered 2018-07-03: 2 g via INTRAVENOUS
  Filled 2018-07-03: qty 100

## 2018-07-03 MED ORDER — KETOROLAC TROMETHAMINE 30 MG/ML IJ SOLN
INTRAMUSCULAR | Status: DC | PRN
  Administered 2018-07-03: 30 mg via INTRAVENOUS

## 2018-07-03 MED ORDER — INSULIN ASPART 100 UNIT/ML ~~LOC~~ SOLN
SUBCUTANEOUS | Status: AC
  Filled 2018-07-03: qty 1

## 2018-07-03 MED ORDER — VASOPRESSIN 20 UNIT/ML IV SOLN
INTRAVENOUS | Status: DC | PRN
  Administered 2018-07-03: 18 mL via INTRAMUSCULAR

## 2018-07-03 MED ORDER — ACETAMINOPHEN 500 MG PO TABS
ORAL_TABLET | ORAL | Status: AC
  Filled 2018-07-03: qty 2

## 2018-07-03 MED ORDER — ONDANSETRON HCL 4 MG/2ML IJ SOLN
INTRAMUSCULAR | Status: DC | PRN
  Administered 2018-07-03: 4 mg via INTRAVENOUS

## 2018-07-03 MED ORDER — FENTANYL CITRATE (PF) 100 MCG/2ML IJ SOLN
INTRAMUSCULAR | Status: DC | PRN
  Administered 2018-07-03: 100 ug via INTRAVENOUS

## 2018-07-03 MED ORDER — TRAMADOL HCL 50 MG PO TABS: 50.0000 mg | ORAL_TABLET | Freq: Four times a day (QID) | ORAL | 0 refills | Status: DC | PRN

## 2018-07-03 MED ORDER — DEXAMETHASONE SODIUM PHOSPHATE 10 MG/ML IJ SOLN
INTRAMUSCULAR | Status: AC
  Filled 2018-07-03: qty 1

## 2018-07-03 MED ORDER — MIDAZOLAM HCL 2 MG/2ML IJ SOLN
INTRAMUSCULAR | Status: AC
  Filled 2018-07-03: qty 2

## 2018-07-03 MED ORDER — CEFAZOLIN SODIUM-DEXTROSE 2-4 GM/100ML-% IV SOLN
INTRAVENOUS | Status: AC
  Filled 2018-07-03: qty 100

## 2018-07-03 MED ORDER — MIDAZOLAM HCL 2 MG/2ML IJ SOLN
INTRAMUSCULAR | Status: DC | PRN
  Administered 2018-07-03: 2 mg via INTRAVENOUS

## 2018-07-03 MED ORDER — INSULIN ASPART 100 UNIT/ML ~~LOC~~ SOLN
6.0000 [IU] | Freq: Once | SUBCUTANEOUS | Status: AC
  Administered 2018-07-03: 6 [IU] via SUBCUTANEOUS
  Filled 2018-07-03: qty 0.06

## 2018-07-03 MED ORDER — FENTANYL CITRATE (PF) 100 MCG/2ML IJ SOLN
INTRAMUSCULAR | Status: AC
  Filled 2018-07-03: qty 2

## 2018-07-03 MED ORDER — PROPOFOL 10 MG/ML IV BOLUS
INTRAVENOUS | Status: AC
  Filled 2018-07-03: qty 20

## 2018-07-03 MED ORDER — LIDOCAINE 2% (20 MG/ML) 5 ML SYRINGE
INTRAMUSCULAR | Status: DC | PRN
  Administered 2018-07-03: 100 mg via INTRAVENOUS

## 2018-07-03 MED ORDER — FENTANYL CITRATE (PF) 100 MCG/2ML IJ SOLN
25.0000 ug | INTRAMUSCULAR | Status: DC | PRN
  Filled 2018-07-03: qty 1

## 2018-07-03 MED ORDER — LIDOCAINE 2% (20 MG/ML) 5 ML SYRINGE
INTRAMUSCULAR | Status: AC
  Filled 2018-07-03: qty 5

## 2018-07-03 SURGICAL SUPPLY — 14 items
ABLATOR SURESOUND NOVASURE (ABLATOR) ×2 IMPLANT
CATH ROBINSON RED A/P 16FR (CATHETERS) ×2 IMPLANT
GLOVE BIO SURGEON STRL SZ7.5 (GLOVE) ×2 IMPLANT
GLOVE BIOGEL PI IND STRL 7.0 (GLOVE) ×1 IMPLANT
GLOVE BIOGEL PI INDICATOR 7.0 (GLOVE) ×1
GOWN STRL REUS W/ TWL LRG LVL3 (GOWN DISPOSABLE) ×2 IMPLANT
GOWN STRL REUS W/TWL LRG LVL3 (GOWN DISPOSABLE) ×4
HIBICLENS CHG 4% 4OZ BTL (MISCELLANEOUS) ×1 IMPLANT
KIT PROCEDURE FLUENT (KITS) ×2 IMPLANT
PACK VAGINAL MINOR WOMEN LF (CUSTOM PROCEDURE TRAY) ×2 IMPLANT
PAD OB MATERNITY 4.3X12.25 (PERSONAL CARE ITEMS) ×2 IMPLANT
PAD PREP 24X48 CUFFED NSTRL (MISCELLANEOUS) ×2 IMPLANT
SYRINGE 1CC 25X5/8 TB ECLIPSE (MISCELLANEOUS) ×1 IMPLANT
TOWEL OR 17X24 6PK STRL BLUE (TOWEL DISPOSABLE) ×4 IMPLANT

## 2018-07-03 NOTE — Anesthesia Postprocedure Evaluation (Signed)
Anesthesia Post Note  Patient: Lori Carr  Procedure(s) Performed: DILATATION & CURETTAGE/HYSTEROSCOPY WITH Possible NOVASURE ABLATION (N/A )     Patient location during evaluation: PACU Anesthesia Type: General Level of consciousness: awake and alert Pain management: pain level controlled Vital Signs Assessment: post-procedure vital signs reviewed and stable Respiratory status: spontaneous breathing, nonlabored ventilation, respiratory function stable and patient connected to nasal cannula oxygen Cardiovascular status: blood pressure returned to baseline and stable Postop Assessment: no apparent nausea or vomiting Anesthetic complications: no    Last Vitals:  Vitals:   07/03/18 1100 07/03/18 1135  BP: 114/68 111/63  Pulse: 79 68  Resp: 12 18  Temp:  36.6 C  SpO2: 99% 98%    Last Pain:  Vitals:   07/03/18 1135  TempSrc:   PainSc: 0-No pain                 Octavion Mollenkopf L Quandarius Nill

## 2018-07-03 NOTE — H&P (Signed)
Lori Carr is an 53 y.o. Carr. AUB for Diag HS possible Novasure.   Pertinent Gynecological History: Menses: flow is moderate Bleeding: dysfunctional uterine bleeding Contraception: none DES exposure: denies Blood transfusions: none Sexually transmitted diseases: no past history Previous GYN Procedures: DNC  Last mammogram: normal Date: 2020 Last pap: normal Date: 2020 OB History: G2, P2   Menstrual History: Menarche age: [age] Patient's last menstrual period was 06/04/2018.    Past Medical History:  Diagnosis Date  . Diabetes mellitus    type 2  . Obesity     Past Surgical History:  Procedure Laterality Date  . CESAREAN SECTION     x3  . tissue removed from stomach  as child  . TYMPANOSTOMY TUBE PLACEMENT  as child    Family History  Problem Relation Age of Onset  . Cancer Mother [id number]       deceased age 52 stage IV ovarian cancer  . Breast cancer Maternal Aunt 50  . Breast cancer Paternal Grandmother [id number]  . Breast cancer Paternal Aunt 260  . Stroke Brother     Social History:  reports that she has never smoked. She has never used smokeless tobacco. She reports that she does not drink alcohol or use drugs.  Allergies:  Allergies  Allergen Reactions  . Amoxicillin-Pot Clavulanate Itching    Did it involve swelling of the face/tongue/throat, SOB, or low BP? No Did it involve sudden or severe rash/hives, skin peeling, or any reaction on the inside of your mouth or nose? No Did you need to seek medical attention at a hospital or doctor's office? No When did it last happen?A long time ago If all above answers are "NO", may proceed with cephalosporin use.  . Vicodin [Hydrocodone-Acetaminophen] Nausea Only    Medications Prior to Admission  Medication Sig Dispense Refill Last Dose  . insulin glargine (LANTUS) 100 UNIT/ML injection Inject 28 Units into the skin at bedtime.    07/02/2018 at Unknown time  . Continuous Blood Gluc Sensor (FREESTYLE LIBRE 14 DAY  SENSOR) MISC 26 Units by Does not apply route every 14 (fourteen) days. 3 each 3   . insulin aspart (NOVOLOG) 100 UNIT/ML injection Inject 4 Units into the skin 3 (three) times daily with meals. Per sliding scale 10 mL 11     Review of Systems  Constitutional: Negative.   All other systems reviewed and are negative.   Blood pressure (!) 153/93, pulse 90, temperature 98 F (36.7 C), temperature source Oral, resp. rate 16, height 5\' 2"  (1.575 m), weight 82.9 kg, last menstrual period 06/04/2018, SpO2 100 %. Physical Exam  Nursing note and vitals reviewed. Constitutional: She is oriented to person, place, and time. She appears well-developed and well-nourished.  HENT:  Head: Normocephalic.  Neck: Normal range of motion. Neck supple.  Cardiovascular: Normal rate and regular rhythm.  Respiratory: Effort normal and breath sounds normal.  GI: Soft. Bowel sounds are normal.  Genitourinary:    Vagina and uterus normal.   Musculoskeletal: Normal range of motion.  Neurological: She is alert and oriented to person, place, and time. She has normal reflexes.  Skin: Skin is warm and dry.  Psychiatric: She has a normal mood and affect.    Results for orders placed or performed during the hospital encounter of 07/03/18 (from the past 24 hour(s))  CBC     Status: Abnormal   Collection Time: 07/03/18  8:29 AM  Result Value Ref Range   WBC 6.2 4.0 - 10.5 K/uL  RBC 4.18 3.87 - 5.11 MIL/uL   Hemoglobin 9.8 (L) 12.0 - 15.0 g/dL   HCT 56.9 (L) 79.4 - 80.1 %   MCV 80.1 80.0 - 100.0 fL   MCH 23.4 (L) 26.0 - 34.0 pg   MCHC 29.3 (L) 30.0 - 36.0 g/dL   RDW 65.5 37.4 - 82.7 %   Platelets 402 (H) 150 - 400 K/uL   nRBC 0.0 0.0 - 0.2 %  Glucose, capillary     Status: Abnormal   Collection Time: 07/03/18  8:45 AM  Result Value Ref Range   Glucose-Capillary 284 (H) 70 - 99 mg/dL  Pregnancy, urine POC     Status: None   Collection Time: 07/03/18  8:45 AM  Result Value Ref Range   Preg Test, Ur NEGATIVE  NEGATIVE    No results found.  Assessment/Plan: AUB - nl ebx DIag HS, D&C, possible Novasure Surgical consent done.  Lori Carr J 07/03/2018, 8:56 AM

## 2018-07-03 NOTE — Discharge Instructions (Signed)
°  NO ADVIL, ALEVE, MOTRIN, IBUPROFEN  TODAY UNTIL 430 PM  Post Anesthesia Home Care Instructions  Activity: Get plenty of rest for the remainder of the day. A responsible adult should stay with you for 24 hours following the procedure.  For the next 24 hours, DO NOT: -Drive a car -Advertising copywriter -Drink alcoholic beverages -Take any medication unless instructed by your physician -Make any legal decisions or sign important papers.  Meals: Start with liquid foods such as gelatin or soup. Progress to regular foods as tolerated. Avoid greasy, spicy, heavy foods. If nausea and/or vomiting occur, drink only clear liquids until the nausea and/or vomiting subsides. Call your physician if vomiting continues.  Special Instructions/Symptoms: Your throat may feel dry or sore from the anesthesia or the breathing tube placed in your throat during surgery. If this causes discomfort, gargle with warm salt water. The discomfort should disappear within 24 hours.  If you had a scopolamine patch placed behind your ear for the management of post- operative nausea and/or vomiting:  1. The medication in the patch is effective for 72 hours, after which it should be removed.  Wrap patch in a tissue and discard in the trash. Wash hands thoroughly with soap and water. 2. You may remove the patch earlier than 72 hours if you experience unpleasant side effects which may include dry mouth, dizziness or visual disturbances. 3. Avoid touching the patch. Wash your hands with soap and water after contact with the patch.     D & C Home care Instructions:   Personal hygiene:  Used sanitary napkins for vaginal drainage not tampons. Shower or tub bathe the day after your procedure. No douching until bleeding stops. Always wipe from front to back after  Elimination.  Activity: Do not drive or operate any equipment today. The effects of the anesthesia are still present and drowsiness may result. Rest today, not  necessarily flat bed rest, just take it easy. You may resume your normal activity in one to 2 days.  Sexual activity: No intercourse for one week or as indicated by your physician  Diet: Eat a light diet as desired this evening. You may resume a regular diet tomorrow.  Return to work: One to 2 days.  General Expectations of your surgery: Vaginal bleeding should be no heavier than a normal period. Spotting may continue up to 10 days. Mild cramps may continue for a couple of days. You may have a regular period in 2-6 weeks.  Unexpected observations call your doctor if these occur: persistent or heavy bleeding. Severe abdominal cramping or pain. Elevation of temperature greater than 100F.  Call for an appointment in one week.    Patient's Signature_______________________________________________________  Nurse's Signature________________________________________________________

## 2018-07-03 NOTE — Transfer of Care (Signed)
Immediate Anesthesia Transfer of Care Note  Patient: Lori Carr  Procedure(s) Performed: DILATATION & CURETTAGE/HYSTEROSCOPY WITH Possible NOVASURE ABLATION (N/A )  Patient Location: PACU  Anesthesia Type:General  Level of Consciousness: awake, alert , oriented and patient cooperative  Airway & Oxygen Therapy: Patient Spontanous Breathing and Patient connected to nasal cannula oxygen  Post-op Assessment: Report given to RN and Post -op Vital signs reviewed and stable  Post vital signs: Reviewed and stable  Last Vitals:  Vitals Value Taken Time  BP    Temp 36.8 C 07/03/2018 10:45 AM  Pulse 83 07/03/2018 10:46 AM  Resp 11 07/03/2018 10:46 AM  SpO2 100 % 07/03/2018 10:46 AM  Vitals shown include unvalidated device data.  Last Pain:  Vitals:   07/03/18 0805  TempSrc: Oral         Complications: No apparent anesthesia complications

## 2018-07-03 NOTE — Progress Notes (Signed)
Patient seen and examined. Consent witnessed and signed. No changes noted. Update completed. BP (!) 153/93   Pulse 90   Temp 98 F (36.7 C) (Oral)   Resp 16   Ht 5\' 2"  (1.575 m)   Wt 82.9 kg   LMP 06/04/2018 Comment: neg preg test 06/06/18  SpO2 100%   BMI 33.43 kg/m   CBC    Component Value Date/Time   WBC 6.2 07/03/2018 0829   RBC 4.18 07/03/2018 0829   HGB 9.8 (L) 07/03/2018 0829   HGB 14.7 04/04/2018 0935   HCT 33.5 (L) 07/03/2018 0829   HCT 44.9 04/04/2018 0935   PLT 402 (H) 07/03/2018 0829   PLT 355 04/04/2018 0935   MCV 80.1 07/03/2018 0829   MCV 85 04/04/2018 0935   MCH 23.4 (L) 07/03/2018 0829   MCHC 29.3 (L) 07/03/2018 0829   RDW 14.1 07/03/2018 0829   RDW 12.5 04/04/2018 0935   LYMPHSABS 1.6 06/07/2018 0413   LYMPHSABS 2.4 04/04/2018 0935   MONOABS 1.5 (H) 06/07/2018 0413   EOSABS 0.0 06/07/2018 0413   EOSABS 0.2 04/04/2018 0935   BASOSABS 0.1 06/07/2018 0413   BASOSABS 0.0 04/04/2018 0935

## 2018-07-03 NOTE — Anesthesia Procedure Notes (Signed)
Procedure Name: LMA Insertion Date/Time: 07/03/2018 10:11 AM Performed by: Tyrone Nine, CRNA Pre-anesthesia Checklist: Patient identified, Emergency Drugs available, Suction available and Patient being monitored Patient Re-evaluated:Patient Re-evaluated prior to induction Oxygen Delivery Method: Circle system utilized Preoxygenation: Pre-oxygenation with 100% oxygen Induction Type: IV induction Ventilation: Mask ventilation without difficulty LMA: LMA inserted LMA Size: 4.0 Number of attempts: 1 Placement Confirmation: breath sounds checked- equal and bilateral,  CO2 detector and positive ETCO2 Tube secured with: Tape Dental Injury: Teeth and Oropharynx as per pre-operative assessment

## 2018-07-03 NOTE — Anesthesia Preprocedure Evaluation (Addendum)
Anesthesia Evaluation  Patient identified by MRN, date of birth, ID band Patient awake    Reviewed: Allergy & Precautions, NPO status , Patient's Chart, lab work & pertinent test results  Airway Mallampati: III  TM Distance: <3 FB Neck ROM: Limited    Dental  (+) Teeth Intact, Dental Advisory Given   Pulmonary neg pulmonary ROS,    breath sounds clear to auscultation       Cardiovascular negative cardio ROS   Rhythm:Regular Rate:Normal     Neuro/Psych negative neurological ROS  negative psych ROS   GI/Hepatic negative GI ROS, Neg liver ROS,   Endo/Other  negative endocrine ROSdiabetes, Type 2, Insulin Dependent  Renal/GU negative Renal ROS  negative genitourinary   Musculoskeletal negative musculoskeletal ROS (+)   Abdominal   Peds negative pediatric ROS (+)  Hematology negative hematology ROS (+)   Anesthesia Other Findings   Reproductive/Obstetrics negative OB ROS                          Anesthesia Physical Anesthesia Plan  ASA: III  Anesthesia Plan: General   Post-op Pain Management:    Induction: Intravenous  PONV Risk Score and Plan: 3 and Midazolam, Ondansetron and Treatment may vary due to age or medical condition  Airway Management Planned: LMA  Additional Equipment:   Intra-op Plan:   Post-operative Plan: Extubation in OR  Informed Consent: I have reviewed the patients History and Physical, chart, labs and discussed the procedure including the risks, benefits and alternatives for the proposed anesthesia with the patient or authorized representative who has indicated his/her understanding and acceptance.     Dental advisory given  Plan Discussed with: CRNA  Anesthesia Plan Comments:         Anesthesia Quick Evaluation

## 2018-07-03 NOTE — Op Note (Signed)
Lori Carr, KIERCE MEDICAL RECORD HW:[id number] ACCOUNT [id number] DATE OF BIRTH:02/10/1965 FACILITY: [facility] LOCATION: [facility] PHYSICIAN:Candido Flott J. Billy Coast, MD  OPERATIVE REPORT  DATE OF PROCEDURE:  07/03/2018  PREOPERATIVE DIAGNOSIS:  Abnormal uterine bleeding.  POSTOPERATIVE DIAGNOSIS:  Abnormal uterine bleeding, endometrial polyp.  PROCEDURE:  Diagnostic hysteroscopy, dilatation and curettage, endometrial polypectomy, NovaSure endometrial ablation.  SURGEON:  Olivia Mackie, MD  ASSISTANT:  None.  ANESTHESIA:  General local.  ESTIMATED BLOOD LOSS:  Minimal.  FLUID DEFICIT:  200 mL.  DISPOSITION:  The patient was taken to recovery in good condition.  BRIEF OPERATIVE NOTE:  After being apprised of the risks of anesthesia, infection, bleeding in surrounding organs, possible need for repair, delayed versus immediate complications including bowel, bladder, injury, possible need for repair, the patient  was brought to the operating room where she was administered a general anesthetic without complications.  Prepped and draped in usual sterile fashion, catheterized until the bladder was empty.  Exam under anesthesia revealed an anteflexed deviated uterus  and no adnexal masses.  At this time, dilute Marcaine solution was placed.  Standard paracervical block, 20 mL total.  Dilute Pitressin solution placed 3 and 9 o'clock, 18 mL total.  Cervix easily dilated up to a #21 Pratt dilator.  Visualization  revealed posterior wall endometrial polyp, which was resected without difficulty.  At this time, D and C was performed using sharp curettage in a 4-quadrant method.  NovaSure device was placed, seated to a length of 6.5, a width of 3.1, and initiated to  a power of 122 watts for 46 seconds after a negative CO2 test.  At this time, the procedure was terminated.  Revisualization revealed a well-ablated endometrial cavity.  The patient tolerated the procedure well, was awakened and  transferred to recovery  in good condition.  LN/NUANCE  D:07/03/2018 T:07/03/2018 JOB:[id number]

## 2018-07-03 NOTE — Op Note (Signed)
07/03/2018  10:33 AM  PATIENT:  Lori Carr  53 y.o. female  PRE-OPERATIVE DIAGNOSIS:  Menometrorrhagia  POST-OPERATIVE DIAGNOSIS:  Menometrorrhagia  PROCEDURE:  Procedure(s): DILATATION & CURETTAGE/ DIAGNOSTIC HYSTEROSCOPY WITH ENDOMETRIAL POLYPECTOMY NOVASURE ABLATION  SURGEON:  Surgeon(s): Olivia Mackie, MD  ASSISTANTS: none   ANESTHESIA:   local and general  ESTIMATED BLOOD LOSS: MINIMAL FLUID DEFICIT; 200CC  DRAINS: none   LOCAL MEDICATIONS USED:  MARCAINE    and Amount: 20 ml  SPECIMEN:  Source of Specimen:  [facility] AND POLYP  DISPOSITION OF SPECIMEN:  PATHOLOGY  COUNTS:  YES  DICTATION #: [id number]  PLAN OF CARE: DC HOME  PATIENT DISPOSITION:  PACU - hemodynamically stable.

## 2018-07-04 NOTE — Addendum Note (Signed)
Addendum  created 07/04/18 0800 by Tyrone Nine, CRNA   Charge Capture section accepted

## 2018-07-05 ENCOUNTER — Encounter (HOSPITAL_BASED_OUTPATIENT_CLINIC_OR_DEPARTMENT_OTHER): Payer: Self-pay | Admitting: Obstetrics and Gynecology

## 2018-07-06 ENCOUNTER — Encounter: Payer: Self-pay | Admitting: Internal Medicine

## 2018-07-06 ENCOUNTER — Other Ambulatory Visit: Payer: Self-pay

## 2018-07-06 ENCOUNTER — Ambulatory Visit (INDEPENDENT_AMBULATORY_CARE_PROVIDER_SITE_OTHER): Payer: BC Managed Care – PPO | Admitting: Internal Medicine

## 2018-07-06 VITALS — BP 142/82 | HR 96 | Temp 98.1°F | Ht 62.0 in | Wt 187.4 lb

## 2018-07-06 DIAGNOSIS — IMO0001 Reserved for inherently not codable concepts without codable children: Secondary | ICD-10-CM

## 2018-07-06 DIAGNOSIS — E119 Type 2 diabetes mellitus without complications: Secondary | ICD-10-CM

## 2018-07-06 DIAGNOSIS — Z794 Long term (current) use of insulin: Secondary | ICD-10-CM | POA: Diagnosis not present

## 2018-07-06 LAB — BASIC METABOLIC PANEL
BUN: 14 mg/dL (ref 6–23)
CO2: 26 mEq/L (ref 19–32)
Calcium: 9.1 mg/dL (ref 8.4–10.5)
Chloride: 99 mEq/L (ref 96–112)
Creatinine, Ser: 0.77 mg/dL (ref 0.40–1.20)
GFR: 78.7 mL/min (ref >60.00)
Glucose, Bld: 347 mg/dL — ABNORMAL HIGH (ref 70–99)
Potassium: 4.2 mEq/L (ref 3.5–5.1)
Sodium: 134 mEq/L — ABNORMAL LOW (ref 135–145)

## 2018-07-06 MED ORDER — INSULIN GLARGINE 100 UNIT/ML SOLOSTAR PEN: 22.0000 [IU] | PEN_INJECTOR | Freq: Every day | SUBCUTANEOUS | 3 refills | Status: DC

## 2018-07-06 MED ORDER — INSULIN ASPART 100 UNIT/ML ~~LOC~~ SOLN: 7.0000 [IU] | Freq: Three times a day (TID) | SUBCUTANEOUS | 11 refills | Status: DC

## 2018-07-06 MED ORDER — INSULIN ASPART 100 UNIT/ML FLEXPEN: 7.0000 [IU] | PEN_INJECTOR | Freq: Three times a day (TID) | SUBCUTANEOUS | 11 refills | Status: DC

## 2018-07-06 NOTE — Progress Notes (Signed)
Name: Lori Carr  MRN/ DOB: [id number], 12/17/1965   Age/ Sex: 53 y.o., female    PCP: Jamelle HaringHendrickson, Clifford D, MD   Reason for Endocrinology Evaluation: Type 2 Diabetes Mellitus     Date of Initial Endocrinology Visit: 07/06/2018     PATIENT IDENTIFIER: Lori Carr is a 53 y.o. female with a past medical history of Insulin- Dependent DM. The patient presented for initial endocrinology clinic visit on 07/06/2018 for consultative assistance with her diabetes management.    HPI: Lori Carr was    Diagnosed with T2DM in 2007 Prior Medications tried/Intolerance: She has been on insulin (MDI ) regimen since her diagnosis.  She also has been on Metformin at somepoint and was subsequently switched to Dapagliflozin-metformin (xigduo)- stopped during hospitalization in 06/2018. Currently checking blood sugars 3 x / day,  before meals . Prior to hospitalization, she has not been checking at all Hypoglycemia episodes : yes               Symptoms: hot and sweaty                 Frequency: 2/month   Hemoglobin A1c has ranged from 7.9% in 2015, peaking at 9.2% in 2020. Patient required assistance for hypoglycemia: no Patient has required hospitalization within the last 1 year from hyper or hypoglycemia: Yes- 06/2018 with severe DKA and PNA  In terms of diet, the patient avoids sugar sweetened beverages , eats 3 meals with occasional snacking   Brother with T1DM    HOME DIABETES REGIMEN: Lantus 28 units QHS Novolog per SS    Statin: no ACE-I/ARB: no Prior Diabetic Education: yes    METER DOWNLOAD SUMMARY: Date range evaluated: 5/6-07/06/2018 Fingerstick Blood Glucose Tests = 93 Average Number Tests/Day = 3.0 Overall Mean FS Glucose = 267 Standard Deviation = 105  BG Ranges: Low = 58 High = 491   Hypoglycemic Events/30 Days: BG < 50 = 0 Episodes of symptomatic severe hypoglycemia = 0   DIABETIC COMPLICATIONS: Microvascular complications:    Denies: CKD, retinopathy,  neuropathy   Last eye exam: Completed  2019  Macrovascular complications:    Denies: CAD, PVD, CVA   PAST HISTORY: Past Medical History:  Past Medical History:  Diagnosis Date  . Diabetes mellitus    type 2  . Obesity    Past Surgical History:  Past Surgical History:  Procedure Laterality Date  . CESAREAN SECTION     x3  . DILITATION & CURRETTAGE/HYSTROSCOPY WITH NOVASURE ABLATION N/A 07/03/2018   Procedure: DILATATION & CURETTAGE/HYSTEROSCOPY WITH Possible NOVASURE ABLATION;  Surgeon: Olivia Mackieaavon, Richard, MD;  [facility];  Service: Gynecology;  Laterality: N/A;  . tissue removed from stomach  as child  . TYMPANOSTOMY TUBE PLACEMENT  as child      Social History:  reports that she has never smoked. She has never used smokeless tobacco. She reports that she does not drink alcohol or use drugs. Family History:  Family History  Problem Relation Age of Onset  . Cancer Mother [id number]       deceased age 52 stage IV ovarian cancer  . Breast cancer Maternal Aunt 50  . Breast cancer Paternal Grandmother 8158  . Breast cancer Paternal Aunt 3260  . Stroke Brother   . Diabetes Mellitus I Brother      HOME MEDICATIONS: Allergies as of 07/06/2018      Reactions   Amoxicillin-pot Clavulanate Itching   Did it involve swelling of the  face/tongue/throat, SOB, or low BP? No Did it involve sudden or severe rash/hives, skin peeling, or any reaction on the inside of your mouth or nose? No Did you need to seek medical attention at a hospital or doctor's office? No When did it last happen?A long time ago If all above answers are "NO", may proceed with cephalosporin use.   Vicodin [hydrocodone-acetaminophen] Nausea Only      Medication List       Accurate as of July 06, 2018  3:08 PM. If you have any questions, ask your nurse or doctor.        FreeStyle Libre 14 Day Sensor Misc 26 Units by Does not apply route every 14 (fourteen) days.   insulin aspart 100  UNIT/ML injection Commonly known as:  novoLOG Inject 4 Units into the skin 3 (three) times daily with meals. Per sliding scale What changed:  how much to take   insulin glargine 100 UNIT/ML injection Commonly known as:  LANTUS Inject 28 Units into the skin at bedtime.   traMADol 50 MG tablet Commonly known as:  Ultram Take 1-2 tablets (50-100 mg total) by mouth every 6 (six) hours as needed.        ALLERGIES: Allergies  Allergen Reactions  . Amoxicillin-Pot Clavulanate Itching    Did it involve swelling of the face/tongue/throat, SOB, or low BP? No Did it involve sudden or severe rash/hives, skin peeling, or any reaction on the inside of your mouth or nose? No Did you need to seek medical attention at a hospital or doctor's office? No When did it last happen?A long time ago If all above answers are "NO", may proceed with cephalosporin use.  . Vicodin [Hydrocodone-Acetaminophen] Nausea Only     REVIEW OF SYSTEMS: A comprehensive ROS was conducted with the patient and is negative except as per HPI and below:  Review of Systems  Constitutional: Negative for fever and weight loss.  HENT: Negative for congestion and sore throat.   Eyes: Negative for blurred vision and pain.  Respiratory: Negative for cough and shortness of breath.   Cardiovascular: Negative for chest pain and palpitations.  Gastrointestinal: Negative for constipation and nausea.  Genitourinary: Negative for frequency.  Neurological: Negative for tingling and tremors.  Endo/Heme/Allergies: Negative for polydipsia.  Psychiatric/Behavioral: Negative for depression. The patient is not nervous/anxious.       OBJECTIVE:   VITAL SIGNS: BP (!) 142/82 (BP Location: Left Arm, Patient Position: Sitting, Cuff Size: Normal)   Pulse 96   Temp 98.1 F (36.7 C)   Ht  (1.575 m)   Wt 187 lb 6.4 oz (85 kg)   SpO2 97%   BMI 34.28 kg/m    PHYSICAL EXAM:  General: Pt appears well and is in NAD  Hydration:  Well-hydrated with moist mucous membranes and good skin turgor  HEENT: Head: Unremarkable with good dentition. Oropharynx clear without exudate.  Eyes: External eye exam normal without stare, lid lag or exophthalmos.  EOM intact.  PERRL.  Neck: General: Supple without adenopathy or carotid bruits. Thyroid: Thyroid size normal.  No goiter or nodules appreciated. No thyroid bruit.  Lungs: Clear with good BS bilat with no rales, rhonchi, or wheezes  Heart: RRR with normal S1 and S2 and no gallops; no murmurs; no rub  Abdomen: Normoactive bowel sounds, soft, nontender, without masses or organomegaly palpable  Extremities:  Lower extremities - No pretibial edema. No lesions.  Skin: Normal texture and temperature to palpation. No rash noted. No Acanthosis nigricans/skin  tags. No lipohypertrophy.  Neuro: MS is good with appropriate affect, pt is alert and Ox3    DM foot exam: 07/06/2018  The skin of the feet is intact without sores or ulcerations. The pedal pulses are 2+ on right and 2+ on left. The sensation is intact to a screening 5.07, 10 gram monofilament bilaterally   DATA REVIEWED:  Lab Results  Component Value Date   HGBA1C 9.2 (H) 06/07/2018   HGBA1C 8.9 (H) 04/04/2018   Lab Results  Component Value Date   LDLCALC 109 (H) 04/04/2018   CREATININE 0.81 07/03/2018    Lab Results  Component Value Date   CHOL 186 04/04/2018   HDL 63 04/04/2018   LDLCALC 109 (H) 04/04/2018   TRIG 71 04/04/2018   CHOLHDL 3.0 04/04/2018        ASSESSMENT / PLAN / RECOMMENDATIONS:   1) Type 2 Diabetes Mellitus, Poorly controlled, Without complications - Most recent A1c of 9.2 %. Goal A1c < 7.0 %.   Plan: GENERAL:  Poorly controlled diabetes due to medication non-adherence and dietary indiscretions.  I have discussed with the patient the pathophysiology of diabetes. We went over the natural progression of the disease. We talked about both insulin resistance and insulin deficiency. We stressed  the importance of lifestyle changes including diet and exercise. I explained the complications associated with diabetes including retinopathy, nephropathy, neuropathy as well as increased risk of cardiovascular disease. We went over the benefit seen with glycemic control.   I explained to the patient that diabetic patients are at higher than normal risk for amputations. The patient was informed that diabetes is the number one cause of non-traumatic amputations in [place]. Discussed pharmacokinetics of basal/bolus insulin and the importance of taking prandial insulin with meals.   We also discussed avoiding sugar-sweetened beverages and snacks, when possible.   Given severe DKA  And a brother with Typ1 Dm, I will check her for GAD-65 and anti-islet ab's   MEDICATIONS:  Decrease Lantus to 22 units QHS  Increase Novolog to 7 units TID QAC Novolog correctional insulin: ADD extra units on insulin to your meal-time Novolog dose if your blood sugars are higher than . Use the scale below to help guide you:   Blood sugar before meal Number of units to inject  Less than 170 0 unit  171 -  210 1 units  211 -  250 2 units  251 -  290 3 units  291 -  330 4 units  331 -  370 5 units  371 -  410 6 units  411 -  450 7 units  451 -  490 8 units     EDUCATION / INSTRUCTIONS:  BG monitoring instructions: Patient is instructed to check her blood sugars 4 times a day, before meals and bedtime.  Call [place] [facility] if: BG persistently < 70 or > 300. . I reviewed the Rule of 15 for the treatment of hypoglycemia in detail with the patient. Literature supplied.   2) Diabetic complications:   Eye: Does not have known diabetic retinopathy.   Neuro/ Feet: Does not have known diabetic peripheral neuropathy.  Renal: Patient does not have known baseline CKD. She is not on an ACEI/ARB at present.Check urine albumin/creatinine ratio yearly. If albuminuria is positive, treatment is geared  toward better glucose, blood pressure control and use of ACE inhibitors or ARBs. Monitor electrolytes and creatinine once to twice yearly.   3) Lipids: Patient is not on a statin. Per  ADA guidelines, pt should be on a statin starting at age 60 . Discussed cardiovascular benefit with the pt, will defer to PCP.    4) Elevated BP: BP today is minimally elevated above  goal of < 140/90 mmHg.    F/u 8 weeks    Signed electronically by: Lyndle Herrlich, MD  [facility] End[facility]  [facility] [address] [place], [place] [address] Phone: [phone] FAX: [phone]   CC: Jamelle Haring, MD [address] [place] [address] Phone: [phone]  Fax: [phone]    Return to Endocrinology clinic as below: Future Appointments  Date Time Provider Department Center  07/26/2018  9:00 AM [facility]  08/31/2018  1:20 PM Nyasia Baxley, Konrad Dolores, MD [facility] None

## 2018-07-06 NOTE — Addendum Note (Signed)
Addended by: Scarlette Shorts on: 07/06/2018 04:48 PM   Modules accepted: Orders

## 2018-07-06 NOTE — Patient Instructions (Addendum)
-   Decrease Lantus to 22 units at bedtime - Increase Novolog to 7 units with each meal  - Novolog correctional insulin: ADD extra units on insulin to your meal-time Novolog dose if your blood sugars are higher than . Use the scale below to help guide you:   Blood sugar before meal Number of units to inject  Less than 170 0 unit  171 -  210 1 units  211 -  250 2 units  251 -  290 3 units  291 -  330 4 units  331 -  370 5 units  371 -  410 6 units  411 -  450 7 units  451 -  490 8 units       Choose healthy, lower carb lower calorie snacks: toss salad, cooked vegetables, cottage cheese, peanut butter, low fat cheese / string cheese, lower sodium deli meat, tuna salad or chicken salad     HOW TO TREAT LOW BLOOD SUGARS (Blood sugar LESS THAN 70 MG/DL)  Please follow the RULE OF 15 for the treatment of hypoglycemia treatment (when your (blood sugars are less than 70 mg/dL)    STEP 1: Take 15 grams of carbohydrates when your blood sugar is low, which includes:   3-4 GLUCOSE TABS  OR  3-4 OZ OF JUICE OR REGULAR SODA OR  ONE TUBE OF GLUCOSE GEL     STEP 2: RECHECK blood sugar in 15 MINUTES STEP 3: If your blood sugar is still low at the 15 minute recheck --> then, go back to STEP 1 and treat AGAIN with another 15 grams of carbohydrates.

## 2018-07-06 NOTE — Addendum Note (Signed)
Addended by: Scarlette Shorts on: 07/06/2018 03:11 PM   Modules accepted: Orders

## 2018-07-08 LAB — GLUTAMIC ACID DECARBOXYLASE AUTO ABS: Glutamic Acid Decarb Ab: 137 IU/mL — ABNORMAL HIGH (ref <5)

## 2018-07-10 ENCOUNTER — Other Ambulatory Visit: Payer: Self-pay | Admitting: Internal Medicine

## 2018-07-10 LAB — ANTI-ISLET CELL ANTIBODY: Islet Cell Ab: NEGATIVE

## 2018-07-10 MED ORDER — INSULIN ASPART 100 UNIT/ML ~~LOC~~ SOLN: 7.0000 [IU] | Freq: Three times a day (TID) | SUBCUTANEOUS | 11 refills | Status: DC

## 2018-07-18 ENCOUNTER — Telehealth: Payer: Self-pay | Admitting: Internal Medicine

## 2018-07-18 MED ORDER — LANTUS SOLOSTAR 100 UNIT/ML ~~LOC~~ SOPN: 26.0000 [IU] | PEN_INJECTOR | Freq: Every day | SUBCUTANEOUS | 3 refills | Status: DC

## 2018-07-18 MED ORDER — INSULIN ASPART 100 UNIT/ML ~~LOC~~ SOLN: 10.0000 [IU] | Freq: Three times a day (TID) | SUBCUTANEOUS | 99 refills | Status: DC

## 2018-07-18 NOTE — Telephone Encounter (Signed)
Pt c/o elevated BS in the mid to upper 300s - mostly in AM fasting since insulin was changed.  Still taking Novolin 7-15units TID Sliding scale Lantus 22 units daily - increased herself to 26 units and is still having increased levels  Pt aware that I will send to Dr [facility] for recommendations.

## 2018-07-18 NOTE — Telephone Encounter (Signed)
Discussed BG results with the patient. She is not using insulin properly, she is snacking between the meals with no novolog coverage  Recommendations  Continue lantus at 26 units Increase Novolog to 10 units TIDQAC  Continue to use Correction factor Take 2 units of Novolog with snacks

## 2018-07-18 NOTE — Telephone Encounter (Addendum)
Per Dr Kelton Pillar - when was the Lantus increased to 26 units? Increased 2 days ago on 07/16/2018 Also, need last 4 days of daily BS readings.  Blood Sugar Readings 6/17  Breakfast     Lunch      Supper    Bedtime        8am              1239p          --             --   316                 236            --             -- 6/16 Breakfast     Lunch      Supper    Bedtime        822am           151p         613p        1214a  336                  73            360            335 6/15 Breakfast     Lunch      Supper    Bedtime        647a              118p         752p        --      314                196            251         --    6/14 Breakfast     Lunch      Supper    Bedtime        1017a            214p          842p         --              327                223            227           --

## 2018-07-18 NOTE — Telephone Encounter (Signed)
Patient called stating her sugars have been running to high since her insulin has been changed. Patient disconnected before able to come back inform everyone was at lunch.

## 2018-07-19 NOTE — Telephone Encounter (Signed)
Pt aware of changes  

## 2018-07-20 ENCOUNTER — Other Ambulatory Visit: Payer: Self-pay

## 2018-07-26 ENCOUNTER — Ambulatory Visit
Admission: RE | Admit: 2018-07-26 | Discharge: 2018-07-26 | Disposition: A | Discharge status: Home / Self Care | Payer: BC Managed Care – PPO | Source: Ambulatory Visit | Attending: Internal Medicine | Admitting: Internal Medicine

## 2018-07-26 ENCOUNTER — Other Ambulatory Visit: Payer: Self-pay

## 2018-07-26 DIAGNOSIS — Z1231 Encounter for screening mammogram for malignant neoplasm of breast: Secondary | ICD-10-CM

## 2018-08-09 DIAGNOSIS — N938 Other specified abnormal uterine and vaginal bleeding: Secondary | ICD-10-CM | POA: Diagnosis not present

## 2018-08-28 ENCOUNTER — Other Ambulatory Visit: Payer: Self-pay

## 2018-08-31 ENCOUNTER — Encounter: Payer: Self-pay | Admitting: Internal Medicine

## 2018-08-31 ENCOUNTER — Ambulatory Visit (INDEPENDENT_AMBULATORY_CARE_PROVIDER_SITE_OTHER): Payer: 59 | Admitting: Internal Medicine

## 2018-08-31 ENCOUNTER — Other Ambulatory Visit: Payer: Self-pay

## 2018-08-31 VITALS — BP 138/80 | HR 86 | Temp 98.1°F | Ht 62.0 in | Wt 191.0 lb

## 2018-08-31 DIAGNOSIS — Z794 Long term (current) use of insulin: Secondary | ICD-10-CM

## 2018-08-31 DIAGNOSIS — E119 Type 2 diabetes mellitus without complications: Secondary | ICD-10-CM | POA: Diagnosis not present

## 2018-08-31 DIAGNOSIS — E1065 Type 1 diabetes mellitus with hyperglycemia: Secondary | ICD-10-CM | POA: Insufficient documentation

## 2018-08-31 DIAGNOSIS — IMO0001 Reserved for inherently not codable concepts without codable children: Secondary | ICD-10-CM

## 2018-08-31 LAB — POCT GLYCOSYLATED HEMOGLOBIN (HGB A1C): Hemoglobin A1C: 8.7 % — AB (ref 4.0–5.6)

## 2018-08-31 NOTE — Patient Instructions (Addendum)
-   Increase Lantus to 30 units  - Increase Novolog to 12 units for an average meal                                     8 units for a small meal                 - take 6 units of novolog for a snack   Novolog correctional insulin: ADD extra units on insulin to your meal-time Novolog dose if your blood sugars are higher than . Use the scale below to help guide you:   Blood sugar before meal Number of units to inject  Less than 170 0 unit  171 -  210 1 units  211 -  250 2 units  251 -  290 3 units  291 -  330 4 units  331 -  370 5 units  371 -  410 6 units  411 -  450 7 units  451 -  490 8 units       Choose healthy, lower carb lower calorie snacks: toss salad, cooked vegetables, cottage cheese, peanut butter, low fat cheese / string cheese, lower sodium deli meat, tuna salad or chicken salad   HOW TO TREAT LOW BLOOD SUGARS (Blood sugar LESS THAN 70 MG/DL)  Please follow the RULE OF 15 for the treatment of hypoglycemia treatment (when your (blood sugars are less than 70 mg/dL)    STEP 1: Take 15 grams of carbohydrates when your blood sugar is low, which includes:   3-4 GLUCOSE TABS  OR  3-4 OZ OF JUICE OR REGULAR SODA OR  ONE TUBE OF GLUCOSE GEL     STEP 2: RECHECK blood sugar in 15 MINUTES STEP 3: If your blood sugar is still low at the 15 minute recheck --> then, go back to STEP 1 and treat AGAIN with another 15 grams of carbohydrates.

## 2018-08-31 NOTE — Progress Notes (Signed)
Name: Lori Carr  Age/ Sex: 53 y.o., female   MRN/ DOB: [id number], [birth year]     PCP: Towanda Malkin, MD   Reason for Endocrinology Evaluation: Type 1 Diabetes Mellitus  Initial Endocrine Consultative Visit: 07/06/2018    PATIENT IDENTIFIER: Lori Carr is a 53 y.o. female with a past medical history of T1DM. The patient has followed with Endocrinology clinic since 07/06/2018 for consultative assistance with management of her diabetes.  DIABETIC HISTORY:  Ms. Monreal was initially diagnosed with T2DM in 2007, she has been on MDI regimen since her diagnosis, she also was on Metformin at somepoint and was subsequently switched to Black (xigduo) which was stopped after hospitalization in 06/2018 for DKA. GAD-65 was elevated at  137 IU/mL which confirmed a diagnosis of T1DM . Her hemoglobin A1c has ranged from 7.9% in 2015, peaking at 9.2% in 2020.   Brother with T1DM  SUBJECTIVE:   During the last visit (07/06/2018): A1c 9.2%. Decreased Lantus and increase Novolog, she was provided with a correctional scale  Today (08/31/2018): [name] is here for a 2 month follow up on diabetes.  She checks her blood sugars 3-4 times daily, preprandial to breakfast and during the day The patient has not had hypoglycemic episodes since the last clinic visit. Otherwise, the patient has not required any recent emergency interventions for hypoglycemia and has not had recent hospitalizations secondary to hyper or hypoglycemic episodes.   She has been to the beach for the past week with dietary indiscretions, she has been having glucose readings in 300's and 400's. But she also had glucose readings in 80's.   She is compliant with insulin dose.   ROS: As per HPI and as detailed below: Review of Systems  Cardiovascular: Negative for chest pain and palpitations.  Gastrointestinal: Negative for  diarrhea and nausea.  Musculoskeletal: Positive for joint pain. Negative for neck pain.       Hand pain  Neurological: Positive for tingling. Negative for tremors.       Of hands       HOME DIABETES REGIMEN:  Lantus 26 units daily  Novolog 10 CF: Novolog (BG- 130/40)    METER DOWNLOAD SUMMARY: Date range evaluated: 7/18-7/31/2020 Fingerstick Blood Glucose Tests = 27 Average Number Tests/Day = 1.9 Overall Mean FS Glucose = 229  BG Ranges: Low = 79 High = 447   Hypoglycemic Events/30 Days: BG < 50 = 0 Episodes of symptomatic severe hypoglycemia = 0     HISTORY:  Past Medical History:  Past Medical History:  Diagnosis Date  . Diabetes mellitus    type 2  . Obesity    Past Surgical History:  Past Surgical History:  Procedure Laterality Date  . CESAREAN SECTION     x3  . DILITATION & CURRETTAGE/HYSTROSCOPY WITH NOVASURE ABLATION N/A 07/03/2018   Procedure: DILATATION & CURETTAGE/HYSTEROSCOPY WITH Possible NOVASURE ABLATION;  Surgeon: Brien Few, MD;  Location: [facility];  Service: Gynecology;  Laterality: N/A;  . tissue removed from stomach  as child  . TYMPANOSTOMY TUBE PLACEMENT  as child    Social History:  reports that she has never smoked. She has never used smokeless tobacco. She reports that she does not drink alcohol or use drugs. Family History:  Family History  Problem Relation Age of Onset  . Cancer Mother 25       deceased age 37 stage IV ovarian cancer  . Breast cancer Maternal Aunt 69  . Breast cancer  Paternal Grandmother 1958  . Breast cancer Paternal Aunt 4060  . Stroke Brother   . Diabetes Mellitus I Brother      HOME MEDICATIONS: Allergies as of 08/31/2018      Reactions   Amoxicillin-pot Clavulanate Itching   Did it involve swelling of the face/tongue/throat, SOB, or low BP? No Did it involve sudden or severe rash/hives, skin peeling, or any reaction on the inside of your mouth or nose? No Did you need to seek medical  attention at a hospital or doctor's office? No When did it last happen?A long time ago If all above answers are "NO", may proceed with cephalosporin use.   Vicodin [hydrocodone-acetaminophen] Nausea Only      Medication List       Accurate as of August 31, 2018  1:41 PM. If you have any questions, ask your nurse or doctor.        FreeStyle Libre 14 Day Sensor Misc 26 Units by Does not apply route every 14 (fourteen) days.   insulin aspart 100 UNIT/ML injection Commonly known as: NovoLOG Inject 7 Units into the skin 3 (three) times daily before meals. Max daily 67 units What changed: how much to take   insulin aspart 100 UNIT/ML injection Commonly known as: novoLOG Inject 10 Units into the skin 3 (three) times daily before meals. Max daily 80 units What changed: Another medication with the same name was changed. Make sure you understand how and when to take each.   Lantus SoloStar 100 UNIT/ML Solostar Pen Generic drug: Insulin Glargine Inject 26 Units into the skin daily.   traMADol 50 MG tablet Commonly known as: Ultram Take 1-2 tablets (50-100 mg total) by mouth every 6 (six) hours as needed.        OBJECTIVE:   Vital Signs: BP 138/80 (BP Location: Left Arm, Patient Position: Sitting, Cuff Size: Large)   Pulse 86   Temp 98.1 F (36.7 C)   Ht 5\' 2"  (1.575 m)   Wt 191 lb (86.6 kg)   SpO2 98%   BMI 34.93 kg/m   Wt Readings from Last 3 Encounters:  08/31/18 191 lb (86.6 kg)  07/06/18 187 lb 6.4 oz (85 kg)  07/03/18 182 lb 12.8 oz (82.9 kg)     Exam: General: Pt appears well and is in NAD  Lungs: Clear with good BS bilat with no rales, rhonchi, or wheezes  Heart: RRR with normal S1 and S2 and no gallops; no murmurs; no rub  Abdomen: Normoactive bowel sounds, soft, nontender, without masses or organomegaly palpable  Extremities: No pretibial edema. No tremor. Normal strength and motion throughout. See detailed diabetic foot exam below.  Skin: Normal  texture and temperature to palpation. No rash noted. No Acanthosis nigricans/skin tags. No lipohypertrophy.  Neuro: MS is good with appropriate affect, pt is alert and Ox3    DM foot exam: 07/06/2018  The skin of the feet is intact without sores or ulcerations. The pedal pulses are 2+ on right and 2+ on left. The sensation is intact to a screening 5.07, 10 gram monofilament bilaterally   DATA REVIEWED:  Lab Results  Component Value Date   HGBA1C 8.7 (A) 08/31/2018   HGBA1C 9.2 (H) 06/07/2018   HGBA1C 8.9 (H) 04/04/2018   Lab Results  Component Value Date   LDLCALC 109 (H) 04/04/2018   CREATININE 0.77 07/06/2018    Lab Results  Component Value Date   CHOL 186 04/04/2018   HDL 63 04/04/2018   LDLCALC 109 (  H) 04/04/2018   TRIG 71 04/04/2018   CHOLHDL 3.0 04/04/2018        Results for Claudell KyleJACOBS, Analaura M (MRN [id number]) as of 08/31/2018 12:53  Ref. Range 04/04/2018 09:35  MICROALB/CREAT RATIO Latest Ref Range: 0 - 29 mg/g creat 28   ASSESSMENT / PLAN / RECOMMENDATIONS:   1) Type 1 Diabetes Mellitus, Poorly controlled, Without complications - Most recent A1c of 8.7 %. Goal A1c < 7.0 %.  Plan:  - Her glycemic control is improving but she still have variable glucose readings between 70's and 300's, we did discussed CHO-insulin mismatch, she is interested in carb counting - We did discuss add-on therapy to Improve insulin resistant such as metformin and GLP-1 agonists but she is not interested at this time - We also discussed insulin pump and CGM, she will check with her company about Dexcom - Will refer her to our CDE for carb counting     MEDICATIONS: - Increase Lantus to 30 units  - Increase Novolog to 12 units for an average meal                                     8 units for a small meal                 - take 6 units of novolog for a snack   Novolog correctional insulin: ADD extra units on insulin to your meal-time Novolog dose if your blood sugars are higher than .  Use the scale below to help guide you:   Blood sugar before meal Number of units to inject  Less than 170 0 unit  171 -  210 1 units  211 -  250 2 units  251 -  290 3 units  291 -  330 4 units  331 -  370 5 units  371 -  410 6 units  411 -  450 7 units  451 -  490 8 units     EDUCATION / INSTRUCTIONS:  BG monitoring instructions: Patient is instructed to check her blood sugars 4 times a day, before meals and bedtime.  Call [facility] if: BG persistently < 70 or > 300. . I reviewed the Rule of 15 for the treatment of hypoglycemia in detail with the patient. Literature supplied.   2) Dyslipidemia: Patient is not on a statin. Discussed  ADA guidelines, I recommend starting lipitor or crestor. Discussed cardiovascular benefit with the pt, she would like to discuss this with her PCP  3) Hand numbness:  - We discussed D/D of cervical radiculopathy vs carpal tunnel vs diabetic neuropathy, this is not typical of diabetic neuropathy though, as it usually starts in the LE then spreads to the UE    F/U in 3 months    Signed electronically by: Lyndle HerrlichAbby Jaralla Shamleffer, MD  [facility]  [facility] [address] [place], [place] [address] Phone: [phone] FAX: [phone]   CC: Jamelle HaringHendrickson, Clifford D, MD [address] [place] [address] Phone: [phone]  Fax: [phone]  Return to Endocrinology clinic as below: No future appointments.

## 2018-09-03 ENCOUNTER — Other Ambulatory Visit: Payer: Self-pay

## 2018-09-03 ENCOUNTER — Encounter: Payer: Self-pay | Admitting: Internal Medicine

## 2018-09-03 ENCOUNTER — Ambulatory Visit: Payer: Self-pay | Admitting: Internal Medicine

## 2018-09-03 VITALS — BP 129/79 | HR 79 | Temp 97.5°F | Resp 14 | Ht 62.0 in | Wt 192.0 lb

## 2018-09-03 DIAGNOSIS — M25531 Pain in right wrist: Secondary | ICD-10-CM

## 2018-09-03 DIAGNOSIS — G629 Polyneuropathy, unspecified: Secondary | ICD-10-CM | POA: Insufficient documentation

## 2018-09-03 DIAGNOSIS — E1065 Type 1 diabetes mellitus with hyperglycemia: Secondary | ICD-10-CM

## 2018-09-03 DIAGNOSIS — G5691 Unspecified mononeuropathy of right upper limb: Secondary | ICD-10-CM

## 2018-09-03 DIAGNOSIS — G5692 Unspecified mononeuropathy of left upper limb: Secondary | ICD-10-CM

## 2018-09-03 DIAGNOSIS — M65932 Unspecified synovitis and tenosynovitis, left forearm: Secondary | ICD-10-CM

## 2018-09-03 DIAGNOSIS — M79641 Pain in right hand: Secondary | ICD-10-CM

## 2018-09-03 DIAGNOSIS — M659 Synovitis and tenosynovitis, unspecified: Secondary | ICD-10-CM

## 2018-09-03 DIAGNOSIS — G5603 Carpal tunnel syndrome, bilateral upper limbs: Secondary | ICD-10-CM

## 2018-09-03 NOTE — Progress Notes (Signed)
S - Patient is a 53 y.o. white female with h/o DM, and after hospitalized with DKA and f/u here after, has more recently been followed with endocrine, and BS's getting under better control.  Noted now her hands are often painful and tingling, started after was lifting a heavy object up and felt a pop on her right and then later that night was more painful, did swell some and she notes her hands still often feel swollen and feels tingling in her hands, both, but more prominent on her right. Is right handed. When pushed, feels it more on the thumb side than the pinky side, but often can feel throughout the entire hand. Has dropped a couple things in the past weeks.  Saw her endocrine doctor and noted this, and not felt likely secondary to DM as usually in LE's to UE's per his input. No loss of function of her fingers or thumb, no shoulder, elbow pains. Not painful in one spot on her wrists. Having trouble sleeping at night due to the pain/numbness. Before this trauma 4 weeks ago, never had pain or tingling in her hands ntoed  No h/o thyroid disease Allergies  Allergen Reactions  . Amoxicillin-Pot Clavulanate Itching    Did it involve swelling of the face/tongue/throat, SOB, or low BP? No Did it involve sudden or severe rash/hives, skin peeling, or any reaction on the inside of your mouth or nose? No Did you need to seek medical attention at a hospital or doctor's office? No When did it last happen?A long time ago If all above answers are "NO", may proceed with cephalosporin use.  . Vicodin [Hydrocodone-Acetaminophen] Nausea Only   Current Outpatient Medications on File Prior to Visit  Medication Sig Dispense Refill  . insulin aspart (NOVOLOG) 100 UNIT/ML injection Inject 7 Units into the skin 3 (three) times daily before meals. Max daily 67 units (Patient taking differently: Inject into the skin 3 (three) times daily before meals. Inject 10-12 units three times a day) 20 mL 11  . Insulin  Glargine (LANTUS SOLOSTAR) 100 UNIT/ML Solostar Pen Inject 26 Units into the skin daily. (Patient taking differently: Inject 30 Units into the skin at bedtime. ) 15 mL 3  . pantoprazole (PROTONIX) 40 MG tablet      No current facility-administered medications on file prior to visit.    No tob hx   O - NAD, masked, obese  BP 129/79 (BP Location: Right Arm, Patient Position: Sitting, Cuff Size: Large)   Pulse 79   Temp (!) 97.5 F (36.4 C) (Oral)   Resp 14   Ht 5\' 2"  (1.575 m)   Wt 192 lb (87.1 kg)   SpO2 97%   BMI 35.12 kg/m   HEENT - sclera anicteric Neck - good ROM, NT over cervical spine.  UE - shoulders with good ROM, not painful with testing Elbows with good ROM, no pain with ROM, Tinel's negative at UCL of elbow. No swelling Wrists with good ROM and no pain testing ROM, did hand extension/flexion and supination/pronation vs resistance and not painful bilat NT palpating the distal ulna and radius bilat Compression test negative bilat Good pinch strength with thumb and pinky and not painful resisting pull through bilat Phelan's test  - mildly positive on right (tingling increase in right hand after 15+ seconds) Tinels at wrist for carpal tunnel - positive, more so on right vs left + tenderness with palpating the snuff box on the right, quite marked, NT on the left NT palpating  the hand diffusely on right and left Good wrist/hand strength, with adequate grip and minimally painful testing grip strength (not marked) bilat Pulses intact in wrist, good cap refill bilat Sensation intact to LT over hands bilat   Last labs reviewed - thyroid tests ok in March  Ass/Plan 1. Neuropathy - UE's. more on right than left. Has elements of carpal tunnel syndrome, with concern for symptoms beginning after a trauma noted and the inflammatory response after likely source and persisting. Elements of a tenosynovitis component with more diffuseness to tingling and pain than the classic median  nerve distribution. Bothered by how tender she was with snuff box palpation and do feel an x-ray is needed  Educated on above Will get an right wrist x-ray with a navicular view included to assess Soft wrist restraints bilat, and will wear ATC for next 3 weeks including sleeping in them. Can have off briefly at night when home and in controlled setting and can apply some cold topically to help with inflammation (if more painful - nerve type pain when does can stop) She has alleve and can use two tabs bid with food for next 5-7 days, then more prn and she notes she doesn't usually take her PPI, has if needed and take aleve with food to help. Not add a stronger anti-inflammatory or steroid (with her DM) Noted if sx's not settling down, likely will need to proceed with NCS/EMG to further assess  2. DM - followed with endocrine and helping with med adjustment and insulin doses for better control at present  As above, endocrine not feel this is a neuropathy related to her DM and I agree with that

## 2018-09-04 ENCOUNTER — Ambulatory Visit
Admission: RE | Admit: 2018-09-04 | Discharge: 2018-09-04 | Disposition: A | Discharge status: Home / Self Care | Payer: 59 | Source: Ambulatory Visit | Attending: Internal Medicine | Admitting: Internal Medicine

## 2018-09-04 DIAGNOSIS — M79641 Pain in right hand: Secondary | ICD-10-CM | POA: Diagnosis not present

## 2018-09-04 DIAGNOSIS — S6991XA Unspecified injury of right wrist, hand and finger(s), initial encounter: Secondary | ICD-10-CM | POA: Diagnosis not present

## 2018-09-04 DIAGNOSIS — M25531 Pain in right wrist: Secondary | ICD-10-CM | POA: Insufficient documentation

## 2018-09-04 NOTE — Addendum Note (Signed)
Addended by: Virgilio Frees on: 09/04/2018 02:42 PM   Modules accepted: Orders

## 2018-09-14 ENCOUNTER — Telehealth: Payer: Self-pay | Admitting: Internal Medicine

## 2018-09-14 ENCOUNTER — Other Ambulatory Visit: Payer: Self-pay

## 2018-09-14 NOTE — Telephone Encounter (Signed)
She said [facility] sent a request for her refill on Free style libre. I didn't see any request in her chart.

## 2018-09-14 NOTE — Telephone Encounter (Signed)
Called pharm and she has a refill pt states that she needs a PA on the freestyle libera I have completed the form and will let her know outcome

## 2018-09-18 ENCOUNTER — Telehealth: Payer: Self-pay | Admitting: Internal Medicine

## 2018-09-18 MED ORDER — DEXCOM G6 TRANSMITTER MISC: 1.0000 | 3 refills | Status: DC

## 2018-09-18 MED ORDER — DEXCOM G6 SENSOR MISC: 1.0000 | 11 refills | Status: DC

## 2018-09-18 MED ORDER — DEXCOM G6 RECEIVER DEVI: 1.0000 | 0 refills | Status: DC

## 2018-09-18 NOTE — Telephone Encounter (Signed)
Lori Carr called & checking on status of Lori Carr system [employer] coverage.  Said she checked at [place] they haven't heard anything.  Frann Rider was helping her last week.  Advised that will have Leisha follow up with her.  AMD

## 2018-09-18 NOTE — Telephone Encounter (Signed)
Please advise, Checked your ov note and do not see any reference to this

## 2018-09-18 NOTE — Telephone Encounter (Signed)
Advised pt faxed form but may not get approved. She states she sees Endocrinology and I advised that she should contact them maybe they can get her a CGM or approved as they are a specialist

## 2018-09-18 NOTE — Telephone Encounter (Signed)
Patient called to advise that she would [facility] to [facility] as discussed with doctor if her insurance will cover it.  Her pharmacy is [facility].

## 2018-09-20 ENCOUNTER — Encounter: Payer: Self-pay | Admitting: Internal Medicine

## 2018-09-20 ENCOUNTER — Telehealth: Payer: Self-pay | Admitting: Internal Medicine

## 2018-09-20 ENCOUNTER — Other Ambulatory Visit: Payer: Self-pay

## 2018-09-20 ENCOUNTER — Ambulatory Visit: Payer: Managed Care, Other (non HMO) | Admitting: Internal Medicine

## 2018-09-20 VITALS — BP 142/84 | HR 102 | Temp 98.4°F | Resp 16 | Ht 62.0 in | Wt 195.0 lb

## 2018-09-20 DIAGNOSIS — M79642 Pain in left hand: Secondary | ICD-10-CM

## 2018-09-20 DIAGNOSIS — M79643 Pain in unspecified hand: Secondary | ICD-10-CM | POA: Insufficient documentation

## 2018-09-20 DIAGNOSIS — E1065 Type 1 diabetes mellitus with hyperglycemia: Secondary | ICD-10-CM

## 2018-09-20 DIAGNOSIS — G629 Polyneuropathy, unspecified: Secondary | ICD-10-CM

## 2018-09-20 DIAGNOSIS — M79641 Pain in right hand: Secondary | ICD-10-CM

## 2018-09-20 MED ORDER — ATORVASTATIN CALCIUM 10 MG PO TABS: 10.0000 mg | ORAL_TABLET | Freq: Every day | ORAL | 3 refills | Status: DC

## 2018-09-20 NOTE — Progress Notes (Signed)
S - Patient is a 53 y.o. white female with h/o DM, and after hospitalized with DKA and f/u here after, has more recently been followed with endocrine, and BS's getting under better control.   F/u after saw 8/3 for hands often painful and tingling, started after was lifting a heavy object up and felt a pop on her right and then later that night was more painful, did swell some and she notes her hands still often felt swollen and felt tingling in her hands, both, but more prominent on her right.  Is right handed. When pushed, felt it more on the thumb side than the pinky side, but often could feel throughout the entire hand. Saw her endocrine doctor and noted this, and not felt likely secondary to DM as usually in LE's to UE's per his input. X-ray last visit with navicular view was negative and treated with soft wrist restraints to wear regularly, but not wearing at work noted and wears sometimes at home.  Since last visit, notes no better, keeps her up at night at times, dropping things at times. no shoulder, elbow pains. Not painful in one spot on her wrists.  Before this trauma 4 weeks ago, never had pain or tingling in her hands noted No tob hx No h/o thyroid disease  No tob hx Allergies  Allergen Reactions  . Amoxicillin-Pot Clavulanate Itching    Did it involve swelling of the face/tongue/throat, SOB, or low BP? No Did it involve sudden or severe rash/hives, skin peeling, or any reaction on the inside of your mouth or nose? No Did you need to seek medical attention at a hospital or doctor's office? No When did it last happen?A long time ago If all above answers are "NO", may proceed with cephalosporin use.  . Vicodin [Hydrocodone-Acetaminophen] Nausea Only   Current Outpatient Medications on File Prior to Visit  Medication Sig Dispense Refill  . Continuous Blood Gluc Receiver (DEXCOM G6 RECEIVER) DEVI 1 Device by Does not apply route as directed. 1 Device 0  . Continuous Blood  Gluc Sensor (DEXCOM G6 SENSOR) MISC 1 Device by Does not apply route as directed. 3 each 11  . Continuous Blood Gluc Transmit (DEXCOM G6 TRANSMITTER) MISC 1 Device by Does not apply route as directed. 1 each 3  . insulin aspart (NOVOLOG) 100 UNIT/ML injection Inject 7 Units into the skin 3 (three) times daily before meals. Max daily 67 units (Patient taking differently: Inject into the skin 3 (three) times daily before meals. Inject 10-12 units three times a day) 20 mL 11  . Insulin Glargine (LANTUS SOLOSTAR) 100 UNIT/ML Solostar Pen Inject 26 Units into the skin daily. (Patient taking differently: Inject 30 Units into the skin at bedtime. ) 15 mL 3  . pantoprazole (PROTONIX) 40 MG tablet      No current facility-administered medications on file prior to visit.      O - NAD, masked, obese  BP (!) 142/84 (BP Location: Left Arm, Patient Position: Sitting, Cuff Size: Large)   Pulse (!) 102   Temp 98.4 F (36.9 C) (Oral)   Resp 16   Ht 5\' 2"  (1.575 m)   Wt 195 lb (88.5 kg)   SpO2 97%   BMI 35.67 kg/m    HEENT - sclera anicteric Wrists with good ROM  NT palpating the distal ulna and radius bilat Compression test negative bilat Phelan's test  - remains mildly positive on right (tingling increase in right hand after 15+ seconds) Tinels at  wrist for carpal tunnel - negative today no tenderness with palpating the snuff box on the right or left NT palpating the hand diffusely on right and left Good wrist/hand strength, with adequate grip and minimally painful testing grip strength (not marked) bilat Pulses intact in wrist, good cap refill bilat Sensation intact to LT over hands bilat  Affect not flat, approp with conversation  Last labs reviewed - thyroid tests ok in March  Ass/Plan 1. Neuropathy - UE's. more on right than left. Has elements of carpal tunnel syndrome, . Not improving with conservative measures, but some concern with compliance of wearing wrist restraints  noted  Cont the Soft wrist restraints bilat, and rec'ed wearing more frequently to try to help get this to settle down She has alleve and use more prn and take aleve with food to help.  Proceed with NCS/EMG and ordered  2. DM - followed with endocrine and helping with med adjustment and insulin doses for better control at present, they also rec'ed a statin and patient asked about that today  Educated on statins and rec'ed starting and Lipitor - 10mg  daily ordered Noted risks/benefits

## 2018-09-20 NOTE — Telephone Encounter (Signed)
She would like skin adhesive for her new dexcom g6.

## 2018-09-20 NOTE — Progress Notes (Signed)
Hands - tigling & numbing worse than last visit.  States wakes her up multiple times at night.  Dropping things.  Feels like pins all the time.  Feel sore & tight.

## 2018-09-21 ENCOUNTER — Telehealth: Payer: Self-pay

## 2018-09-21 NOTE — Telephone Encounter (Signed)
Pt didn't need adhesive

## 2018-09-21 NOTE — Telephone Encounter (Signed)
Pt has an appt with [facility] for a EMG on 10/10/18 at 2pm. She is to not use any oils or lotions on that day   Pt aware

## 2018-09-21 NOTE — Addendum Note (Signed)
Addended by: Virgilio Frees on: 09/21/2018 10:27 AM   Modules accepted: Orders

## 2018-09-26 ENCOUNTER — Encounter: Payer: 59 | Attending: Internal Medicine | Admitting: Nutrition

## 2018-09-26 ENCOUNTER — Other Ambulatory Visit: Payer: Self-pay

## 2018-09-26 DIAGNOSIS — E1065 Type 1 diabetes mellitus with hyperglycemia: Secondary | ICD-10-CM | POA: Diagnosis not present

## 2018-09-28 NOTE — Progress Notes (Signed)
Patient is here for diet/insuin review.   Insulin dose:  Lantus 30u at MN, Novolog: 12-16u, she alternates this depending on meal size and current blood sugar readings.  She has a sliding scale adjustment for high readings that she says she follows, and says she takes her Novolog consistently before all meals. Typical day: 6:30 up.   8AM: water 9:30-10 [employer] fries chichen tneders with fries and water or diet drink  " this is almost every day". 1-2 Fast food:  [employer], with fries, or salad with all of dressing, spaghetti, with side of bread, Water to drink  Fast food dinner 5 days/wk of mix veg. Plate of mac and cheese, potatoes, and greens with biscuit, or checken salad with all the packet of ranch dressing, and crackers, or "whatever".  Water to drink.  Discussed the importance of balanced meals and what happens to blood sugar readings when portion sizes are more that the body can handle of carbs, protein or fat.  Discussed that fat portions of her meals are extremely high requiring more insulin at later times, and suggested she take her Novolog after the meal when readings before meal are good.(yesterday blood sugar before lunch was 138.  She took her Novolog and left work and picked up her lunch of fried chichen sandwich, fries and blood sugar dropped while eating.  She said that she could not get the blood sugar to go up, and after eating candy bar, and drinking soda, blood sugar remained low for " a long time".  Discussed how fat slows blood sugar rise down and to never take Novolog when meal is delayed.  She agreed to do this. Suggested she take the Novolog after eating the fried chicken biscuit, and other high fat meals, and then see if blood sugar reading is lower before lunch.  She agreed to try this.  It was explained that the more fat she eats, the more insulin she will need, and the heavier she will get.  I strongly encouraged her to pick lower fat choices when eating out.  Suggestions  were given for this.  She did not verbalize or nod her head in agreement with the need for this.  I strongly believe that there is little motivation for dietary changes.

## 2018-09-30 NOTE — Patient Instructions (Signed)
Pick lower fat choices when eating out: ie: grilled meats, low fat salad dressings, no cheese on burgers, use low fat mayo at home, grill or bake instead of frying, or use less oil in pan when cooking.   Call if questions.

## 2018-10-15 ENCOUNTER — Other Ambulatory Visit: Payer: Self-pay

## 2018-10-15 DIAGNOSIS — Z20822 Contact with and (suspected) exposure to covid-19: Secondary | ICD-10-CM

## 2018-10-15 DIAGNOSIS — R6889 Other general symptoms and signs: Secondary | ICD-10-CM | POA: Diagnosis not present

## 2018-10-16 LAB — NOVEL CORONAVIRUS, NAA: SARS-CoV-2, NAA: DETECTED — AB

## 2018-11-01 ENCOUNTER — Telehealth: Payer: Self-pay | Admitting: Internal Medicine

## 2018-11-01 NOTE — Telephone Encounter (Signed)
Wants advice concerning appt.she had yesterday with Dr. Melrose Nakayama.

## 2018-11-01 NOTE — Telephone Encounter (Signed)
Called patient.  She had the EMG/NCS testing done yesterday for carpal tunnel concern thru [facility] system and she was told one side was severe and the other also had evidence of carpal tunnel syndrome (CTS), but less severe and they rec'ed surgery.  I tried to get the results on EPIC and not yet able.  If the results are as she noted, I would recommend proceeding with surgery as concerns exist for longer term problems with untreated severe CTS. I did not have a specific surgeon to recommend presently.  I advised her to call the office again on Monday and hopefully the results of the study will be available by then and can be advised on next steps (as there was a question of this possibly being work-related).   She is aware that I will not be available to see her for a f/u visit as after today, I am not available to see patients in this clinic. She understood.

## 2018-11-12 ENCOUNTER — Telehealth: Payer: Self-pay | Admitting: Occupational Medicine

## 2018-11-12 NOTE — Telephone Encounter (Signed)
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  EMG at the [facility] clinic showed evidence of chronic severe left and mild right carpal tunnel syndrome.  I explained the diagnosis to the patient.  She will need referral to orthopedics.  The patient prefers to use [facility].  All questions answered.  No plan follow-up in this clinic regarding this problem.

## 2018-11-13 ENCOUNTER — Ambulatory Visit: Payer: Managed Care, Other (non HMO)

## 2018-11-13 ENCOUNTER — Other Ambulatory Visit: Payer: Self-pay

## 2018-11-20 DIAGNOSIS — M189 Osteoarthritis of first carpometacarpal joint, unspecified: Secondary | ICD-10-CM | POA: Diagnosis not present

## 2018-11-20 DIAGNOSIS — G5602 Carpal tunnel syndrome, left upper limb: Secondary | ICD-10-CM | POA: Diagnosis not present

## 2018-11-20 DIAGNOSIS — M654 Radial styloid tenosynovitis [de Quervain]: Secondary | ICD-10-CM | POA: Diagnosis not present

## 2018-11-30 ENCOUNTER — Telehealth: Payer: Self-pay

## 2018-11-30 NOTE — Telephone Encounter (Signed)
Gerarda Fraction, NP-C [phone])  reviewed 3 of 3 pages of [facility] notes from Leretha Pol, MD on 11/28/2018.  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  Pt received steroid injection Bilateral & brace & is to take B6 supplement.  F/U prn with ortho.  AMD

## 2018-12-04 ENCOUNTER — Ambulatory Visit: Payer: 59 | Admitting: Internal Medicine

## 2019-02-11 ENCOUNTER — Ambulatory Visit: Payer: Managed Care, Other (non HMO) | Admitting: Occupational Medicine

## 2019-02-11 ENCOUNTER — Other Ambulatory Visit: Payer: Self-pay

## 2019-02-11 ENCOUNTER — Encounter: Payer: Self-pay | Admitting: Occupational Medicine

## 2019-02-11 VITALS — BP 130/80 | HR 76 | Temp 96.9°F | Resp 12 | Ht 62.0 in | Wt 211.0 lb

## 2019-02-11 DIAGNOSIS — M659 Synovitis and tenosynovitis, unspecified: Secondary | ICD-10-CM

## 2019-02-11 DIAGNOSIS — K2970 Gastritis, unspecified, without bleeding: Secondary | ICD-10-CM | POA: Insufficient documentation

## 2019-02-11 DIAGNOSIS — Z794 Long term (current) use of insulin: Secondary | ICD-10-CM

## 2019-02-11 DIAGNOSIS — S8390XA Sprain of unspecified site of unspecified knee, initial encounter: Secondary | ICD-10-CM | POA: Insufficient documentation

## 2019-02-11 DIAGNOSIS — E1169 Type 2 diabetes mellitus with other specified complication: Secondary | ICD-10-CM

## 2019-02-11 LAB — POCT GLYCOSYLATED HEMOGLOBIN (HGB A1C): Hemoglobin A1C: 7.7 % — AB (ref 4.0–5.6)

## 2019-02-11 MED ORDER — NAPROXEN 500 MG PO TABS: 500.0000 mg | ORAL_TABLET | Freq: Two times a day (BID) | ORAL | 0 refills | Status: DC

## 2019-02-11 MED ORDER — DICLOFENAC SODIUM 1 % EX GEL: 2.0000 g | Freq: Four times a day (QID) | CUTANEOUS | 0 refills | Status: DC

## 2019-02-11 NOTE — Progress Notes (Signed)
Dr. Dorris Fetch referred Lavonna Rua to [facility].  Sees Dr. Konrad Dolores Shamleffter.  Last Epic visit/notes with Dr. Lonzo Cloud was 08/31/2018.  Had appt scheduled with Dr. Lonzo Cloud on 12/04/2018 that was canceled by patient.  Called our office today requesting an A1c Now check. A1c Now = 7.7  AMD

## 2019-02-11 NOTE — Progress Notes (Signed)
Patient ID: Lori Carr DOB: 1965-03-19 AGE: 54 y.o. MRN: [id number]   PCP: No primary care provider on file.   Chief Complaint:  Chief Complaint  Patient presents with  . Follow-up    Called office today requesting A1c Now  . Covid Screening    No symptoms     Subjective:    HPI:  Lori Carr is a 54 y.o. female presents for evaluation  Chief Complaint  Patient presents with  . Follow-up    Called office today requesting A1c Now  . Covid Screening    No symptoms   54 year old female presents to [facility] with request for A1C check.  A1C today was 7.7 (02/10/2018). Patient's weight today is 211lbs. Patient's weight on 04/04/2018, last annual physical, was 199lbs. Patient has gained 12lbs in one year.  A1C:  08/31/2018 - 8.7 (5 months ago) 06/07/2018 - 9.2 (8 months ago) 04/04/2018 - 8.9 (10 months ago)  Patient reports no change in diet or exercise over past year. Has had very few lows/crashes of hypoglycemia. States few times a month will get in the 60s, typically early morning, experiences cold sweats and jitteriness/shakiness, resolves with eating. No presyncope/syncope episodes.  Patient seen by Leonia Reader RN with [facility] on 09/26/2018; "It was explained that the more fat she eats, the more insulin she will need, and the heavier she will get.  I strongly encouraged her to pick lower fat choices when eating out.  Suggestions were given for this.  She did not verbalize or nod her head in agreement with the need for this.  I strongly believe that there is little motivation for dietary changes."  Patient last seen by Dr. Collier Flowers MD with [facility] on 08/31/2018; cancelled f/u appt.  "Lori Carr was initially diagnosed with T2DM in 2007, she has been on MDI regimen since her diagnosis, she also was on Metforminat somepoint and was subsequently switched toDapagliflozin-metformin  (xigduo) which was stopped after hospitalization in 06/2018 for DKA. GAD-65 was elevated at  137 IU/mL which confirmed a diagnosis of T1DM . Her hemoglobin A1c has ranged from 7.9%in 2015, peaking at9.2%in 2020.Lori Carr was initially diagnosed with T2DM in 2007, she has been on MDI regimen since her diagnosis, she also was on Metforminat somepoint and was subsequently switched toDapagliflozin-metformin (xigduo) which was stopped after hospitalization in 06/2018 for DKA. GAD-65 was elevated at  137 IU/mL which confirmed a diagnosis of T1DM . Her hemoglobin A1c has ranged from 7.9%in 2015, peaking at9.2%in 2020."  A limited review of symptoms was performed, pertinent positives and negatives as mentioned in HPI.  The following portions of the patient's history were reviewed and updated as appropriate: allergies, current medications and past medical history.  Patient Active Problem List   Diagnosis Date Noted  . Hand pain 09/20/2018  . Neuropathy 09/03/2018  . Type 1 diabetes mellitus with hyperglycemia ([place]) 08/31/2018  . DKA (diabetic ketoacidoses) ([place]) 06/06/2018  . AKI (acute kidney injury) ([place]) 06/06/2018  . Postmenopausal bleeding 08/16/2017  . Dupuytren's contracture of foot 06/05/2013    Allergies  Allergen Reactions  . Amoxicillin-Pot Clavulanate Itching    Did it involve swelling of the face/tongue/throat, SOB, or low BP? No Did it involve sudden or severe rash/hives, skin peeling, or any reaction on the inside of your mouth or nose? No Did you need to seek medical attention at a hospital or doctor's office? No When did it last  happen?A long time ago If all above answers are "NO", may proceed with cephalosporin use.  . Vicodin [Hydrocodone-Acetaminophen] Nausea Only    Current Outpatient Medications on File Prior to Visit  Medication Sig Dispense Refill  . atorvastatin (LIPITOR) 10 MG tablet Take 1 tablet (10 mg total) by mouth daily. 90 tablet 3  . Continuous  Blood Gluc Receiver (DEXCOM G6 RECEIVER) DEVI 1 Device by Does not apply route as directed. 1 Device 0  . Continuous Blood Gluc Sensor (DEXCOM G6 SENSOR) MISC 1 Device by Does not apply route as directed. 3 each 11  . Continuous Blood Gluc Transmit (DEXCOM G6 TRANSMITTER) MISC 1 Device by Does not apply route as directed. 1 each 3  . insulin aspart (NOVOLOG) 100 UNIT/ML injection Inject 7 Units into the skin 3 (three) times daily before meals. Max daily 67 units (Patient taking differently: Inject into the skin 3 (three) times daily before meals. Inject 10-12 units three times a day) 20 mL 11  . Insulin Glargine (LANTUS SOLOSTAR) 100 UNIT/ML Solostar Pen Inject 26 Units into the skin daily. (Patient taking differently: Inject 30 Units into the skin at bedtime. ) 15 mL 3  . pantoprazole (PROTONIX) 40 MG tablet      No current facility-administered medications on file prior to visit.       Objective:   There were no vitals filed for this visit.   Wt Readings from Last 3 Encounters:  09/20/18 195 lb (88.5 kg)  09/03/18 192 lb (87.1 kg)  08/31/18 191 lb (86.6 kg)    Physical Exam:   General Appearance:  Patient sitting comfortably on examination table. Conversational. Peri [place] self-historian. In no acute distress. Afebrile.   Head:  Normocephalic, without obvious abnormality, atraumatic  Eyes:  PERRL, conjunctiva/corneas clear, EOM's intact  Nose: Nares normal. Septum midline. No visible polyps. No discharge. Normal mucosa. No sinus tenderness with percussion/palpation.  Throat: Lips, mucosa, and tongue normal; teeth and gums normal. Throat reveals no erythema. No postnasal drip. No visible cobblestoning. Tonsils with no enlargement or exudate. Uvula midline with no edema or erythema.  Neck: Supple, symmetrical, trachea midline, no adenopathy  Lungs:   Clear to auscultation bilaterally, respirations unlabored. Good aeration. No rales, rhonchi, crackles or wheezing.  Heart:  Regular rate and  rhythm, S1 and S2 normal, no murmur, rub, or gallop  Extremities: Extremities normal, atraumatic, no cyanosis or edema  Pulses: 2+ and symmetric  Skin: Skin color, texture, turgor normal, no rashes or lesions  Lymph nodes: Cervical, supraclavicular, and axillary nodes normal  Neurologic: Normal    Assessment & Plan:    Exam findings, diagnosis etiology and medication use and indications reviewed with patient. Follow-Up and discharge instructions provided. No emergent/urgent issues found on exam.  Patient education was provided.   Patient verbalized understanding of information provided and agrees with plan of care (POC), all questions answered. The patient is advised to call or return to clinic if condition does not see an improvement in symptoms, or to seek the care of the closest emergency department if condition worsens with the below plan.   Orders Placed This Encounter  Procedures  . Hemoglobin A1c    Results for orders placed or performed in visit on 10/15/18  Novel Coronavirus, NAA ([facility])   Specimen: Oropharyngeal(OP) collection in vial transport medium   OROPHARYNGEA  TESTING  Result Value Ref Range   SARS-CoV-2, NAA Detected (A) Not Detected    1. Type 2 diabetes mellitus with other specified complication, with  long-term current use of insulin (HCC)  - POCT HgB A1C (CPT 83036)  2. Tenosynovitis of right wrist  - diclofenac Sodium (VOLTAREN) 1 % GEL; Apply 2 g topically 4 (four) times daily.  Dispense: 100 g; Refill: 0 - naproxen (NAPROSYN) 500 MG tablet; Take 1 tablet (500 mg total) by mouth 2 (two) times daily with a meal.  Dispense: 28 tablet; Refill: 0  Discussed weight loss with patient. Discussed referral to medical weight loss center. Patient plans to call health insurance company for different options; including discount at a fitness center.  Patient scheduled her annual physical.  Patient with continued right wrist pain. Known tendonitis and carpal  tunnel. Plans on scheduling surgery soon. Prescribed Naproxen and Voltaren in the meantime. Discussed rest, brace use, ice/heat, massage, and stretching exercises.   Janalyn Harder, MHS, PA-C Rulon Sera, MHS, PA-C Advanced Practice Provider [facility]  [phone] [place], [place] [address] (p): [phone] [email or web address]

## 2019-02-11 NOTE — Patient Instructions (Signed)
Thank you for coming to the [facility] today.  1. Type 2 diabetes mellitus with other specified complication, with long-term current use of insulin (HCC) - POCT HgB A1C (CPT 83036)  Your A1C today was 7.7. Your last A1C in July 2020 was 8.7.  Recommend to continue diet and exercise changes.  2. Tenosynovitis of right wrist  - diclofenac Sodium (VOLTAREN) 1 % GEL; Apply 2 g topically 4 (four) times daily.  Dispense: 100 g; Refill: 0 - naproxen (NAPROSYN) 500 MG tablet; Take 1 tablet (500 mg total) by mouth 2 (two) times daily with a meal.  Dispense: 28 tablet; Refill: 0  Recommend wearing your wrist brace at all times; especially at night. May use ice, heat/warm compresses, and over-the-counter Bengay, or IcyHot, or BioFreeze. Gently massage area. Perform gentle stretching exercises.  May wish to follow-up with orthopedist.  Please schedule your annual physical.  [facility] [phone]  [facility] MD Address: [address] [place], [place] [address] Phone: [phone]  [facility] [address] [place], [place], [place] [address]. [phone]. [email or web address].  [facility] Located in: [facility] Address: [address] [place], [place], [place] [address] Phone: [phone] [email or web address]   [facility]  Address: [address], [place], [place] [address] Appointments: [email or web address] Phone: [phone]    Welcome to [facility] weight loss program and congratulations on making the decision to begin living a healthier life.  In order to achieve the most weight loss possible for your individual body, it is important that you follow the guidelines of the program.  Our goal is to assist you in losing and maintaining your weight in a manner that is healthy,  satisfying and will allow you to make healthy lifestyle changes you will follow the rest of your life.  There are no prepackaged foods or supplements that you will be required to purchased and all foods are from the grocery store and restaurants.   Our weight loss program will teach you how to improve your diet and help to modify your behavior to increase your success.  In addition to changing your diet, you will be asked to increase physical activity to aid in the weight loss process and help decrease cardiac risk factors.  Portion control of healthy foods along with increased physical activity on a daily basis  Is the main philosophy of our program and both need to beachieved in order to maintain lifelong success.   Many research studies have shown that the use of medications leads to temporary weight loss and that without permanent changes in your diet and level of physical activity, you will return to the previous weight.  Medications, or appetite suppressants, can help patients lose weight more quickly and can reduce  Fatigue that can sometimes come with dieting.  If you are unwilling to begin lifelong changes in your diet and level of physical activity, you should not be using medications for weigh loss.   At the first appointment, you will receive a personal consultation to design your individual weight loss needs and goals.  A program will then be developed specifically for your needs with detailed education regarding the healthy lifestyle changes that you will begin incorporating into your daily routine.  All patients will receive routine bolodwork to check cholesterol and glucose levels before beginning the program.  An EKG will also  be done at this time along with body measurements so that we can track a loss in inches as well as pounds.  For patients who are at risk for cardiovascular disease, a stress (treadmill) test will be performed.  Weight will be monitored once a week at our office.  In  addition, you will have a monthly appointment with the Nurse Practitioner to discuss weight loss success, evaluate any possible barriers you have experienced, and to monitor blood pressure and body measurements.   We look forward to helping you achieve your weight loss goal and a lifelong commitment to a healthy lifestyle!  Sincerely  Beverely Risen, MD  Restaurants  Eating out at restaurants has become a way of life for most of [place].  On an average basis, Americans eat more than 25% of their meals away from home.  When eating a meal at a restaurant, it is important to plan and think ahead about what healthy food choices are available. In addition, it is important to think that every meal out is a special occasion and to practice portion control.  Most restaurants serve portions that are 2-3 times bigger than what is considered normal.  There are several ways to control these portion sizes:  Share the meal with another person, have half of the meal bagged "to go" before eating, or eat half and leave the rest behind.  If the bread basket is your weakness, ask that it not be served.  Portion sizes also pertain to drinks. Water, diet soda, and unsweetened iced tea are the best calorie free beverages available. Multiple refills of regular soda and alcoholic beverages greatly increase total calorie intake.   The following items offer smart, low calore choice when eating at a restaurant as well as the appropriate serving sizes:  Fast Food: Fried chicken sandwich with lettuce and tomato, mayonnaise, cheese and [place] fries should be avoided.  Many salads at fast food restaurants have more calories and fat than a hamburger.  Make sure salad has grilled meat only with no cheese  Or nuts.  Fat free or light dressings should always be used.  [place] Food: 1 cup egg drop soup or 1 cup of Chinese vegetables with shrimp or tofin.  Avoid all fried food, including fried rice.  One half to 1 cup steamed white rice is also  acceptable.   [place] Food: 1 small bean burrito or 2 chicken fajitas without cheese, sour cream and guacamole.  Fat free or light sour cream is allowed.  [place] Food: Spaghetti with [employer] (1 cup spaghetti, 1/2 cup sauce).  Cram sauces (alfredo) and fried foods (chicken, eggplant, veal parmesan) should be avoided.  [place] Food: Sushi and teriyaki fish or chicken are healthy options served with steamed vegetables.  Brunch Buffet: 1 cup fruit salad, 1 cup oatmeal, 2 pancakes with light syrup, 1/2 cup scrambled eggs, toast with jelly.  Biscuits and gravy, bacon and sausage should be avoided.  [place] bacon is acceptable.  Commitment- Commitment is a critical part of any successful project and is the difference between failure and success.  It involves focusing on your goal seriously and using will power to get through the rough times.  This allows you to discover inner strengths and develop a confident and an in control new you.   Dieting is not an easy task and there are bound to be rough times.  However, if your commitment to yourself and your weight loss is strong, you will be successful in achieving  your goal.  Don't break your promise to yourself.    Diet Modification:  The main components of a healthy diet include 3 small meals with 3 healthy snakes throughout each day.  Your should not go more than 4 hours without eating a small snack or meal.  This helps avoid "starvation" and decreased the urge to choose unhealthy foods. The following guidelines should be followed through the day:  Focus on portion size and control   It is important to read nutrition labels so that you are aware of the appropriate serving size  For those foods that do not have a nutrition label, you can generally use your had as a guide for the appropriate servings sizes.  Fist 1 cup or medium whole fruit  Thumb (tip to base) 1 ounce of meat or chees  Thumb tip (tip to 1st joint) 1 tablespoon  Fingertip (tip  to 1st joint) 1 teaspoon  Cupped hand 1-2 ounces of nuts, pretzels, popcorn  Palm (minus fingers) 3 ounces of meat, fish or poultry    Eat breakfast daily to increase metabolism and prevent loss of control later in the day.  Research has repeatedly shown that patients who eat breakfast daily lose more weight and keep the weight off more effectively than patients who skip breakfast.  One serving of high fiber cereal (> 3 grams of fiber) with 1 cup of skim milk.  One serving of old fashioned oatmeal ( not instant) made with skim milk.  May add 1/2 cup chopped fruit an sugar substitute for added flavor.  Two egg omelet made with egg beaters. May add 1 tablespoon reduced fat cheese and unlimited vegetables.  One slice whole grain toast with 1 teaspoon Smart Beat margarine.  Avoid fruit juice since it has too many sugars and too little fiber. Coffee and tea fine with sugar substitute and skim or fat free dairy creamer.  Mid morning snack-choosing one of the following will help keep the craving under control.  One piece of fruit-apple, pear, banana, 1 cup of grapes  One half cup 2% cottage cheese  One container of fat free yogurt  One low fat mozzarella cheese stick  1 teaspoon natural peanut butter on celery sticks  1-2 cups air popped popcorn sprayed no more than twice with fat free margarine spray (May only have one per day)    Lunch  Start with an all vegetable salad ( no cheese or croutons with 1 teaspoon fat free or light dressing salad dressing.  Many patients find it convenient to eat a prepared meal such as Healthy [place], [employer], [employer], [employer].  It is important to alternate these prepared meals with homemade meals due to their high salt content.  See dinner options for homemade meal ideas.  May add 1/2 cup rice to meal if prepared meal is not adequate  Piece of fruit.  Mid Afternoon Snack-Same as mid morning snack  Dinner  May pick one serving from  each of the following categories (derived from [place] best selling book, :"Body for Live").  Start with an all vegetable salad (no cheese or croutons) with 1 teaspoon of fat free or light salad dressing or 1 cup low fat broth based soup Good Proteins (Palm-size portion 4oz) Good Carbohydrates (Fist-sized portion) Good Vegetables  Baked or grilled chicken breast Baked Potato Broccoli  [place] Breast Sweet Potato Asparagus  Lean ground [place] Yam Lettuce  Fish (baked, broiled, grilled, blackened allowed) [employer] rice ( 1/2-1 cup) Carrots  Tuna (Fresh or canned in water) [employer] Met[employer] CompanyWild rice (1/2-1 cup) Cauliflower  Crab Pasta ( 1 cup whole grain) Green Beans  Shrimp Oatmeal Green Peppers  Top round steak Beans(red, pinto, black) Mushrooms   Top sirloin steak Corn Advertising account executivepiniach  Lean ground beef Strawberries Tomato  Low-fat cottage cheese Whole wheat bread (1 slice regular 2 slice light) Artichoke    Cabbage    Zucchini    Cucumber    Onion    Prepare these foods using low-fat techniques such as grilling, baking, broiling, blackening meats and steaming vegetables.  Avoid canned vegetables and fruits to decrease sodium intake.  Fresh and froze produce offer the most nutritional value  Evening Snack or "Dessert"  1/2 cup skim milk with 1 package of No sugar added hot cocoa mix  1 serving of Weight Watchers frozen desserts, Fudgesicle bar, Skinny cow ice cream treats  1 cup Sugar Free Jell-O or instant pudding topped with 1 teaspoon fat free whipped topping    1-2 cups air popped popcorn sprayed no more than twice with Fat Free Margarine Spray (may only have once per day)  Water  Eight glasses of water daily (lemon, sugar substitute, and crystal light accepted)  All regular sodas and sweetened tea are NOT allowed.  This results in taking in too many empty calories.  Two diet sodas per day are allowed as are an unlimited amount of decaffeinated coffee and tea as long as sugar substitute  (Splenda, Sweet and Low, Equal) only are used for sweetening.  Water naturally suppresses the appetite and helps the body metabolize stored fat  Drinking enough water is the best treatment for fluid retention (unless you have been diagnosed with kidney deficiency).  When the body gets less water, it senses it as a threat and holds on to the water.  It is stored in places such as the feet, legs and hands.  By drinking plenty of water, the problem of water, the problem of water retention is solved and weight loss occurs.  The above diet modifications are based on the following principles:  25-35 grams of fiber daily (found in vegetables, whole grain, oats, beans and fruits)  Choose healthy carbohydrates that are rich in fiber, vitamins, and minerals (wheat bread vs white bread, wheat pasta vs regular pasta)  Healthy fats: Choose canola or olive oil if you must use oil to cook with.  Pam cooking spray is a good substitute.  Smart Choice margarine is a healthy alternative to other margarine and butter options.  Baked goods and fried foods should be avoided to decrease calorie intake harmful trans fatty acids and saturated fat absorb in the body.  Healthy Proteins: Americans consume far too much red meat (high amount of saturated fat). Healthier sources of protein are baked or broiled fish,poultry (without skin), beans, low-fat cottage cheese, egg whites and egg substitutes.  If you choose red meat, pick lean cuts such as top round, to sirloin or round,  The following will be implemented to help assist you in achieving successful weight loss on a weekly basis:  Food Diary: Keeping a record of all food you have eaten on a daily basis helps to make you aware of your food choices and helps determine the amount you have eaten.  It also serves as a great reference to look back to see what you did well on during the weeks you were most successful.  Weekly Meal Plan: Plan your meals and go grocery shopping  on a weekly  basis.  Those patients that plan their meals are far more successful on reaching their goals than other patients.  Without a plan, it is easy to fall back into old eating habits.  Weekly weigh in: Patients who report weekly for weight checks do better than those that try to diet without help.  Knowing your weight will be checked weekly helps keep you on track as well as staying motivated by the encouragement received on a weekly basis.    Exercise  You will need to begin exercising at least 5 days a week.  If you are just beginning an exercise program, start slowly at 15-20 minutes and gradually work up to a longer period of time.  If you are tolerating the exercise well you can increase by 5 minutes each day.  You may use any form of exercise you wish (walking, aerobics, treadmill, stair stepper, elliptical machine).  If you can walk two miles in 30-40 minutes,  You are at a good pace for a successful start.   Do not over do it!!  Make sure you can carry on a conversation with someone or able to whistle while you exercise.  Once you adjust your pace and are breathing normally, step up your pace as you can tolerate it.   The following facilities in [place] area offer gym memberships:  [employer] gym: Only $9.00 a month  [employer]: [phone]  [facility] [facility]: [phone] ($29.00 per month)  [facility] [phone]  Medication  When medically appropriate, you will be prescribed a medication to control your appetite.  This will allow you to help follow the dietary recommendations.  Only one prescription for a 30 day supply can be given per visit, so do not misplace your prescription or medication.  It is important to remember that the purpose of the medication is to help bring your appetite under control and will be used for a short time.  You need to change your daily habits if you are to be successful in this program. If side effects such as severe headaches, nausea,  and vomiting or skin rash develop, discontinue your medication and call your healthcare provider.

## 2019-02-28 DIAGNOSIS — M654 Radial styloid tenosynovitis [de Quervain]: Secondary | ICD-10-CM | POA: Diagnosis not present

## 2019-03-18 ENCOUNTER — Telehealth: Payer: Self-pay | Admitting: Internal Medicine

## 2019-03-18 NOTE — Telephone Encounter (Signed)
Lanora Manis with [facility] called asking if we had gotten paperwork for request for medical records. Patient is scheduled to have surgery on 03/29/19 and needs it before then. Lanora Manis states the paperwork was faxed to [place] in January.  Fax# [phone]

## 2019-03-18 NOTE — Telephone Encounter (Signed)
Lft vm for Lori Carr to call back to inform her that any medical records request would have been faxed to medical records and to give her the number to reach them at [phone].

## 2019-03-18 NOTE — Progress Notes (Signed)
Patient comes in today for pre physical labs and EKG. Patient is scheduled for a physical with Westley Gambles, PA-C on 03/27/2019.

## 2019-03-19 ENCOUNTER — Ambulatory Visit: Payer: Managed Care, Other (non HMO)

## 2019-03-19 ENCOUNTER — Other Ambulatory Visit: Payer: Self-pay

## 2019-03-19 DIAGNOSIS — Z794 Long term (current) use of insulin: Secondary | ICD-10-CM

## 2019-03-19 DIAGNOSIS — E1169 Type 2 diabetes mellitus with other specified complication: Secondary | ICD-10-CM

## 2019-03-19 DIAGNOSIS — Z0000 Encounter for general adult medical examination without abnormal findings: Secondary | ICD-10-CM

## 2019-03-19 LAB — POCT URINALYSIS DIPSTICK
Bilirubin, UA: NEGATIVE
Blood, UA: NEGATIVE
Glucose, UA: POSITIVE — AB
Ketones, UA: NEGATIVE
Leukocytes, UA: NEGATIVE
Nitrite, UA: NEGATIVE
Protein, UA: NEGATIVE
Spec Grav, UA: 1.025 (ref 1.010–1.025)
Urobilinogen, UA: 0.2 E.U./dL
pH, UA: 6 (ref 5.0–8.0)

## 2019-03-21 LAB — MICROALBUMIN / CREATININE URINE RATIO
Creatinine, Urine: 120 mg/dL
Microalb/Creat Ratio: 3 mg/g creat (ref 0–29)
Microalbumin, Urine: 3.9 ug/mL

## 2019-03-21 LAB — CMP12+LP+TP+TSH+6AC+CBC/D/PLT
ALT: 11 IU/L (ref 0–32)
AST: 12 IU/L (ref 0–40)
Albumin/Globulin Ratio: 1.5 (ref 1.2–2.2)
Albumin: 4 g/dL (ref 3.8–4.9)
Alkaline Phosphatase: 139 IU/L — ABNORMAL HIGH (ref 39–117)
BUN/Creatinine Ratio: 24 — ABNORMAL HIGH (ref 9–23)
BUN: 18 mg/dL (ref 6–24)
Basophils Absolute: 0.1 10*3/uL (ref 0.0–0.2)
Basos: 1 %
Bilirubin Total: 0.4 mg/dL (ref 0.0–1.2)
Calcium: 9.3 mg/dL (ref 8.7–10.2)
Chloride: 102 mmol/L (ref 96–106)
Chol/HDL Ratio: 3.1 ratio (ref 0.0–4.4)
Cholesterol, Total: 209 mg/dL — ABNORMAL HIGH (ref 100–199)
Creatinine, Ser: 0.76 mg/dL (ref 0.57–1.00)
EOS (ABSOLUTE): 0.2 10*3/uL (ref 0.0–0.4)
Eos: 3 %
Estimated CHD Risk: <0.5 times avg. (ref 0.0–1.0)
Free Thyroxine Index: 2 (ref 1.2–4.9)
GFR calc Af Amer: 104 mL/min/{1.73_m2} (ref >59)
GFR calc non Af Amer: 90 mL/min/{1.73_m2} (ref >59)
GGT: 7 IU/L (ref 0–60)
Globulin, Total: 2.6 g/dL (ref 1.5–4.5)
Glucose: 210 mg/dL — ABNORMAL HIGH (ref 65–99)
HDL: 67 mg/dL (ref >39)
Hematocrit: 48 % — ABNORMAL HIGH (ref 34.0–46.6)
Hemoglobin: 15.9 g/dL (ref 11.1–15.9)
Immature Grans (Abs): 0 10*3/uL (ref 0.0–0.1)
Immature Granulocytes: 1 %
Iron: 103 ug/dL (ref 27–159)
LDH: 203 IU/L (ref 119–226)
LDL Chol Calc (NIH): 126 mg/dL — ABNORMAL HIGH (ref 0–99)
Lymphocytes Absolute: 2.2 10*3/uL (ref 0.7–3.1)
Lymphs: 36 %
MCH: 28.3 pg (ref 26.6–33.0)
MCHC: 33.1 g/dL (ref 31.5–35.7)
MCV: 85 fL (ref 79–97)
Monocytes Absolute: 0.5 10*3/uL (ref 0.1–0.9)
Monocytes: 8 %
Neutrophils Absolute: 3.2 10*3/uL (ref 1.4–7.0)
Neutrophils: 51 %
Phosphorus: 4.2 mg/dL (ref 3.0–4.3)
Platelets: 277 10*3/uL (ref 150–450)
Potassium: 4.4 mmol/L (ref 3.5–5.2)
RBC: 5.62 x10E6/uL — ABNORMAL HIGH (ref 3.77–5.28)
RDW: 13.3 % (ref 11.7–15.4)
Sodium: 139 mmol/L (ref 134–144)
T3 Uptake Ratio: 26 % (ref 24–39)
T4, Total: 7.8 ug/dL (ref 4.5–12.0)
TSH: 2.29 u[IU]/mL (ref 0.450–4.500)
Total Protein: 6.6 g/dL (ref 6.0–8.5)
Triglycerides: 89 mg/dL (ref 0–149)
Uric Acid: 2.9 mg/dL — ABNORMAL LOW (ref 3.0–7.2)
VLDL Cholesterol Cal: 16 mg/dL (ref 5–40)
WBC: 6.2 10*3/uL (ref 3.4–10.8)

## 2019-03-21 LAB — MAGNESIUM: Magnesium: 2 mg/dL (ref 1.6–2.3)

## 2019-03-27 ENCOUNTER — Other Ambulatory Visit: Payer: Self-pay

## 2019-03-27 ENCOUNTER — Ambulatory Visit: Payer: Managed Care, Other (non HMO) | Admitting: Registered Nurse

## 2019-03-27 VITALS — BP 114/70 | HR 78 | Temp 97.6°F | Ht 62.0 in | Wt 217.4 lb

## 2019-03-27 DIAGNOSIS — Z20822 Contact with and (suspected) exposure to covid-19: Secondary | ICD-10-CM | POA: Diagnosis not present

## 2019-03-27 DIAGNOSIS — E1065 Type 1 diabetes mellitus with hyperglycemia: Secondary | ICD-10-CM

## 2019-03-27 DIAGNOSIS — Z01812 Encounter for preprocedural laboratory examination: Secondary | ICD-10-CM | POA: Diagnosis not present

## 2019-03-27 DIAGNOSIS — R748 Abnormal levels of other serum enzymes: Secondary | ICD-10-CM

## 2019-03-27 DIAGNOSIS — M654 Radial styloid tenosynovitis [de Quervain]: Secondary | ICD-10-CM | POA: Diagnosis not present

## 2019-03-27 DIAGNOSIS — Z6839 Body mass index (BMI) 39.0-39.9, adult: Secondary | ICD-10-CM

## 2019-03-27 DIAGNOSIS — M1712 Unilateral primary osteoarthritis, left knee: Secondary | ICD-10-CM

## 2019-03-27 DIAGNOSIS — E785 Hyperlipidemia, unspecified: Secondary | ICD-10-CM

## 2019-03-27 DIAGNOSIS — Z0000 Encounter for general adult medical examination without abnormal findings: Secondary | ICD-10-CM

## 2019-03-27 DIAGNOSIS — M659 Synovitis and tenosynovitis, unspecified: Secondary | ICD-10-CM

## 2019-03-27 MED ORDER — ATORVASTATIN CALCIUM 10 MG PO TABS: 10.0000 mg | ORAL_TABLET | ORAL | 0 refills | Status: DC

## 2019-03-27 NOTE — Progress Notes (Signed)
Subjective:    Patient ID: Lori Carr, female    DOB: [birth year], 54 y.o.   MRN: [id number]  53y/o caucasian female established patient here for annual physical.  Patient does not have any concerns today.  Reviewed diet: patient typically drinking diet coke or pepsi or mio flavoring to water.  Has stopped all sodas after DKA hospitalization last year.  Was covid positive on sep 2020.  Does not want vaccine flu or covid.  Using insulin sliding scale.  Unsure that she had eye exam last year--due.  Sees dentist every 6 months Dr Roxan Hockey did her last foot exam in 2020.  Wears apple watch and iphone  Steps watch 2000-3000 per day over the past year; phone states 4700 average for annual steps  Tends to have over 5000 in the summer; restarted lipitor last week having loose stools again she thinks that is why she stopped it the last time.  Noted hand swelling ams--wearing carpal tunnel braces to sleep; surgery scheduled with orthopedics right wrist Friday this week; left hand more numbness/frequency/occurence increasing now trying to avoid surgery left; right hand dominant and thinks has been overusing left compensating and why symptoms worsening left.  Diclofenac okay doesn't need refill at this time last filled Jan 2021  No period since endometrial ablation 07/03/2018  Not taking any supplements/vitamins  Sinus tachycardia resolved per patient hasn't done that in the previous year and back pain unchanged/she does not have any concerns at this time.  Denied loss of bowel/bladder control, saddle paresthesias or arm/leg weakness with back pain.  Checking her blood sugars at home usually BID -TID  Not always fasting am performed.  Last mammogram June 2020.  All rashes since last physical have resolved  04/11/2018 with Dr Roxan Hockey  PMHx per paper chart Bilateral carpal tunnel syndrome 2020, right dequervains tenosynovitis 2020, left stage 2 basal joint arthritis, Type I diabetes endocrinology 07/2018, elevated  LDL , anxiety stress, DDD lumbar, left arm pain, DKA, AKI, chronic vaginal bleeding, hypokalemia, sinus tachycardia Patient not checking blood sugars every morning but twice a day 120s-280s.  496 last week--ran out of insulin with her and ate japanese thinks soy and rice elevated her sugars; no sliding scale until she got home 7 units.  Patient has noted from endocrinology dated 07/09/2018 based on elevated GAD-65 antibody titer patient with Type I diabetes and SGLT-2 inhibitors are not a good option due to increased risk of DKA  Consider restarting metformin in the future  Glutaminic acid decarboxylase auto abs 137   Review of Systems  Constitutional: Positive for activity change. Negative for appetite change, chills, diaphoresis, fatigue and fever.  HENT: Negative for ear discharge, ear pain, facial swelling, mouth sores, sinus pressure, sinus pain, trouble swallowing and voice change.   Eyes: Negative for photophobia, pain, discharge, redness, itching and visual disturbance.  Respiratory: Negative for cough, shortness of breath, wheezing and stridor.   Cardiovascular: Positive for leg swelling. Negative for chest pain and palpitations.  Gastrointestinal: Negative for abdominal pain, constipation, nausea and vomiting.  Endocrine: Negative for cold intolerance, heat intolerance, polydipsia, polyphagia and polyuria.  Genitourinary: Negative for difficulty urinating, dyspareunia, dysuria and vaginal bleeding.  Musculoskeletal: Positive for arthralgias and myalgias. Negative for back pain, gait problem, joint swelling, neck pain and neck stiffness.  Skin: Negative for color change and rash.  Allergic/Immunologic: Positive for immunocompromised state. Negative for environmental allergies and food allergies.  Neurological: Negative for dizziness, tremors, seizures, syncope, facial asymmetry, speech difficulty, weakness, light-headedness,  numbness and headaches.  Hematological: Negative for adenopathy.  Does not bruise/bleed easily.  Psychiatric/Behavioral: Negative for agitation, confusion and sleep disturbance.       Objective:   Physical Exam Vitals and nursing note reviewed.  Constitutional:      General: She is awake. She is not in acute distress.    Appearance: Normal appearance. She is well-developed and well-groomed. She is obese. She is not ill-appearing, toxic-appearing or diaphoretic.  HENT:     Head: Normocephalic and atraumatic.     Jaw: There is normal jaw occlusion. No trismus.     Salivary Glands: Right salivary gland is not diffusely enlarged or tender. Left salivary gland is not diffusely enlarged or tender.     Right Ear: Hearing and external ear normal. A middle ear effusion is present.     Left Ear: Hearing and external ear normal. A middle ear effusion is present.     Nose: Nose normal. No nasal deformity, septal deviation, laceration, congestion or rhinorrhea.     Mouth/Throat:     Lips: Pink. No lesions.     Mouth: Mucous membranes are not pale, not dry and not cyanotic. No lacerations or angioedema.     Pharynx: Oropharynx is clear.  Eyes:     General: Lids are normal. Vision grossly intact. Gaze aligned appropriately. No allergic shiner, visual field deficit or scleral icterus.       Right eye: No foreign body, discharge or hordeolum.        Left eye: No foreign body, discharge or hordeolum.     Extraocular Movements: Extraocular movements intact.     Right eye: Normal extraocular motion and no nystagmus.     Left eye: Normal extraocular motion and no nystagmus.     Conjunctiva/sclera: Conjunctivae normal.     Right eye: Right conjunctiva is not injected. No chemosis, exudate or hemorrhage.    Left eye: Left conjunctiva is not injected. No chemosis, exudate or hemorrhage.    Pupils: Pupils are equal, round, and reactive to light. Pupils are equal.     Right eye: Pupil is round and reactive.     Left eye: Pupil is round and reactive.  Neck:     Thyroid:  No thyroid mass, thyromegaly or thyroid tenderness.     Vascular: No carotid bruit.     Trachea: Trachea and phonation normal. No tracheal tenderness or tracheal deviation.  Cardiovascular:     Rate and Rhythm: Normal rate and regular rhythm.     Chest Wall: PMI is not displaced.     Pulses: Normal pulses.          Radial pulses are 2+ on the right side and 2+ on the left side.     Heart sounds: Normal heart sounds, S1 normal and S2 normal. Heart sounds not distant. No murmur. No friction rub. No gallop.   Pulmonary:     Effort: Pulmonary effort is normal. No accessory muscle usage or respiratory distress.     Breath sounds: Normal breath sounds and air entry. No stridor, decreased air movement or transmitted upper airway sounds. No decreased breath sounds, wheezing, rhonchi or rales.     Comments: After cough forced BBS CTA inspiratory crackle end inspiratory wheeze noted LLL exam initially; wearing cloth mask due to covid 19 pandemic; spoke full sentences without difficulty Chest:     Chest wall: No tenderness.  Abdominal:     General: Abdomen is flat. Bowel sounds are normal. There is no distension or  abdominal bruit. There are no signs of injury.     Palpations: Abdomen is soft. There is no shifting dullness, fluid wave, splenomegaly, mass or pulsatile mass.     Tenderness: There is no abdominal tenderness. There is no right CVA tenderness, left CVA tenderness, guarding or rebound. Negative signs include Murphy's sign.     Hernia: No hernia is present. There is no hernia in the umbilical area or ventral area.     Comments: Dull to percussion x 4 quads; normoactive bowel sounds x  4 days  Musculoskeletal:        General: Swelling and tenderness present. No deformity.     Right shoulder: Normal.     Left shoulder: Normal.     Right elbow: Normal.     Left elbow: Normal.     Right wrist: Tenderness present. No swelling, deformity, effusion or lacerations. Decreased range of motion.      Left wrist: Tenderness present. No swelling, deformity, effusion or lacerations. Decreased range of motion.     Right hand: Normal.     Left hand: Normal.     Cervical back: Normal, normal range of motion and neck supple. No swelling, edema, deformity, erythema, signs of trauma, lacerations, rigidity, spasms, torticollis, tenderness or crepitus. No pain with movement, spinous process tenderness or muscular tenderness. Normal range of motion.     Thoracic back: Normal.     Lumbar back: Normal.     Right hip: Normal.     Left hip: Normal.     Right knee: Normal.     Left knee: Normal.     Right lower leg: 1+ Pitting Edema present.     Left lower leg: 1+ Pitting Edema present.  Lymphadenopathy:     Head:     Right side of head: No submental, submandibular, tonsillar, preauricular, posterior auricular or occipital adenopathy.     Left side of head: No submental, submandibular, tonsillar, preauricular, posterior auricular or occipital adenopathy.     Cervical: No cervical adenopathy.     Right cervical: No superficial, deep or posterior cervical adenopathy.    Left cervical: No superficial, deep or posterior cervical adenopathy.  Skin:    General: Skin is warm and dry.     Capillary Refill: Capillary refill takes less than 2 seconds.     Coloration: Skin is not ashen, cyanotic, jaundiced, mottled, pale or sallow.     Findings: No abrasion, abscess, acne, bruising, burn, ecchymosis, erythema, signs of injury, laceration, lesion, petechiae, rash or wound.     Nails: There is no clubbing.  Neurological:     General: No focal deficit present.     Mental Status: She is alert and oriented to person, place, and time. Mental status is at baseline. She is not disoriented.     GCS: GCS eye subscore is 4. GCS verbal subscore is 5. GCS motor subscore is 6.     Cranial Nerves: Cranial nerves are intact. No cranial nerve deficit, dysarthria or facial asymmetry.     Sensory: Sensation is intact. No sensory  deficit.     Motor: Motor function is intact. No weakness, tremor, atrophy, abnormal muscle tone or seizure activity.     Coordination: Coordination is intact. Coordination normal.     Gait: Gait is intact. Gait normal.     Comments: Gait sure and steady in hallway; on/off exam table and in/out of chair without difficulty; bilateral hand grasp equal 5/5 along with upper and lower extremity strength  Psychiatric:  Attention and Perception: Attention and perception normal.        Mood and Affect: Mood and affect normal.        Speech: Speech normal.        Behavior: Behavior normal. Behavior is cooperative.        Thought Content: Thought content normal.        Cognition and Memory: Cognition and memory normal.        Judgment: Judgment normal.    Reviewed labs with patient and given printed copy of lab results with plan of care instructions  See results note.Your blood sugars are improving. You have one liver function test alkaline phosphatase that is elevated along with total and LDL cholesterol Anemia resolved and RBCs slightly elevated after menstrual bleeding completely ceased.  Will recheck cholesterol once HgbA1c further improved closer to 7 and if still elevated at that time will consider adjusting your lipitor dose. Your electrolytes, kidney function, thyroid and iron levels normal. Avoid dehdyration, drink water to keep urine clear yellow tinged and urinating every 4 hours while awake.  EKG sinus rhythm no changes when compared to EKGs on file in paper chart historical.  Patient verbalized understanding information/instructions and had no further questions at this time. Results for CADIENCE, BRADFIELD (MRN [id number]) as of 03/28/2019 23:07  Ref. Range 02/11/2019 11:08 03/19/2019 09:05  Sodium Latest Ref Range: 134 - 144 mmol/L  139  Potassium Latest Ref Range: 3.5 - 5.2 mmol/L  4.4  Chloride Latest Ref Range: 96 - 106 mmol/L  102  Glucose Latest Ref Range: 65 - 99 mg/dL  210 (H)   BUN Latest Ref Range: 6 - 24 mg/dL  18  Creatinine Latest Ref Range: 0.57 - 1.00 mg/dL  0.76  Calcium Latest Ref Range: 8.7 - 10.2 mg/dL  9.3  BUN/Creatinine Ratio Latest Ref Range: 9 - 23   24 (H)  Phosphorus Latest Ref Range: 3.0 - 4.3 mg/dL  4.2  Magnesium Latest Ref Range: 1.6 - 2.3 mg/dL  2.0  Alkaline Phosphatase Latest Ref Range: 39 - 117 IU/L  139 (H)  Albumin Latest Ref Range: 3.8 - 4.9 g/dL  4.0  Albumin/Globulin Ratio Latest Ref Range: 1.2 - 2.2   1.5  Uric Acid Latest Ref Range: 3.0 - 7.2 mg/dL  2.9 (L)  AST Latest Ref Range: 0 - 40 IU/L  12  ALT Latest Ref Range: 0 - 32 IU/L  11  Total Protein Latest Ref Range: 6.0 - 8.5 g/dL  6.6  Total Bilirubin Latest Ref Range: 0.0 - 1.2 mg/dL  0.4  GGT Latest Ref Range: 0 - 60 IU/L  7  GFR, Est Non African American Latest Ref Range: >59 mL/min/1.73  90  GFR, Est African American Latest Ref Range: >59 mL/min/1.73  104  Estimated CHD Risk Latest Ref Range: 0.0 - 1.0 times avg.  < 0.5  LDH Latest Ref Range: 119 - 226 IU/L  203  Total CHOL/HDL Ratio Latest Ref Range: 0.0 - 4.4 ratio  3.1  Cholesterol, Total Latest Ref Range: 100 - 199 mg/dL  209 (H)  HDL Cholesterol Latest Ref Range: >39 mg/dL  67  MICROALB/CREAT RATIO Latest Ref Range: 0 - 29 mg/g creat  3  Triglycerides Latest Ref Range: 0 - 149 mg/dL  89  VLDL Cholesterol Cal Latest Ref Range: 5 - 40 mg/dL  16  LDL Chol Calc (NIH) Latest Ref Range: 0 - 99 mg/dL  126 (H)  Iron Latest Ref Range: 27 - 159 ug/dL  103  Globulin, Total Latest Ref Range: 1.5 - 4.5 g/dL  2.6  WBC Latest Ref Range: 3.4 - 10.8 x10E3/uL  6.2  RBC Latest Ref Range: 3.77 - 5.28 x10E6/uL  5.62 (H)  Hemoglobin Latest Ref Range: 11.1 - 15.9 g/dL  15.9  HCT Latest Ref Range: 34.0 - 46.6 %  48.0 (H)  MCV Latest Ref Range: 79 - 97 fL  85  MCH Latest Ref Range: 26.6 - 33.0 pg  28.3  MCHC Latest Ref Range: 31.5 - 35.7 g/dL  33.1  RDW Latest Ref Range: 11.7 - 15.4 %  13.3  Platelets Latest Ref Range: 150 - 450  x10E3/uL  277  Neutrophils Latest Ref Range: Not Estab. %  51  Immature Granulocytes Latest Ref Range: Not Estab. %  1  NEUT# Latest Ref Range: 1.4 - 7.0 x10E3/uL  3.2  Lymphocyte # Latest Ref Range: 0.7 - 3.1 x10E3/uL  2.2  Monocytes Absolute Latest Ref Range: 0.1 - 0.9 x10E3/uL  0.5  Basophils Absolute Latest Ref Range: 0.0 - 0.2 x10E3/uL  0.1  Immature Grans (Abs) Latest Ref Range: 0.0 - 0.1 x10E3/uL  0.0  Lymphs Latest Ref Range: Not Estab. %  36  Monocytes Latest Ref Range: Not Estab. %  8  Basos Latest Ref Range: Not Estab. %  1  Eos Latest Ref Range: Not Estab. %  3  EOS (ABSOLUTE) Latest Ref Range: 0.0 - 0.4 x10E3/uL  0.2  Hemoglobin A1C Latest Ref Range: 4.0 - 5.6 % 7.7 (A)   TSH Latest Ref Range: 0.450 - 4.500 uIU/mL  2.290  Thyroxine (T4) Latest Ref Range: 4.5 - 12.0 ug/dL  7.8  Free Thyroxine Index Latest Ref Range: 1.2 - 4.9   2.0  T3 Uptake Ratio Latest Ref Range: 24 - 39 %  26   Reviewed [facility] [place] paper chart for historical lab results: HgbA1c 05/2011 7.9 07/2013 9.2 05/2014 9.6 01/2015 9.3 04 2017 9.3 10/2015 HgbA1c 6.8 01/2016 7.3 05/2016 6.6 05/2016 7.5 12/2016 8.0 03/2017 7.9 07/2017 8.2 10/2017 11 12/2017 7.7 03 2020 8.9 Lipids 05/06/2011 183/103/56/106; 05/2016 total 181 trigs 69 HDL 59 LDL 108 04/04/2018 186/71/63/109 04/2018 CBC  RBC5.31 Hgb 14.7 Hct 44.9 04/2018 creatinine urine 111.6 albumin 30.7 ratio 28 normal     Assessment & Plan:  A-annual physical without gynecological exam; type I diabetes on insulin; BMI 39.76, right tenosynovitis, left carpal tunnel syndrome, leg swelling, elevated alkaline phosphatase, dyslipidemia  P-refused influenza vaccination.  Discussed covid vaccination patient had positive test 10/15/2018 discussed could get vaccine 3 months after as antibodies have been shown to wane in some persons after 3 months to prevent reinfection/possible hospitalization.  Discussed we are still learning how long antibodies stay in most patients  systems.  A study out of [place] on [address] patients showing 80% with antibodies at 5 months.  Discussed variant strains are in [place] infection from previous strain may not provide protection e.g. Flu A versus B infection can get flu multiple times per year/different strain.  Discussed with patient monoclonal antibody infusions available at [facility] now if she is reinfected to help prevent severe disease/hospitalization and with her risk factors I would recommend infusion if she has testing performed at [facility] and positive again to contact clinic staff if she would like to receive monoclonal antibody infusion as outpatient. Last TDAP 2018 Follow up with GYN annually and mammogram due Jun 2021.  Follow up 1 year.  Patient verbalized understanding information/instructions, agreed with plan of  care and had no further questions at this time.    Last HgbA1c 7.07 February 2019 .  Next due Apr 2021 patient to schedule  Patient reported nonnutritional sugar intake increased over the holidays.  She has been modifying diet but not exercising.  Reviewed apple watch and phone step data.  Discussed increasing steps 250 per day/standing up at desk at work hourly to step for one minute and once weather nicer building up to 15 minute walk outside.  Eventual goal 10,000 steps per day in 1 year.  Consider pool treading/aerobics/swimming during summer as would help to offload weight from left knee or bicycling also. Follow up in 2 months repeat HgbA1c Apr 2021.  Ensure staying hydrated drink water to keep urine pale yellow clear and voiding every 4 hours when awake.  Avoid dehydration.  Patient reported overdue for optometry appt none last year and encouraged her to schedule appt Patient refused dietitian/nutritional consult at this time. Avoid added sugars in diet/non-nutritional greater than 25gms/5 teaspoons per day.  Exitcare handout diabetes basics, diabetes and exercise, diabetes nutrition and foot care printed and  given to patient.  Notify optometry provider diabetes diagnosis.  Also notify dentist of diabetes diagnosis as more frequent cleanings/services may be covered by insurance with diabetes.  Encouraged weight loss.  Apply emollient to feet each night e.g. vaseline/aquaphor/eucerin (squeeze or scoop container not pump as pump has alcohol in it and more drying).  Do visual foot inspection every day when putting on socks/shoes check for redness/sores.  Discussed high blood sugars can affect nerve function/altered sensation and may not noticed hot spots (red areas)/blisters or cuts especially between toes.  Patient verbalized understanding information/instructions, agreed with plan of care and had no further questions at this time.    Has met with dietitian after hospitalization 2020 for DKA.  She reported she will review her information again to help her get back on track with diet and exercise.  BMI 39.  HgbA1c improving.  HDL still low LDL above 70 but patient had stopped taking lipitor also.  Will need at least 3 month continuous lipitor dosing before repeat lipids.  Spring will be here next month with better weather to get outside and longer hours of daylight. Change lipitor to every other day just recently filled.  If patient tolerating every other day will input new Rx at follow up.  If continuing to have loose stools try M/Th dosing if still loose weekly dosing.  Discussed with patient even weekly dosing has shown cardiovascular benefit and risk reduction of stroke/heart attack.   Discussed lifestyle modification re diet to decrease white starches/sugars e.g. potatoes/bread increase omega 3 sources nuts, fish options add more fruits and vegetables and lean protein choices such as hummus or yogurt. Patient given exitcare handout on health risks of being overweightl. Discussed weight loss.  Patient verbalized understanding information/instructions, agreed with plan of care and had no further questions at this  time.  Orthopedics has surgery scheduled this Friday for right wrist tenosynovitis.  Left bothering her more since right handed and now compensating with left but has been painful so using left more flaring carpal tunnel symptoms.  Encouraged her to continue brace wear and voltaren gel use wrist/knees.  Helps some per patient.  Cryotherapy 15 minutes/elevate after work/stretches.  Patient denied need for voltaren gel refill at this time.  Follow up as scheduled with orthopedics.  Patient verbalized understanding information/instructions, agreed with plan of care and had no further questions at this time.  Elevated alk phos repeat LFTs with next HgbA1c  [facility] results [place] paper chart dated 04/04/2018 alk phos 85 normal LDH 166 AST 11 ALT 11 GGT 6  Discussed probably related to chronic disease will continue to monitor. Denied skin color changes/n/v/abdomen pain.  Encouraged weight loss.   Exitcare handout on liver function tests printed and given to patient.  Patient verbalized understanding information/instructions, agreed with plan of care and had no further questions at this time.  Leg swelling consider compression socks/elevating feet when sitting/weight loss and activity 150 minutes per week.  Patient verbalized understanding and had no further questions at this time.

## 2019-03-27 NOTE — Patient Instructions (Addendum)
Preventing Health Risks of Being Overweight Maintaining a healthy body weight is an important part of your overall health. Your healthy body weight depends on your age, gender, and height. Being overweight puts you at risk for many health problems, including:  Heart disease.  Diabetes.  Problems sleeping.  Joint problems. You can make changes to your diet and lifestyle to prevent these risks. Consider working with a health care provider or a dietitian to make these changes. What nutrition changes can be made?   Eat only as much as your body needs. In most cases, this is about 2,000 calories a day, but the amount varies depending on your height, gender, and activity level. Ask your health care provider how many calories you should have each day. Eating more than your body needs on a regular basis can cause you to become overweight or obese.  Eat slowly, and stop eating when you feel full.  Choose healthy foods, including: ? Fruits and vegetables. ? Lean meats. ? Low-fat dairy products. ? High-fiber foods, such as whole grains and beans. ? Healthy snacks like vegetable sticks, a piece of fruit, or a small amount of yogurt or cheese.  Avoid foods and drinks that are high in sugar, salt (sodium), saturated fat, or trans fat. This includes: ? Many desserts such as candy, cookies, and ice cream. ? Soda. ? Fried foods. ? Processed meats such as hot dogs or lunch meats. ? Prepackaged snack foods. What lifestyle changes can be made?   Exercise for at least 150 minutes a week to prevent weight gain, or as often as recommended by your health care provider. Do moderate-intensity exercise, such as brisk walking. ? Spread it out by exercising for 30 minutes 5 days a week, or in short 10-minute bursts several times a day.  Find other ways to stay active and burn calories, such as yard work or a hobby that involves physical activity.  Get at least 8 hours of sleep each night. When you are  well-rested, you are more likely to be active and make healthy choices during the day. To sleep better: ? Try to go to bed and wake up at about the same time every day. ? Keep your bedroom dark, quiet, and cool. ? Make sure that your bed is comfortable. ? Avoid stimulating activities, such as watching television or exercising, for at least one hour before bedtime. Why are these changes important? Eating healthy and being active helps you lose weight and prevent health problems caused by being overweight. Making these changes can also help you manage stress, feel better mentally, and connect with friends and family. What can happen if changes are not made? Being overweight can affect you for your entire life. You may develop joint or bone problems that make it painful or difficult for you to play sports or do activities you enjoy. Being overweight puts stress on your heart and lungs and can lead to medical problems like diabetes, heart disease, and sleeping problems. Where to find support You can get support for preventing health risks of being overweight from:  Your health care provider or a dietitian. They can provide guidance about healthy eating and healthy lifestyle choices.  Weight loss support groups, online or in-person. Where to find more information  MyPlate: FormerBoss.no ? This an online tool that provides personalized recommendations about foods to eat each day.  The Centers for Disease Control and Prevention: http://sharp-hammond.biz/ ? This resource gives tips for managing weight and having an active lifestyle.  Summary  To prevent unhealthy weight gain, it is important to maintain a healthy diet high in vegetables and whole grains, exercise regularly, and get at least 8 hours of sleep each night.  Making these changes helps prevent many long-term (chronic) health conditions that can shorten your life, such as diabetes, heart disease, and stroke. This information is  not intended to replace advice given to you by your health care provider. Make sure you discuss any questions you have with your health care provider. Document Revised: 10/10/2018 Document Reviewed: 12/14/2016 Elsevier Patient Education  2020 [employer] Liver Function Tests Why am I having this test? Liver function tests are done to see how well your liver is working. The proteins and enzymes measured in the test can alert your health care provider to inflammation, damage, or disease in your liver. It is common to have liver function tests:  When you are taking certain medicines.  If you have liver disease.  If you drink a lot of alcohol.  When you are not feeling well.  When you have other conditions that may affect your liver.  During annual physical exams.  If you have symptoms such as yellowing of the skin (jaundice), abdominal pain, or nausea and vomiting. What is being tested? These tests measure various substances in your blood. This may include:  Alanine transaminase (ALT). This is an enzyme in the liver.  Aspartate transaminase (AST). This is an enzyme in the liver, heart, and muscles.  Alkaline phosphatase (ALP). This is a protein in the liver, bile ducts, bone, and other body tissues.  Total bilirubin. This is a yellow pigment in bile.  Albumin. This is a protein in the liver.  Prothrombin time and international normalized ratio (PT and INR). PT measures the time it takes for your blood to clot. INR is a calculation of blood clotting time based on your PT result. It is also calculated based on normal ranges defined by the lab that processed your test.  Total protein. This includes two proteins, albumin and globulin, found in the blood. What kind of sample is taken?  A blood sample is required for this test. It is usually collected by inserting a needle into a blood vessel. How do I prepare for this test? How you prepare will depend on which tests are being done  and the reason for doing them. You may need to:  Avoid eating for 4-6 hours before the test, or as told by your health care provider.  Stop taking certain medicines before your blood test, as told by your health care provider. Tell a health care provider about:  All medicines you are taking, including vitamins, herbs, eye drops, creams, and over-the-counter medicines.  Any medical conditions you have.  Whether you are pregnant or may be pregnant. How are the results reported? Your test results will be reported as values. Your health care provider will compare your results to normal ranges that were established after testing a large group of people (reference ranges). Reference ranges may vary among labs and hospitals. For the substances measured in liver function tests, common reference ranges are: ALT  Infant: 10-40 international units/L.  Child or adult: 4-36 international units/L at 37C or 4-36 units/L (SI units).  Reference ranges may be higher for older adults. AST  Newborn 31-49 days old: 35-140 units/L.  Child younger than 95 years old: 15-60 units/L.  57-25 years old: 15-50 units/L.  22-42 years old: 10-50 units/L.  41-67 years old: 10-40 units/L.  Adult: 0-35 units/L or 0-0.58 microkatals/L (SI units).  Reference ranges may be higher for older adults. ALP  Child younger than 6 years old: 85-235 units/L.  75-57 years old: 65-210 units/L.  68-39 years old: 60-300 units/L.  43-47 years old: 30-200 units/L.  Adult: 30-120 units/L or 0.5-2.0 microkatals/L (SI units).  Reference ranges may be higher for older adults. Total bilirubin  Newborn: 1.0-12.0 mg/dL or 17.1-205 micromoles/L (SI units).  Child or adult: 0.3-1.0 mg/dL or 5.1-17 micromoles/L. Albumin  Premature infant: 3.0-4.2 g/dL.  Newborn: 3.5-5.4 g/dL.  Infant: 4.4-5.4 g/dL.  Child: 4.0-5.9 g/dL.  Adult: 3.5-5.0 g/dL or 35-50 g/L (SI units). PT  11.0-12.5 seconds;  85%-100%. INR  0.8-1.1. Total protein  Premature infant: 4.2-7.6 g/dL.  Newborn: 4.6-7.4 g/dL.  Infant: 6.0-6.7 g/dL.  Child: 6.2-8.0 g/dL.  Adult: 6.4-8.3 g/dL or 64-83 g/L (SI units). What do the results mean? Results that are within the reference ranges are considered normal. For each substance measured, results outside the reference range can indicate various health issues. ALT  Levels above the normal range may indicate liver disease. AST  Levels above the normal range may indicate liver disease. Sometimes levels also increase after burns, surgery, heart attack, muscle damage, or seizure. ALP  Levels above the normal range may be seen in biliary obstruction, liver diseases, bone disease, thyroid disease, tumors, fractures, leukemia, lymphoma, or several other conditions. People with blood type O or B may show higher levels after a fatty meal.  Levels below the normal range may indicate bone and teeth conditions, malnutrition, protein deficiency, or Wilson's disease. Total bilirubin  Levels above the normal range may indicate problems with the liver, gallbladder, or bile ducts. Albumin  Levels above the normal range may indicate dehydration. They may also be caused by a diet that is high in protein.  Levels below the normal range may indicate kidney disease, liver disease, or malabsorption of nutrients. PT and INR  Levels above the normal range mean that your blood is clotting slower than normal. This may be due to blood disorders, liver disorders, or low levels of vitamin K. Total protein  Levels above the normal range may be due to infection or other diseases.  Levels below the normal range may be due to an immune system disorder, bleeding, burns, kidney disorder, liver disease, trouble absorbing or getting nutrients, or other conditions that affect the intestines. Talk with your health care provider about what your results mean. Questions to ask your health care  provider Ask your health care provider, or the department that is doing the test:  When will my results be ready?  How will I get my results?  What are my treatment options?  What other tests do I need?  What are my next steps? Summary  Liver function tests are done to see how well your liver is working.  These tests measure various proteins and enzymes in your blood. The results can alert your health care provider to inflammation, damage, or disease in your liver.  Talk with your health care provider about what your results mean. This information is not intended to replace advice given to you by your health care provider. Make sure you discuss any questions you have with your health care provider. Document Revised: 09/06/2017 Document Reviewed: 11/01/2016 Elsevier Patient Education  [place]. Diabetes Mellitus and Exercise Exercising regularly is important for your overall health, especially when you have diabetes (diabetes mellitus). Exercising is not only about losing weight. It has many other  health benefits, such as increasing muscle strength and bone density and reducing body fat and stress. This leads to improved fitness, flexibility, and endurance, all of which result in better overall health. Exercise has additional benefits for people with diabetes, including:  Reducing appetite.  Helping to lower and control blood glucose.  Lowering blood pressure.  Helping to control amounts of fatty substances (lipids) in the blood, such as cholesterol and triglycerides.  Helping the body to respond better to insulin (improving insulin sensitivity).  Reducing how much insulin the body needs.  Decreasing the risk for heart disease by: ? Lowering cholesterol and triglyceride levels. ? Increasing the levels of good cholesterol. ? Lowering blood glucose levels. What is my activity plan? Your health care provider or certified diabetes educator can help you make a plan for  the type and frequency of exercise (activity plan) that works for you. Make sure that you:  Do at least 150 minutes of moderate-intensity or vigorous-intensity exercise each week. This could be brisk walking, biking, or water aerobics. ? Do stretching and strength exercises, such as yoga or weightlifting, at least 2 times a week. ? Spread out your activity over at least 3 days of the week.  Get some form of physical activity every day. ? Do not go more than 2 days in a row without some kind of physical activity. ? Avoid being inactive for more than 30 minutes at a time. Take frequent breaks to walk or stretch.  Choose a type of exercise or activity that you enjoy, and set realistic goals.  Start slowly, and gradually increase the intensity of your exercise over time. What do I need to know about managing my diabetes?   Check your blood glucose before and after exercising. ? If your blood glucose is 240 mg/dL (48.5 mmol/L) or higher before you exercise, check your urine for ketones. If you have ketones in your urine, do not exercise until your blood glucose returns to normal. ? If your blood glucose is 100 mg/dL (5.6 mmol/L) or lower, eat a snack containing 15-20 grams of carbohydrate. Check your blood glucose 15 minutes after the snack to make sure that your level is above 100 mg/dL (5.6 mmol/L) before you start your exercise.  Know the symptoms of low blood glucose (hypoglycemia) and how to treat it. Your risk for hypoglycemia increases during and after exercise. Common symptoms of hypoglycemia can include: ? Hunger. ? Anxiety. ? Sweating and feeling clammy. ? Confusion. ? Dizziness or feeling light-headed. ? Increased heart rate or palpitations. ? Blurry vision. ? Tingling or numbness around the mouth, lips, or tongue. ? Tremors or shakes. ? Irritability.  Keep a rapid-acting carbohydrate snack available before, during, and after exercise to help prevent or treat  hypoglycemia.  Avoid injecting insulin into areas of the body that are going to be exercised. For example, avoid injecting insulin into: ? The arms, when playing tennis. ? The legs, when jogging.  Keep records of your exercise habits. Doing this can help you and your health care provider adjust your diabetes management plan as needed. Write down: ? Food that you eat before and after you exercise. ? Blood glucose levels before and after you exercise. ? The type and amount of exercise you have done. ? When your insulin is expected to peak, if you use insulin. Avoid exercising at times when your insulin is peaking.  When you start a new exercise or activity, work with your health care provider to make sure the  activity is safe for you, and to adjust your insulin, medicines, or food intake as needed.  Drink plenty of water while you exercise to prevent dehydration or heat stroke. Drink enough fluid to keep your urine clear or pale yellow. Summary  Exercising regularly is important for your overall health, especially when you have diabetes (diabetes mellitus).  Exercising has many health benefits, such as increasing muscle strength and bone density and reducing body fat and stress.  Your health care provider or certified diabetes educator can help you make a plan for the type and frequency of exercise (activity plan) that works for you.  When you start a new exercise or activity, work with your health care provider to make sure the activity is safe for you, and to adjust your insulin, medicines, or food intake as needed. This information is not intended to replace advice given to you by your health care provider. Make sure you discuss any questions you have with your health care provider. Document Revised: 08/11/2016 Document Reviewed: 06/29/2015 Elsevier Patient Education  2020 Else[employer] Inc.  Living With Diabetes Diabetes (type 1 diabetes mellitus or type 2 diabetes mellitus) is a condition  in which the body does not have enough of a hormone called insulin, or the body does not respond properly to insulin. Normally, insulin allows sugars (glucose) to enter cells in the body. The cells use glucose for energy. With diabetes, extra glucose builds up in the blood instead of going into cells, which results in high blood glucose (hyperglycemia). How to manage lifestyle changes Managing diabetes includes medical treatments as well as lifestyle changes. If diabetes is not managed well, serious physical and emotional complications can occur. Taking good care of yourself means that you are responsible for:  Monitoring glucose regularly.  Eating a healthy diet.  Exercising regularly.  Meeting with health care providers.  Taking medicines as directed. Some people may feel a lot of stress about managing their diabetes. This is known as emotional distress, and it is very common. Living with diabetes can place you at risk for emotional distress, depression, or anxiety. These disorders can be confusing and can make diabetes management more difficult. How to recognize stress Emotional distress Symptoms of emotional distress include:  Anger about having a diagnosis of diabetes.  Fear or frustration about your diagnosis and the changes you need to make to manage the condition.  Being overly worried about the care that you need or the cost of the care that you need.  Feeling like you caused your condition by doing something wrong.  Fear of unpredictable situations, like low or high blood glucose.  Feeling judged by your health care providers.  Feeling very alone with the disease.  Getting too tired or worn out with the demands of daily care. Depression Having diabetes means that you are at a higher risk for depression. Having depression also means that you are at a higher risk for diabetes. Your health care provider may test (screen) you for symptoms of depression. It is important to  recognize depression symptoms and to start treatment for depression soon after it is diagnosed. The following are some symptoms of depression:  Loss of interest in things that you used to enjoy.  Trouble sleeping, or often waking up early and not being able to get back to sleep.  A change in appetite.  Feeling tired most of the day.  Feeling nervous and anxious.  Feeling guilty and worrying that you are a burden to others.  Feeling depressed more often than you do not feel that way.  Thoughts of hurting yourself or feeling that you want to die. If you have any of these symptoms for 2 weeks or longer, reach out to a health care provider. Follow these instructions at home: Managing emotional distress The following are some ways to manage emotional distress:  Talk with your health care provider or certified diabetes educator. Consider working with a counselor or therapist.  Learn as much as you can about diabetes and its treatment. Meet with a certified diabetes educator or take a class to learn how to manage your condition.  Keep a journal of your thoughts and concerns.  Accept that some things are out of your control.  Talk with other people who have diabetes. It can help to talk with others about the emotional distress that you feel.  Find ways to manage stress that work for you. These may include art or music therapy, exercise, meditation, and hobbies.  Seek support from spiritual leaders, family, and friends. General instructions  Follow your diabetes management plan.  Keep all follow-up visits as told by your health care provider. This is important. Where to find support   Ask your health care provider to recommend a therapist who understands both depression and diabetes.  Search for information and support from the American Diabetes Association: www.diabetes.org  Find a certified diabetes educator and make an appointment through American Association of Diabetes  Educators: www.diabeteseducator.org Get help right away if:  You have thoughts about hurting yourself or others. If you ever feel like you may hurt yourself or others, or have thoughts about taking your own life, get help right away. You can go to your nearest emergency department or call:  Your local emergency services (911 in the [place]  A suicide crisis helpline, such as the National Suicide Prevention Lifeline at 251-670-6607. This is open 24 hours a day. Summary  Diabetes (type 1 diabetes mellitus or type 2 diabetes mellitus) is a condition in which the body does not have enough of a hormone called insulin, or the body does not respond properly to insulin.  Living with diabetes puts you at risk for medical issues, and it also puts you at risk for emotional issues such as emotional distress, depression, and anxiety.  Recognizing the symptoms of emotional distress and depression may help you avoid problems with your diabetes control. It is important to start treatment for emotional distress and depression soon after they are diagnosed.  Having diabetes means that you are at a higher risk for depression. Ask your health care provider to recommend a therapist who understands both depression and diabetes.  If you experience symptoms of emotional distress or depression, it is important to discuss this with your health care provider, certified diabetes educator, or therapist. This information is not intended to replace advice given to you by your health care provider. Make sure you discuss any questions you have with your health care provider. Document Revised: 01/29/2018 Document Reviewed: 06/02/2016 Elsevier Patient Education  2020 [employer] Inc.  Diabetes Mellitus and Foot Care Foot care is an important part of your health, especially when you have diabetes. Diabetes may cause you to have problems because of poor blood flow (circulation) to your feet and legs, which can cause your skin  to:  Become thinner and drier.  Break more easily.  Heal more slowly.  Peel and crack. You may also have nerve damage (neuropathy) in your legs and feet, causing decreased feeling  in them. This means that you may not notice minor injuries to your feet that could lead to more serious problems. Noticing and addressing any potential problems early is the best way to prevent future foot problems. How to care for your feet Foot hygiene  Wash your feet daily with warm water and mild soap. Do not use hot water. Then, pat your feet and the areas between your toes until they are completely dry. Do not soak your feet as this can dry your skin.  Trim your toenails straight across. Do not dig under them or around the cuticle. File the edges of your nails with an emery board or nail file.  Apply a moisturizing lotion or petroleum jelly to the skin on your feet and to dry, brittle toenails. Use lotion that does not contain alcohol and is unscented. Do not apply lotion between your toes. Shoes and socks  Wear clean socks or stockings every day. Make sure they are not too tight. Do not wear knee-high stockings since they may decrease blood flow to your legs.  Wear shoes that fit properly and have enough cushioning. Always look in your shoes before you put them on to be sure there are no objects inside.  To break in new shoes, wear them for just a few hours a day. This prevents injuries on your feet. Wounds, scrapes, corns, and calluses  Check your feet daily for blisters, cuts, bruises, sores, and redness. If you cannot see the bottom of your feet, use a mirror or ask someone for help.  Do not cut corns or calluses or try to remove them with medicine.  If you find a minor scrape, cut, or break in the skin on your feet, keep it and the skin around it clean and dry. You may clean these areas with mild soap and water. Do not clean the area with peroxide, alcohol, or iodine.  If you have a wound,  scrape, corn, or callus on your foot, look at it several times a day to make sure it is healing and not infected. Check for: ? Redness, swelling, or pain. ? Fluid or blood. ? Warmth. ? Pus or a bad smell. General instructions  Do not cross your legs. This may decrease blood flow to your feet.  Do not use heating pads or hot water bottles on your feet. They may burn your skin. If you have lost feeling in your feet or legs, you may not know this is happening until it is too late.  Protect your feet from hot and cold by wearing shoes, such as at the beach or on hot pavement.  Schedule a complete foot exam at least once a year (annually) or more often if you have foot problems. If you have foot problems, report any cuts, sores, or bruises to your health care provider immediately. Contact a health care provider if:  You have a medical condition that increases your risk of infection and you have any cuts, sores, or bruises on your feet.  You have an injury that is not healing.  You have redness on your legs or feet.  You feel burning or tingling in your legs or feet.  You have pain or cramps in your legs and feet.  Your legs or feet are numb.  Your feet always feel cold.  You have pain around a toenail. Get help right away if:  You have a wound, scrape, corn, or callus on your foot and: ? You have pain, swelling,  or redness that gets worse. ? You have fluid or blood coming from the wound, scrape, corn, or callus. ? Your wound, scrape, corn, or callus feels warm to the touch. ? You have pus or a bad smell coming from the wound, scrape, corn, or callus. ? You have a fever. ? You have a red line going up your leg. Summary  Check your feet every day for cuts, sores, red spots, swelling, and blisters.  Moisturize feet and legs daily.  Wear shoes that fit properly and have enough cushioning.  If you have foot problems, report any cuts, sores, or bruises to your health care  provider immediately.  Schedule a complete foot exam at least once a year (annually) or more often if you have foot problems. This information is not intended to replace advice given to you by your health care provider. Make sure you discuss any questions you have with your health care provider. Document Revised: 10/10/2018 Document Reviewed: 02/19/2016 Elsevier Patient Education  2020 [employer].

## 2019-03-29 DIAGNOSIS — E669 Obesity, unspecified: Secondary | ICD-10-CM | POA: Diagnosis not present

## 2019-03-29 DIAGNOSIS — M654 Radial styloid tenosynovitis [de Quervain]: Secondary | ICD-10-CM | POA: Diagnosis not present

## 2019-03-29 DIAGNOSIS — E785 Hyperlipidemia, unspecified: Secondary | ICD-10-CM | POA: Diagnosis not present

## 2019-03-29 DIAGNOSIS — E119 Type 2 diabetes mellitus without complications: Secondary | ICD-10-CM | POA: Diagnosis not present

## 2019-03-29 DIAGNOSIS — Z6839 Body mass index (BMI) 39.0-39.9, adult: Secondary | ICD-10-CM | POA: Diagnosis not present

## 2019-03-29 DIAGNOSIS — M7711 Lateral epicondylitis, right elbow: Secondary | ICD-10-CM | POA: Diagnosis not present

## 2019-03-29 HISTORY — PX: WRIST SURGERY: SHX841

## 2019-04-08 ENCOUNTER — Telehealth: Payer: Self-pay | Admitting: Registered Nurse

## 2019-04-08 DIAGNOSIS — E1065 Type 1 diabetes mellitus with hyperglycemia: Secondary | ICD-10-CM

## 2019-04-08 MED ORDER — ONETOUCH VERIO VI STRP: ORAL_STRIP | 3 refills | Status: DC

## 2019-04-08 NOTE — Telephone Encounter (Signed)
Patient switched glucometer and now using one touch verio needs strips Rx sent to [facility] mail order.  Rx entered for patient please print to send with her paperwork via mail USPS also.

## 2019-04-15 ENCOUNTER — Other Ambulatory Visit: Payer: Self-pay

## 2019-04-15 ENCOUNTER — Ambulatory Visit: Payer: 59 | Admitting: Physician Assistant

## 2019-04-15 ENCOUNTER — Encounter: Payer: Self-pay | Admitting: Physician Assistant

## 2019-04-15 VITALS — BP 120/84 | HR 98 | Temp 99.0°F | Resp 12 | Ht 62.0 in | Wt 218.0 lb

## 2019-04-15 DIAGNOSIS — M659 Synovitis and tenosynovitis, unspecified: Secondary | ICD-10-CM

## 2019-04-15 NOTE — Progress Notes (Signed)
   Subjective: Returned to work evaluation    Patient ID: Lori Carr, female    DOB: [birth year], 54 y.o.   MRN: [id number]  HPI Patient is here for evaluation return to work status post right wrist surgery.  Patient follow-up from Ortho surgery secondary to tenosynovitis of the right wrist.  Patient has been cleared to return back to light duty for 2 weeks.  Patient voices no concerns with restrictions.   Review of Systems Diabetes    Objective:   Physical Exam  No acute distress.  Right-hand-dominant.  Well-healed scar over the distal radius.  Patient is full neck range of motion.      Assessment & Plan: Tenosynovitis right wrist  Patient return back to work status with limitation for 2 weeks.  Follow-up as necessary.

## 2019-04-15 NOTE — Progress Notes (Signed)
  S/P surgery - Rt wrist 03/29/2019 with [facility] - Dr. Stephenie Acres  F/U appt 04/25/19  Has return to work note, but was told by her manager she had to come to [place] for medical clearance.  AMD

## 2019-04-25 DIAGNOSIS — Z7689 Persons encountering health services in other specified circumstances: Secondary | ICD-10-CM | POA: Diagnosis not present

## 2019-04-25 DIAGNOSIS — Z6841 Body Mass Index (BMI) 40.0 and over, adult: Secondary | ICD-10-CM | POA: Diagnosis not present

## 2019-04-25 DIAGNOSIS — E119 Type 2 diabetes mellitus without complications: Secondary | ICD-10-CM | POA: Diagnosis not present

## 2019-04-25 DIAGNOSIS — Z794 Long term (current) use of insulin: Secondary | ICD-10-CM | POA: Diagnosis not present

## 2019-04-26 ENCOUNTER — Ambulatory Visit: Payer: 59

## 2019-04-26 ENCOUNTER — Other Ambulatory Visit: Payer: Self-pay

## 2019-04-26 NOTE — Progress Notes (Signed)
Patient at clinic today for weight check for bariatric surgical process.

## 2019-05-01 DIAGNOSIS — Z20822 Contact with and (suspected) exposure to covid-19: Secondary | ICD-10-CM | POA: Diagnosis not present

## 2019-05-01 DIAGNOSIS — Z01812 Encounter for preprocedural laboratory examination: Secondary | ICD-10-CM | POA: Diagnosis not present

## 2019-05-03 DIAGNOSIS — Z6841 Body Mass Index (BMI) 40.0 and over, adult: Secondary | ICD-10-CM | POA: Diagnosis not present

## 2019-05-03 DIAGNOSIS — Z794 Long term (current) use of insulin: Secondary | ICD-10-CM | POA: Diagnosis not present

## 2019-05-03 DIAGNOSIS — G5602 Carpal tunnel syndrome, left upper limb: Secondary | ICD-10-CM | POA: Diagnosis not present

## 2019-05-03 DIAGNOSIS — E669 Obesity, unspecified: Secondary | ICD-10-CM | POA: Diagnosis not present

## 2019-05-03 DIAGNOSIS — E119 Type 2 diabetes mellitus without complications: Secondary | ICD-10-CM | POA: Diagnosis not present

## 2019-05-12 DIAGNOSIS — Z01818 Encounter for other preprocedural examination: Secondary | ICD-10-CM | POA: Diagnosis not present

## 2019-05-12 DIAGNOSIS — Z20822 Contact with and (suspected) exposure to covid-19: Secondary | ICD-10-CM | POA: Diagnosis not present

## 2019-05-14 DIAGNOSIS — Z6841 Body Mass Index (BMI) 40.0 and over, adult: Secondary | ICD-10-CM | POA: Diagnosis not present

## 2019-05-14 DIAGNOSIS — E109 Type 1 diabetes mellitus without complications: Secondary | ICD-10-CM | POA: Diagnosis not present

## 2019-05-16 DIAGNOSIS — H524 Presbyopia: Secondary | ICD-10-CM | POA: Diagnosis not present

## 2019-05-20 DIAGNOSIS — Z6841 Body Mass Index (BMI) 40.0 and over, adult: Secondary | ICD-10-CM | POA: Diagnosis not present

## 2019-05-20 DIAGNOSIS — Z01818 Encounter for other preprocedural examination: Secondary | ICD-10-CM | POA: Diagnosis not present

## 2019-05-21 DIAGNOSIS — Z713 Dietary counseling and surveillance: Secondary | ICD-10-CM | POA: Diagnosis not present

## 2019-05-23 ENCOUNTER — Other Ambulatory Visit: Payer: Managed Care, Other (non HMO)

## 2019-05-23 DIAGNOSIS — E109 Type 1 diabetes mellitus without complications: Secondary | ICD-10-CM | POA: Diagnosis not present

## 2019-05-23 DIAGNOSIS — Z6841 Body Mass Index (BMI) 40.0 and over, adult: Secondary | ICD-10-CM | POA: Diagnosis not present

## 2019-05-23 DIAGNOSIS — Z01818 Encounter for other preprocedural examination: Secondary | ICD-10-CM | POA: Insufficient documentation

## 2019-05-23 DIAGNOSIS — Z01810 Encounter for preprocedural cardiovascular examination: Secondary | ICD-10-CM | POA: Diagnosis not present

## 2019-05-28 DIAGNOSIS — E109 Type 1 diabetes mellitus without complications: Secondary | ICD-10-CM | POA: Diagnosis not present

## 2019-05-28 DIAGNOSIS — Z6841 Body Mass Index (BMI) 40.0 and over, adult: Secondary | ICD-10-CM | POA: Diagnosis not present

## 2019-06-04 ENCOUNTER — Ambulatory Visit: Payer: Managed Care, Other (non HMO)

## 2019-06-05 DIAGNOSIS — Z6841 Body Mass Index (BMI) 40.0 and over, adult: Secondary | ICD-10-CM | POA: Diagnosis not present

## 2019-06-05 DIAGNOSIS — E109 Type 1 diabetes mellitus without complications: Secondary | ICD-10-CM | POA: Diagnosis not present

## 2019-06-14 DIAGNOSIS — D2271 Melanocytic nevi of right lower limb, including hip: Secondary | ICD-10-CM | POA: Diagnosis not present

## 2019-06-14 DIAGNOSIS — L821 Other seborrheic keratosis: Secondary | ICD-10-CM | POA: Diagnosis not present

## 2019-06-14 DIAGNOSIS — L813 Cafe au lait spots: Secondary | ICD-10-CM | POA: Diagnosis not present

## 2019-06-14 DIAGNOSIS — D2272 Melanocytic nevi of left lower limb, including hip: Secondary | ICD-10-CM | POA: Diagnosis not present

## 2019-06-14 DIAGNOSIS — D1722 Benign lipomatous neoplasm of skin and subcutaneous tissue of left arm: Secondary | ICD-10-CM | POA: Diagnosis not present

## 2019-06-14 DIAGNOSIS — L918 Other hypertrophic disorders of the skin: Secondary | ICD-10-CM | POA: Diagnosis not present

## 2019-06-14 DIAGNOSIS — D225 Melanocytic nevi of trunk: Secondary | ICD-10-CM | POA: Diagnosis not present

## 2019-06-14 DIAGNOSIS — D2262 Melanocytic nevi of left upper limb, including shoulder: Secondary | ICD-10-CM | POA: Diagnosis not present

## 2019-06-14 DIAGNOSIS — D485 Neoplasm of uncertain behavior of skin: Secondary | ICD-10-CM | POA: Diagnosis not present

## 2019-06-14 DIAGNOSIS — D2261 Melanocytic nevi of right upper limb, including shoulder: Secondary | ICD-10-CM | POA: Diagnosis not present

## 2019-07-02 DIAGNOSIS — Z01818 Encounter for other preprocedural examination: Secondary | ICD-10-CM | POA: Diagnosis not present

## 2019-07-08 DIAGNOSIS — Z01818 Encounter for other preprocedural examination: Secondary | ICD-10-CM | POA: Diagnosis not present

## 2019-07-08 DIAGNOSIS — Z20822 Contact with and (suspected) exposure to covid-19: Secondary | ICD-10-CM | POA: Diagnosis not present

## 2019-07-09 DIAGNOSIS — E109 Type 1 diabetes mellitus without complications: Secondary | ICD-10-CM | POA: Diagnosis not present

## 2019-07-09 DIAGNOSIS — Z713 Dietary counseling and surveillance: Secondary | ICD-10-CM | POA: Diagnosis not present

## 2019-07-09 DIAGNOSIS — Z6841 Body Mass Index (BMI) 40.0 and over, adult: Secondary | ICD-10-CM | POA: Diagnosis not present

## 2019-07-11 DIAGNOSIS — Z6841 Body Mass Index (BMI) 40.0 and over, adult: Secondary | ICD-10-CM | POA: Diagnosis not present

## 2019-07-11 DIAGNOSIS — E7800 Pure hypercholesterolemia, unspecified: Secondary | ICD-10-CM | POA: Diagnosis not present

## 2019-07-11 DIAGNOSIS — Z713 Dietary counseling and surveillance: Secondary | ICD-10-CM | POA: Diagnosis not present

## 2019-07-11 DIAGNOSIS — E1040 Type 1 diabetes mellitus with diabetic neuropathy, unspecified: Secondary | ICD-10-CM | POA: Diagnosis not present

## 2019-07-11 DIAGNOSIS — G8918 Other acute postprocedural pain: Secondary | ICD-10-CM | POA: Diagnosis not present

## 2019-07-11 DIAGNOSIS — E109 Type 1 diabetes mellitus without complications: Secondary | ICD-10-CM | POA: Diagnosis not present

## 2019-07-11 DIAGNOSIS — R110 Nausea: Secondary | ICD-10-CM | POA: Diagnosis not present

## 2019-07-11 DIAGNOSIS — Z794 Long term (current) use of insulin: Secondary | ICD-10-CM | POA: Diagnosis not present

## 2019-07-11 DIAGNOSIS — K449 Diaphragmatic hernia without obstruction or gangrene: Secondary | ICD-10-CM | POA: Diagnosis not present

## 2019-07-11 DIAGNOSIS — R1084 Generalized abdominal pain: Secondary | ICD-10-CM | POA: Diagnosis not present

## 2019-07-11 HISTORY — PX: LAPAROSCOPIC GASTRIC RESTRICTIVE DUODENAL PROCEDURE (DUODENAL SWITCH): SHX6667

## 2019-07-14 DIAGNOSIS — Z794 Long term (current) use of insulin: Secondary | ICD-10-CM | POA: Diagnosis not present

## 2019-07-14 DIAGNOSIS — Z7901 Long term (current) use of anticoagulants: Secondary | ICD-10-CM | POA: Diagnosis not present

## 2019-07-14 DIAGNOSIS — Z20822 Contact with and (suspected) exposure to covid-19: Secondary | ICD-10-CM | POA: Diagnosis not present

## 2019-07-14 DIAGNOSIS — I2699 Other pulmonary embolism without acute cor pulmonale: Secondary | ICD-10-CM | POA: Diagnosis not present

## 2019-07-14 DIAGNOSIS — E871 Hypo-osmolality and hyponatremia: Secondary | ICD-10-CM | POA: Diagnosis not present

## 2019-07-14 DIAGNOSIS — R109 Unspecified abdominal pain: Secondary | ICD-10-CM | POA: Diagnosis not present

## 2019-07-14 DIAGNOSIS — E1010 Type 1 diabetes mellitus with ketoacidosis without coma: Secondary | ICD-10-CM | POA: Diagnosis not present

## 2019-07-14 DIAGNOSIS — R0600 Dyspnea, unspecified: Secondary | ICD-10-CM | POA: Diagnosis not present

## 2019-07-14 DIAGNOSIS — R0602 Shortness of breath: Secondary | ICD-10-CM | POA: Diagnosis not present

## 2019-07-14 DIAGNOSIS — Z9884 Bariatric surgery status: Secondary | ICD-10-CM | POA: Diagnosis not present

## 2019-07-14 DIAGNOSIS — D72829 Elevated white blood cell count, unspecified: Secondary | ICD-10-CM | POA: Diagnosis not present

## 2019-07-14 DIAGNOSIS — R112 Nausea with vomiting, unspecified: Secondary | ICD-10-CM | POA: Diagnosis not present

## 2019-07-14 DIAGNOSIS — E1040 Type 1 diabetes mellitus with diabetic neuropathy, unspecified: Secondary | ICD-10-CM | POA: Diagnosis not present

## 2019-07-14 DIAGNOSIS — E0810 Diabetes mellitus due to underlying condition with ketoacidosis without coma: Secondary | ICD-10-CM | POA: Diagnosis not present

## 2019-07-14 DIAGNOSIS — E1310 Other specified diabetes mellitus with ketoacidosis without coma: Secondary | ICD-10-CM | POA: Diagnosis not present

## 2019-07-14 DIAGNOSIS — E7800 Pure hypercholesterolemia, unspecified: Secondary | ICD-10-CM | POA: Diagnosis not present

## 2019-07-16 ENCOUNTER — Other Ambulatory Visit: Payer: Self-pay

## 2019-07-16 DIAGNOSIS — Z1231 Encounter for screening mammogram for malignant neoplasm of breast: Secondary | ICD-10-CM

## 2019-07-23 ENCOUNTER — Ambulatory Visit (INDEPENDENT_AMBULATORY_CARE_PROVIDER_SITE_OTHER): Payer: 59 | Admitting: Internal Medicine

## 2019-07-23 ENCOUNTER — Other Ambulatory Visit: Payer: Self-pay

## 2019-07-23 ENCOUNTER — Encounter: Payer: Self-pay | Admitting: Internal Medicine

## 2019-07-23 VITALS — BP 130/70 | HR 80 | Ht 60.25 in | Wt 211.4 lb

## 2019-07-23 DIAGNOSIS — E1065 Type 1 diabetes mellitus with hyperglycemia: Secondary | ICD-10-CM | POA: Diagnosis not present

## 2019-07-23 LAB — GLUCOSE, POCT (MANUAL RESULT ENTRY): POC Glucose: 246 mg/dl — AB (ref 70–99)

## 2019-07-23 MED ORDER — INSULIN PEN NEEDLE 32G X 4 MM MISC: 1.0000 | Freq: Four times a day (QID) | 6 refills | Status: AC

## 2019-07-23 MED ORDER — LANTUS SOLOSTAR 100 UNIT/ML ~~LOC~~ SOPN: 22.0000 [IU] | PEN_INJECTOR | Freq: Every day | SUBCUTANEOUS | 11 refills | Status: DC

## 2019-07-23 MED ORDER — NOVOLOG FLEXPEN 100 UNIT/ML ~~LOC~~ SOPN: PEN_INJECTOR | SUBCUTANEOUS | 11 refills | Status: DC

## 2019-07-23 NOTE — Progress Notes (Signed)
Name: Lori Carr  Age/ Sex: 54 y.o., female   MRN/ DOB: [id number], July 24, 1965     PCP: No primary care provider on file.   Reason for Endocrinology Evaluation: Type 1 Diabetes Mellitus  Initial Endocrine Consultative Visit: 07/06/2018    PATIENT IDENTIFIER: Lori Carr is a 54 y.o. 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 The patient has followed with Endocrinology clinic since 07/06/2018 for consultative assistance with management of her diabetes.  DIABETIC HISTORY:  [name] was initially diagnosed with T2DM in 2007, she has been on MDI regimen since her diagnosis, she also was on Metformin at somepoint and was subsequently switched to [place] (xigduo) which was stopped after hospitalization in 06/2018 for DKA. GAD-65 was elevated at  137 IU/mL which confirmed a diagnosis of T1DM . Her hemoglobin A1c has ranged from 7.9% in 2015, peaking at 9.2% in 2020.   Brother with T1DM  SUBJECTIVE:   During the last visit (08/2018): A1c 8.7 %. Continued MDI regimen.     Today (07/23/2019): [name] is here for a follow up on diabetes.She has not been to our office in 11 months.   She checks her blood sugars multiple times a day . 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 Diabetes  mellitus    type 2  . Obesity    Past Surgical History:  Past Surgical History:  Procedure Laterality Date  . CESAREAN SECTION     x3  . DILITATION & CURRETTAGE/HYSTROSCOPY WITH NOVASURE ABLATION N/A 07/03/2018   Procedure: DILATATION & CURETTAGE/HYSTEROSCOPY WITH Possible NOVASURE ABLATION;  Surgeon: Brien Few, MD;  Location: [place];  Service: Gynecology;  Laterality: N/A;  . tissue removed from stomach  as child  . TYMPANOSTOMY TUBE PLACEMENT  as child  . WRIST SURGERY Right 03/29/2019    Social History:  reports that she has never smoked. She has never used smokeless tobacco. She reports that she does not drink alcohol and does not use drugs. Family History:  Family History  Problem Relation Age of Onset  . Ovarian cancer Mother 71       deceased age 26 stage IV ovarian cancer  . Breast cancer Maternal Aunt 39  . Breast cancer Paternal Grandmother 57  . Breast cancer Paternal Aunt 66  . Stroke Brother   .  Diabetes Mellitus I Brother      HOME MEDICATIONS: Allergies as of 07/23/2019      Reactions   Amoxicillin-pot Clavulanate Itching   Did it involve swelling of the face/tongue/throat, SOB, or low BP? No Did it involve sudden or severe rash/hives, skin peeling, or any reaction on the inside of your mouth or nose? No Did you need to seek medical attention at a hospital or doctor's office? No When did it last happen?A long time ago If all above answers are "NO", may proceed with cephalosporin use.   Vicodin [hydrocodone-acetaminophen] Nausea Only      Medication List       Accurate as of July 23, 2019 11:51 AM. If you have any questions, ask your nurse or doctor.        STOP taking these medications   atorvastatin 10 MG tablet Commonly known as: LIPITOR Stopped by: Dorita Sciara, MD   Dexcom G6 Receiver Devi Stopped by: Dorita Sciara, MD   Dexcom G6 Sensor Misc Stopped by: Dorita Sciara, MD   Dexcom G6  Transmitter Misc Stopped by: Dorita Sciara, MD   IBU 600 MG tablet Generic drug: ibuprofen Stopped by: Dorita Sciara, MD   oxyCODONE 5 MG immediate release tablet Commonly known as: Oxy IR/ROXICODONE Stopped by: Dorita Sciara, MD     TAKE these medications   diclofenac Sodium 1 % Gel Commonly known as: Voltaren Apply 2 g topically 4 (four) times daily.   insulin aspart 100 UNIT/ML injection Commonly known as: NovoLOG Inject 7 Units into the skin 3 (three) times daily before meals. Max daily 67 units What changed:   how much to take  additional instructions   Lantus SoloStar 100 UNIT/ML Solostar Pen Generic drug: insulin glargine Inject 19 Units into the skin daily. What changed: Another medication with the same name was removed. Continue taking this medication, and follow the directions you see here. Changed by: Dorita Sciara, MD   naproxen 500 MG tablet Commonly known as: Naprosyn Take 1 tablet (500 mg total) by mouth 2 (two) times daily with a meal.   ondansetron 4 MG disintegrating tablet Commonly known as: ZOFRAN-ODT SMARTSIG:1 Tablet(s) By Mouth Every 12 Hours PRN   OneTouch Verio Flex System w/Device Kit See admin instructions.   glucose blood test strip OneTouch Verio test strips   OneTouch Verio test strip Generic drug: glucose blood Use as instructed        OBJECTIVE:   Vital Signs: BP 130/70 (BP Location: Left Arm, Patient Position: Sitting, Cuff Size: Normal)   Pulse 80   Ht 5' 0.25" (1.53 m)   Wt 211 lb 6.4 oz (95.9 kg)   SpO2 97%   BMI 40.94 kg/m   Wt Readings from Last 3 Encounters:  07/23/19 211 lb 6.4 oz (95.9 kg)  04/26/19 220 lb 6.4 oz (100 kg)  04/15/19 218 lb (98.9 kg)     Exam: General: Pt appears well and is in NAD  Lungs: Clear with good BS bilat with no rales, rhonchi, or wheezes  Heart: RRR   Abdomen: Normoactive bowel sounds, soft, nontender, incisions clean  Extremities: Trace pretibial  edema. .  Neuro: MS is good with appropriate affect, pt is alert and Ox3    DM foot exam: 07/23/2019  The skin of the feet is intact without sores or ulcerations. The pedal pulses are 2+ on right and 2+ on left. The sensation is intact to a screening 5.07, 10 gram monofilament  bilaterally   DATA REVIEWED:  Lab Results  Component Value Date   HGBA1C 7.7 (A) 02/11/2019   HGBA1C 8.7 (A) 08/31/2018   HGBA1C 9.2 (H) 06/07/2018   Lab Results  Component Value Date   LDLCALC 126 (H) 03/19/2019   CREATININE 0.76 03/19/2019    Lab Results  Component Value Date   CHOL 209 (H) 03/19/2019   HDL 67 03/19/2019   LDLCALC 126 (H) 03/19/2019   TRIG 89 03/19/2019   CHOLHDL 3.1 03/19/2019       In -Office 246 mg/dl   ASSESSMENT / PLAN / RECOMMENDATIONS:   1) Type 1 Diabetes Mellitus, Sub-optimally controlled, Without complications - Most recent A1c of 7.9 %. Goal A1c < 7.0 %.   - Since her weight loss surgery she has required less insulin overall. I explained to her that she is a moving target at this time, as she will continue to lose weight but also she will eventually increase her carb intake.  - Currently she is consuming ~5 grams of CHO per meal, hence will continue with a correction scale but once she starts puree diet, she will need to contact me with her BG's if elevated, to start her  On a standing dose of Novolog   MEDICATIONS: - Increase Lantus to 22 units  - CF : Novolog (BG- 120/25)   EDUCATION / INSTRUCTIONS:  BG monitoring instructions: Patient is instructed to check her blood sugars 4 times a day, before meals and bedtime.  Call [place] [facility] clinic if: BG persistently < 70 or > 300. . I reviewed the Rule of 15 for the treatment of hypoglycemia in detail with the patient. Literature supplied.     F/U in 3 months    Signed electronically by: Mack Guise, MD  [facility]  [facility] [address] [place], [place] [address] Phone: [phone] FAX: [phone]   CC: No primary care provider on file. No primary provider on file. Phone: None  Fax: None  Return to Endocrinology clinic as below: Future Appointments  Date Time Provider [place]  07/31/2019 10:40 AM [facility]

## 2019-07-23 NOTE — Patient Instructions (Signed)
-   Increase Lantus to 22 units daily  - Novolog correctional insulin: . Use the scale below to help guide you at breakfast, lunch and dinner time   Blood sugar before meal Number of units to inject  Less than 145 0 unit  146 -  170 1 units  171 - 195 2 units  196 -  220 3 units   221 -  245 4 units  246 -  270 5 units  271 -  295 6 units  296 -  320 7 units  321 -  345 8 units  346 - 370 9 units    - Please notify us if you increase your carbohydrate intake      HOW TO TREAT LOW BLOOD SUGARS (Blood sugar LESS THAN 70 MG/DL)  Please follow the RULE OF 15 for the treatment of hypoglycemia treatment (when your (blood sugars are less than 70 mg/dL)    STEP 1: Take 15 grams of carbohydrates when your blood sugar is low, which includes:   3-4 GLUCOSE TABS  OR  3-4 OZ OF JUICE OR REGULAR SODA OR  ONE TUBE OF GLUCOSE GEL     STEP 2: RECHECK blood sugar in 15 MINUTES STEP 3: If your blood sugar is still low at the 15 minute recheck --> then, go back to STEP 1 and treat AGAIN with another 15 grams of carbohydrates.

## 2019-07-24 ENCOUNTER — Telehealth: Payer: Self-pay | Admitting: Internal Medicine

## 2019-07-24 ENCOUNTER — Other Ambulatory Visit: Payer: Self-pay

## 2019-07-24 MED ORDER — LANTUS SOLOSTAR 100 UNIT/ML ~~LOC~~ SOPN: 22.0000 [IU] | PEN_INJECTOR | Freq: Every day | SUBCUTANEOUS | 11 refills | Status: DC

## 2019-07-24 MED ORDER — BASAGLAR KWIKPEN 100 UNIT/ML ~~LOC~~ SOPN: 22.0000 [IU] | PEN_INJECTOR | Freq: Every day | SUBCUTANEOUS | 3 refills | Status: DC

## 2019-07-24 NOTE — Telephone Encounter (Signed)
Patient called to advise that Lantus is not covered by her insurance.  She stated that Levemir and Basaglar are covered, but before she has a change in medication she would like someone to call her about the differences in the 2 other medicines and how they work since she has never taken either.  Call back number is [phone]

## 2019-07-24 NOTE — Telephone Encounter (Signed)
Spoke to pt and she was changed to basaglar

## 2019-07-29 ENCOUNTER — Other Ambulatory Visit: Payer: Self-pay

## 2019-07-29 DIAGNOSIS — E1065 Type 1 diabetes mellitus with hyperglycemia: Secondary | ICD-10-CM

## 2019-07-29 MED ORDER — LANCETS MISC: 3 refills | Status: AC

## 2019-07-30 ENCOUNTER — Telehealth: Payer: Self-pay

## 2019-07-30 ENCOUNTER — Other Ambulatory Visit: Payer: Self-pay

## 2019-07-30 DIAGNOSIS — E1065 Type 1 diabetes mellitus with hyperglycemia: Secondary | ICD-10-CM

## 2019-07-30 MED ORDER — ONETOUCH VERIO VI STRP: ORAL_STRIP | 3 refills | Status: AC

## 2019-07-30 NOTE — Telephone Encounter (Signed)
Lori Carr, pharmacist from [facility] [phone]) contacted [place] to see how many times a day Lori Carr checks her blood sugars.  07/06/2018 notes from  Dr. Rocky Morel [facility] - "Patient instructed to check her blood sugar 4 times a day, before meals & bedtime".  AMD

## 2019-07-31 ENCOUNTER — Ambulatory Visit
Admission: RE | Admit: 2019-07-31 | Discharge: 2019-07-31 | Disposition: A | Discharge status: Home / Self Care | Payer: 59 | Source: Ambulatory Visit | Attending: Emergency Medicine | Admitting: Emergency Medicine

## 2019-07-31 DIAGNOSIS — Z1231 Encounter for screening mammogram for malignant neoplasm of breast: Secondary | ICD-10-CM | POA: Insufficient documentation

## 2019-08-06 DIAGNOSIS — Z9884 Bariatric surgery status: Secondary | ICD-10-CM | POA: Insufficient documentation

## 2019-09-10 ENCOUNTER — Other Ambulatory Visit: Payer: Self-pay

## 2019-09-10 ENCOUNTER — Ambulatory Visit: Payer: 59 | Admitting: Emergency Medicine

## 2019-09-10 ENCOUNTER — Encounter: Payer: Self-pay | Admitting: Emergency Medicine

## 2019-09-10 VITALS — BP 130/78 | HR 73 | Temp 97.6°F | Resp 14 | Ht 61.0 in | Wt 187.0 lb

## 2019-09-10 DIAGNOSIS — B309 Viral conjunctivitis, unspecified: Secondary | ICD-10-CM

## 2019-09-10 MED ORDER — POLYMYXIN B-TRIMETHOPRIM 10000-0.1 UNIT/ML-% OP SOLN
2.0000 [drp] | Freq: Four times a day (QID) | OPHTHALMIC | 0 refills | Status: DC
Start: 2019-09-10 — End: 2019-12-11

## 2019-09-10 NOTE — Progress Notes (Signed)
Pt presents with left eye pain/red. Pt states this about 3 days off irritation but today not as red as it has been. Pt states it was crusted over yesterday and it feels gooey today not as clear as her right eye. CL,RMA

## 2019-09-10 NOTE — Progress Notes (Signed)
  Occupational Health Provider Note       Time seen: 10:35 AM    I have reviewed the vital signs and the nursing notes.  HISTORY   Chief Complaint Eye Pain   HPI Lori Carr is a 54 y.o. female with a history of diabetes, obesity who presents today for left eye irritation.  She has had some pain and redness.  She states this has been about 3 days of irritation today but it is not as red as it has been.  It was crusted over yesterday and feels good week today and is not as clear as the right.  Past Medical History:  Diagnosis Date  . Diabetes mellitus    type 2  . Obesity     Past Surgical History:  Procedure Laterality Date  . CESAREAN SECTION     x3  . DILITATION & CURRETTAGE/HYSTROSCOPY WITH NOVASURE ABLATION N/A 07/03/2018   Procedure: DILATATION & CURETTAGE/HYSTEROSCOPY WITH Possible NOVASURE ABLATION;  Surgeon: Olivia Mackie, MD;  Location: [facility];  Service: Gynecology;  Laterality: N/A;  . tissue removed from stomach  as child  . TYMPANOSTOMY TUBE PLACEMENT  as child  . WRIST SURGERY Right 03/29/2019    Allergies Amoxicillin-pot clavulanate and Vicodin [hydrocodone-acetaminophen]   Review of Systems Constitutional: Negative for fever. Eyes: Positive for left eye redness and drainage Musculoskeletal: Negative for back pain. Skin: Negative for rash. Neurological: Negative for headaches, focal weakness or numbness.  All systems negative/normal/unremarkable except as stated in the HPI  ____________________________________________   PHYSICAL EXAM:  VITAL SIGNS: Vitals:   09/10/19 1019  BP: 130/78  Pulse: 73  Resp: 14  Temp: 97.6 F (36.4 C)  SpO2: 97%    Constitutional: Alert and oriented. Well appearing and in no distress. Eyes: Mild conjunctival injection of the left eye, eye was examined using fluorescein and tetracaine without any dendritic pattern, ulceration or abrasion ENT      Head: Normocephalic and atraumatic.       Nose: No congestion/rhinnorhea.      Mouth/Throat: Mucous membranes are moist.      Neck: No stridor. Neurologic:  Normal speech and language. Skin:  Skin is warm, dry and intact. No rash noted.  ____________________________________________   LABS (pertinent positives/negatives)  Recent Results (from the past 2160 hour(s))  POCT Glucose (CBG)     Status: Abnormal   Collection Time: 07/23/19 12:03 PM  Result Value Ref Range   POC Glucose 246 (A) 70 - 99 mg/dl     ASSESSMENT AND PLAN  Conjunctivitis   Plan: The patient had presented for left eye erythema.  Uncertain conjunctivitis, likely viral.  I will prescribe Polytrim drops and she can follow-up in 2 to 3 days if not improving.  Daryel November MD    Note: This note was generated in part or whole with voice recognition software. Voice recognition is usually quite accurate but there are transcription errors that can and very often do occur. I apologize for any typographical errors that were not detected and corrected.

## 2019-09-26 DIAGNOSIS — F411 Generalized anxiety disorder: Secondary | ICD-10-CM | POA: Insufficient documentation

## 2019-09-26 DIAGNOSIS — F430 Acute stress reaction: Secondary | ICD-10-CM | POA: Insufficient documentation

## 2019-09-26 DIAGNOSIS — R000 Tachycardia, unspecified: Secondary | ICD-10-CM | POA: Insufficient documentation

## 2019-10-04 ENCOUNTER — Other Ambulatory Visit: Payer: Self-pay

## 2019-10-04 MED ORDER — DEXCOM G6 SENSOR MISC: 3 refills | Status: DC

## 2019-10-08 DIAGNOSIS — Z6832 Body mass index (BMI) 32.0-32.9, adult: Secondary | ICD-10-CM | POA: Diagnosis not present

## 2019-10-08 DIAGNOSIS — K9089 Other intestinal malabsorption: Secondary | ICD-10-CM | POA: Diagnosis not present

## 2019-10-08 DIAGNOSIS — Z9884 Bariatric surgery status: Secondary | ICD-10-CM | POA: Diagnosis not present

## 2019-10-08 DIAGNOSIS — E109 Type 1 diabetes mellitus without complications: Secondary | ICD-10-CM | POA: Diagnosis not present

## 2019-10-08 DIAGNOSIS — R1110 Vomiting, unspecified: Secondary | ICD-10-CM | POA: Diagnosis not present

## 2019-10-14 ENCOUNTER — Other Ambulatory Visit: Payer: Self-pay

## 2019-10-14 MED ORDER — DEXCOM G6 TRANSMITTER MISC: 0 refills | Status: DC

## 2019-10-14 MED ORDER — DEXCOM G6 SENSOR MISC: 0 refills | Status: DC

## 2019-11-26 DIAGNOSIS — R1110 Vomiting, unspecified: Secondary | ICD-10-CM | POA: Diagnosis not present

## 2019-11-26 DIAGNOSIS — Z9884 Bariatric surgery status: Secondary | ICD-10-CM | POA: Diagnosis not present

## 2019-11-26 DIAGNOSIS — E109 Type 1 diabetes mellitus without complications: Secondary | ICD-10-CM | POA: Diagnosis not present

## 2019-11-26 DIAGNOSIS — K313 Pylorospasm, not elsewhere classified: Secondary | ICD-10-CM | POA: Diagnosis not present

## 2019-12-09 ENCOUNTER — Telehealth: Payer: Self-pay

## 2019-12-09 NOTE — Telephone Encounter (Signed)
New message    The patient calling regarding a fax on [employer] asking for a call back to discuss.

## 2019-12-10 NOTE — Telephone Encounter (Signed)
Spoken to patient and she stated that she received a call from [facility] company that her supplies was covered. She was told it was waiting for our office. I did not see any thing in patient's name when I check yesterday. However, will check again when in the [facility] office because patient stated that it was weeks ago.

## 2019-12-11 ENCOUNTER — Other Ambulatory Visit: Payer: Self-pay

## 2019-12-11 ENCOUNTER — Encounter: Payer: Self-pay | Admitting: Internal Medicine

## 2019-12-11 ENCOUNTER — Ambulatory Visit: Payer: 59 | Admitting: Internal Medicine

## 2019-12-11 VITALS — BP 126/78 | HR 60 | Ht 61.0 in | Wt 154.4 lb

## 2019-12-11 DIAGNOSIS — E1065 Type 1 diabetes mellitus with hyperglycemia: Secondary | ICD-10-CM

## 2019-12-11 LAB — POCT GLYCOSYLATED HEMOGLOBIN (HGB A1C): Hemoglobin A1C: 8.1 % — AB (ref 4.0–5.6)

## 2019-12-11 MED ORDER — DEXCOM G6 SENSOR MISC
1.0000 | 3 refills | Status: DC
Start: 2019-12-11 — End: 2021-02-18

## 2019-12-11 MED ORDER — DEXCOM G6 TRANSMITTER MISC
3 refills | Status: DC
Start: 2019-12-11 — End: 2020-02-20

## 2019-12-11 MED ORDER — BASAGLAR KWIKPEN 100 UNIT/ML ~~LOC~~ SOPN
17.0000 [IU] | PEN_INJECTOR | Freq: Every day | SUBCUTANEOUS | 11 refills | Status: DC
Start: 2019-12-11 — End: 2020-02-20

## 2019-12-11 MED ORDER — NOVOLOG FLEXPEN 100 UNIT/ML ~~LOC~~ SOPN
PEN_INJECTOR | SUBCUTANEOUS | 11 refills | Status: DC
Start: 2019-12-11 — End: 2020-02-20

## 2019-12-11 NOTE — Progress Notes (Signed)
Name: Lori Carr  Age/ Sex: 54 y.o., female   MRN/ DOB: [id number], [birth year]     PCP: No primary care provider on file.   Reason for Endocrinology Evaluation: Type 1 Diabetes Mellitus  Initial Endocrine Consultative Visit: 07/06/2018    PATIENT IDENTIFIER: Lori Carr is a 54 y.o. 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 The patient has followed with Endocrinology clinic since 07/06/2018 for consultative assistance with management of her diabetes.  DIABETIC HISTORY:  Lori Carr was initially diagnosed with T2DM in 2007, she has been on MDI regimen since her diagnosis, she also was on Metformin at somepoint and was subsequently switched to [place] (xigduo) which was stopped after hospitalization in 06/2018 for DKA. GAD-65 was elevated at  137 IU/mL which confirmed a diagnosis of T1DM . Her hemoglobin A1c has ranged from 7.9% in 2015, peaking at 9.2% in 2020.  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 She just had a weight loss sx with unpredictable oral intake. We stopped prandial dose and provided correction scale and increased basal insulin    Today (12/11/2019): Lori Carr is here for a follow up on diabetes.  She checks her blood sugars multiple times a day . She has not had hypoglycemic episodes since her surgery    She was recently seen by surgeon for vomiting after meals. This has been improving, doing shakes    No change in BM    HOME DIABETES REGIMEN:  Lantus 22 units daily - taking 21 units  CF: Novolog (BG- 130/40)     CGM:  Unable to download      HISTORY:  Past Medical History:  Past Medical History:  Diagnosis Date  . chronic vaginal bleeding   . Diabetes mellitus    type 2  . DKA (diabetic  ketoacidoses)   . History of Elevated LDL cholesterol level   . Obesity    Past Surgical History:  Past Surgical History:  Procedure Laterality Date  . CESAREAN SECTION     x3  . DILITATION & CURRETTAGE/HYSTROSCOPY WITH NOVASURE ABLATION N/A 07/03/2018   Procedure: DILATATION & CURETTAGE/HYSTEROSCOPY WITH Possible NOVASURE ABLATION;  Surgeon: Lori Few, MD;  Location: [place];  Service: Gynecology;  Laterality: N/A;  . tissue removed from stomach  as child  . TYMPANOSTOMY TUBE PLACEMENT  as child  . WRIST SURGERY Right 03/29/2019    Social History:  reports that she has never smoked. She has never used smokeless tobacco. She reports that she does not drink alcohol and does not use drugs. Family History:  Family History  Problem Relation Age of Onset  . Ovarian cancer Mother 44       deceased age 3 stage IV ovarian cancer  . Breast cancer Maternal Aunt 69  . Breast cancer Paternal Grandmother 18  . Breast cancer Paternal Aunt 67  . Stroke Brother   .  Diabetes Mellitus I Brother      HOME MEDICATIONS: Allergies as of 12/11/2019      Reactions   Amoxicillin-pot Clavulanate Itching   Did it involve swelling of the face/tongue/throat, SOB, or low BP? No Did it involve sudden or severe rash/hives, skin peeling, or any reaction on the inside of your mouth or nose? No Did you need to seek medical attention at a hospital or doctor's office? No When did it last happen?A long time ago If all above answers are "NO", may proceed with cephalosporin use.   Vicodin [hydrocodone-acetaminophen] Nausea Only      Medication List       Accurate as of December 11, 2019  3:46 PM. If you have any questions, ask your nurse or doctor.        STOP taking these medications   ondansetron 4 MG disintegrating tablet Commonly known as: ZOFRAN-ODT Stopped by: Dorita Sciara, MD   trimethoprim-polymyxin b ophthalmic solution Commonly known as:  Polytrim Stopped by: Dorita Sciara, MD   VITAMIN B COMPLEX PO Stopped by: Dorita Sciara, MD     TAKE these medications   Basaglar KwikPen 100 UNIT/ML Inject 0.22 mLs (22 Units total) into the skin daily.   Dexcom G6 Sensor Misc Inject 1 Device into the skin as directed. Apply one sensor once every 10 days. What changed:   how much to take  how to take this  when to take this Changed by: Dorita Sciara, MD   Dexcom G6 Transmitter Misc Use one transmitter once every 90 days.   diclofenac Sodium 1 % Gel Commonly known as: Voltaren Apply 2 g topically 4 (four) times daily.   Insulin Pen Needle 32G X 4 MM Misc 1 Device by Does not apply route in the morning, at noon, in the evening, and at bedtime.   Lancets Misc Use as instructed   naproxen 500 MG tablet Commonly known as: Naprosyn Take 1 tablet (500 mg total) by mouth 2 (two) times daily with a meal.   NovoLOG FlexPen 100 UNIT/ML FlexPen Generic drug: insulin aspart Max daily 30 units   OneTouch Verio Flex System w/Device Kit See admin instructions.   OneTouch Verio test strip Generic drug: glucose blood Use as instructed   pantoprazole 40 MG tablet Commonly known as: PROTONIX Take by mouth.        OBJECTIVE:   Vital Signs: BP 126/78   Pulse 60   Ht '5\' 1"'  (1.549 m)   Wt 154 lb 6.4 oz (70 kg)   SpO2 98%   BMI 29.17 kg/m   Wt Readings from Last 3 Encounters:  12/11/19 154 lb 6.4 oz (70 kg)  09/10/19 187 lb (84.8 kg)  07/23/19 211 lb 6.4 oz (95.9 kg)     Exam: General: Pt appears well and is in NAD  Lungs: Clear with good BS bilat with no rales, rhonchi, or wheezes  Heart: RRR   Abdomen: Normoactive bowel sounds, soft, nontender, incisions clean  Extremities: Trace pretibial edema. .  Neuro: MS is good with appropriate affect, pt is alert and Ox3    DM foot exam: 07/23/2019  The skin of the feet is intact without sores or ulcerations. The pedal pulses are 2+ on right  and 2+ on left. The sensation is intact to a screening 5.07, 10 gram monofilament bilaterally   DATA REVIEWED:  Lab Results  Component Value Date   HGBA1C 8.1 (A) 12/11/2019   HGBA1C 7.7 (A) 02/11/2019  HGBA1C 8.7 (A) 08/31/2018   Lab Results  Component Value Date   LDLCALC 126 (H) 03/19/2019   CREATININE 0.76 03/19/2019    Lab Results  Component Value Date   CHOL 209 (H) 03/19/2019   HDL 67 03/19/2019   LDLCALC 126 (H) 03/19/2019   TRIG 89 03/19/2019   CHOLHDL 3.1 03/19/2019         ASSESSMENT / PLAN / RECOMMENDATIONS:   1) Type 1 Diabetes Mellitus, Poorly  controlled, Without complications - Most recent A1c of 8.1 %. Goal A1c < 7.0 %.  - GAD-65 was elevated at  137 IU/mL - Since her bariatric surgery she has become much more sensitive to insulin. She is not using prandial due to risk of hypoglycemia . One night her Bg was 309 and it dropped to 127 mg without prandial insulin per pt. She is currently on too much basal, will reduce as below and adjust prandial dose and correctional scale  - [facility]    MEDICATIONS: - Decrease Lantus to 17 units daily  - Novolog 3 units with each meal  - CF : Novolog (BG- 130/60)   EDUCATION / INSTRUCTIONS:  BG monitoring instructions: Patient is instructed to check her blood sugars 4 times a day, before meals and bedtime.  Call [place] [facility] clinic if: BG persistently < 70  . I reviewed the Rule of 15 for the treatment of hypoglycemia in detail with the patient. Literature supplied.     F/U in 4 months    Signed electronically by: Mack Guise, MD  [facility]in[facility]  [facility] [address] [place], [place] [address] Phone: [phone] FAX: [phone]   CC: No primary care provider on file. No primary provider on file. Phone: None  Fax: None  Return to Endocrinology clinic as below: Future Appointments  Date Time  Provider [place]  04/09/2020 11:10 AM [place] Strum, Melanie Crazier, MD [id number] None

## 2019-12-11 NOTE — Patient Instructions (Addendum)
-   Decrease Lantus to 17 units daily  - Novolog 3 units with each meal  - Novolog correctional insulin: . Use the scale below to help guide you at breakfast, lunch and dinner time   Blood sugar before meal Number of units to inject  Less than 190 0 unit  191 -  250 1 units  251 - 310 2 units  311 -  370 3 units  371 -  430 4 units  431 -  490 5 units  491 -  550 6 units     HOW TO TREAT LOW BLOOD SUGARS (Blood sugar LESS THAN 70 MG/DL)  Please follow the RULE OF 15 for the treatment of hypoglycemia treatment (when your (blood sugars are less than 70 mg/dL)    STEP 1: Take 15 grams of carbohydrates when your blood sugar is low, which includes:   3-4 GLUCOSE TABS  OR  3-4 OZ OF JUICE OR REGULAR SODA OR  ONE TUBE OF GLUCOSE GEL     STEP 2: RECHECK blood sugar in 15 MINUTES STEP 3: If your blood sugar is still low at the 15 minute recheck --> then, go back to STEP 1 and treat AGAIN with another 15 grams of carbohydrates.

## 2019-12-16 NOTE — Telephone Encounter (Signed)
Patient called to speak with Johny Drilling regarding [facility] upload [phone]

## 2019-12-16 NOTE — Telephone Encounter (Signed)
Spoken to patient and when patient was in the office, we tried to connect by sharing on [facility] but was not successful. Patient still cannot connect to the web site to share with our office. Inform patient that I will have to look into it and call her back

## 2019-12-20 ENCOUNTER — Other Ambulatory Visit: Payer: Self-pay

## 2019-12-20 ENCOUNTER — Ambulatory Visit: Payer: Self-pay

## 2019-12-20 DIAGNOSIS — Z9884 Bariatric surgery status: Secondary | ICD-10-CM

## 2019-12-24 ENCOUNTER — Ambulatory Visit: Payer: 59

## 2019-12-31 ENCOUNTER — Ambulatory Visit: Payer: Self-pay

## 2020-01-01 ENCOUNTER — Other Ambulatory Visit: Payer: Self-pay

## 2020-01-01 ENCOUNTER — Ambulatory Visit: Payer: Self-pay

## 2020-01-01 DIAGNOSIS — Z9884 Bariatric surgery status: Secondary | ICD-10-CM

## 2020-01-01 NOTE — Progress Notes (Signed)
Pt needs lab results faxed to requesting provider, Archer Asa, PA-C.  Fax: [phone]

## 2020-01-02 NOTE — Progress Notes (Signed)
Myself and ann dobbins was unable to complete blood draw due to dehydration. Pt rescheduled labs at a later date. CL,RMA

## 2020-01-07 ENCOUNTER — Telehealth: Payer: Self-pay

## 2020-01-07 NOTE — Telephone Encounter (Signed)
Data processing manager at [phone] & CSR reports that Vitamin K level is still in process.  States [facility] behind in that department.  Estimated completion date is Friday (01/10/20).  AMD

## 2020-01-08 DIAGNOSIS — R1013 Epigastric pain: Secondary | ICD-10-CM | POA: Diagnosis not present

## 2020-01-08 DIAGNOSIS — Z9884 Bariatric surgery status: Secondary | ICD-10-CM | POA: Diagnosis not present

## 2020-01-09 DIAGNOSIS — E1065 Type 1 diabetes mellitus with hyperglycemia: Secondary | ICD-10-CM | POA: Diagnosis not present

## 2020-01-14 LAB — CMP12+LP+TP+TSH+6AC+CBC/D/PLT
ALT: 15 IU/L (ref 0–32)
AST: 17 IU/L (ref 0–40)
Albumin/Globulin Ratio: 2 (ref 1.2–2.2)
Albumin: 4.5 g/dL (ref 3.8–4.9)
Alkaline Phosphatase: 151 IU/L — ABNORMAL HIGH (ref 44–121)
BUN/Creatinine Ratio: 16 (ref 9–23)
BUN: 12 mg/dL (ref 6–24)
Basophils Absolute: 0.1 10*3/uL (ref 0.0–0.2)
Basos: 1 %
Bilirubin Total: 0.5 mg/dL (ref 0.0–1.2)
Calcium: 9.7 mg/dL (ref 8.7–10.2)
Chloride: 102 mmol/L (ref 96–106)
Chol/HDL Ratio: 3 ratio (ref 0.0–4.4)
Cholesterol, Total: 141 mg/dL (ref 100–199)
Creatinine, Ser: 0.74 mg/dL (ref 0.57–1.00)
EOS (ABSOLUTE): 0.2 10*3/uL (ref 0.0–0.4)
Eos: 3 %
Estimated CHD Risk: <0.5 times avg. (ref 0.0–1.0)
Free Thyroxine Index: 1.8 (ref 1.2–4.9)
GFR calc Af Amer: 107 mL/min/{1.73_m2} (ref >59)
GFR calc non Af Amer: 93 mL/min/{1.73_m2} (ref >59)
GGT: 4 IU/L (ref 0–60)
Globulin, Total: 2.3 g/dL (ref 1.5–4.5)
Glucose: 102 mg/dL — ABNORMAL HIGH (ref 65–99)
HDL: 47 mg/dL (ref >39)
Hematocrit: 43.8 % (ref 34.0–46.6)
Hemoglobin: 14.9 g/dL (ref 11.1–15.9)
Immature Grans (Abs): 0 10*3/uL (ref 0.0–0.1)
Immature Granulocytes: 0 %
Iron: 81 ug/dL (ref 27–159)
LDH: 205 IU/L (ref 119–226)
LDL Chol Calc (NIH): 79 mg/dL (ref 0–99)
Lymphocytes Absolute: 2.5 10*3/uL (ref 0.7–3.1)
Lymphs: 42 %
MCH: 29.6 pg (ref 26.6–33.0)
MCHC: 34 g/dL (ref 31.5–35.7)
MCV: 87 fL (ref 79–97)
Monocytes Absolute: 0.5 10*3/uL (ref 0.1–0.9)
Monocytes: 8 %
Neutrophils Absolute: 2.8 10*3/uL (ref 1.4–7.0)
Neutrophils: 46 %
Phosphorus: 5.2 mg/dL — ABNORMAL HIGH (ref 3.0–4.3)
Platelets: 257 10*3/uL (ref 150–450)
Potassium: 4.4 mmol/L (ref 3.5–5.2)
RBC: 5.03 x10E6/uL (ref 3.77–5.28)
RDW: 12.4 % (ref 11.7–15.4)
Sodium: 146 mmol/L — ABNORMAL HIGH (ref 134–144)
T3 Uptake Ratio: 24 % (ref 24–39)
T4, Total: 7.6 ug/dL (ref 4.5–12.0)
TSH: 2.04 u[IU]/mL (ref 0.450–4.500)
Total Protein: 6.8 g/dL (ref 6.0–8.5)
Triglycerides: 76 mg/dL (ref 0–149)
Uric Acid: 2.3 mg/dL — ABNORMAL LOW (ref 3.0–7.2)
VLDL Cholesterol Cal: 15 mg/dL (ref 5–40)
WBC: 6 10*3/uL (ref 3.4–10.8)

## 2020-01-14 LAB — VITAMIN A: Vitamin A: 23.4 ug/dL (ref 20.1–62.0)

## 2020-01-14 LAB — VITAMIN K1, SERUM: VITAMIN K1: 0.14 ng/mL (ref 0.10–2.20)

## 2020-01-14 LAB — HGB A1C W/O EAG: Hgb A1c MFr Bld: 8.3 % — ABNORMAL HIGH (ref 4.8–5.6)

## 2020-01-14 LAB — B12 AND FOLATE PANEL
Folate: 6.1 ng/mL (ref >3.0)
Vitamin B-12: 625 pg/mL (ref 232–1245)

## 2020-01-14 LAB — FERRITIN: Ferritin: 90 ng/mL (ref 15–150)

## 2020-01-14 LAB — VITAMIN B1: Thiamine: 123.6 nmol/L (ref 66.5–200.0)

## 2020-01-14 LAB — PARATHYROID HORMONE, INTACT (NO CA): PTH: 45 pg/mL (ref 15–65)

## 2020-01-14 LAB — VITAMIN E
Vitamin E (Alpha Tocopherol): 8 mg/L (ref 7.0–25.1)
Vitamin E(Gamma Tocopherol): 1 mg/L (ref 0.5–5.5)

## 2020-01-14 LAB — ZINC: Zinc: 72 ug/dL (ref 44–115)

## 2020-02-11 DIAGNOSIS — Z9884 Bariatric surgery status: Secondary | ICD-10-CM | POA: Diagnosis not present

## 2020-02-11 DIAGNOSIS — R1030 Lower abdominal pain, unspecified: Secondary | ICD-10-CM | POA: Diagnosis not present

## 2020-02-11 DIAGNOSIS — M79602 Pain in left arm: Secondary | ICD-10-CM | POA: Diagnosis not present

## 2020-02-11 DIAGNOSIS — M79632 Pain in left forearm: Secondary | ICD-10-CM | POA: Diagnosis not present

## 2020-02-11 DIAGNOSIS — R10819 Abdominal tenderness, unspecified site: Secondary | ICD-10-CM | POA: Diagnosis not present

## 2020-02-11 DIAGNOSIS — R918 Other nonspecific abnormal finding of lung field: Secondary | ICD-10-CM | POA: Diagnosis not present

## 2020-02-11 DIAGNOSIS — M25532 Pain in left wrist: Secondary | ICD-10-CM | POA: Diagnosis not present

## 2020-02-11 DIAGNOSIS — M25522 Pain in left elbow: Secondary | ICD-10-CM | POA: Diagnosis not present

## 2020-02-11 DIAGNOSIS — M79642 Pain in left hand: Secondary | ICD-10-CM | POA: Diagnosis not present

## 2020-02-11 DIAGNOSIS — E119 Type 2 diabetes mellitus without complications: Secondary | ICD-10-CM | POA: Diagnosis not present

## 2020-02-11 DIAGNOSIS — R519 Headache, unspecified: Secondary | ICD-10-CM | POA: Diagnosis not present

## 2020-02-20 ENCOUNTER — Ambulatory Visit: Payer: Self-pay | Admitting: Adult Medicine

## 2020-02-20 ENCOUNTER — Encounter: Payer: Self-pay | Admitting: Adult Medicine

## 2020-02-20 ENCOUNTER — Other Ambulatory Visit: Payer: Self-pay

## 2020-02-20 VITALS — HR 67 | Temp 98.3°F | Resp 14 | Ht 61.0 in | Wt 136.0 lb

## 2020-02-20 DIAGNOSIS — R911 Solitary pulmonary nodule: Secondary | ICD-10-CM
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
## 2020-03-10 ENCOUNTER — Other Ambulatory Visit: Payer: Self-pay

## 2020-03-10 ENCOUNTER — Ambulatory Visit
Admission: RE | Admit: 2020-03-10 | Discharge: 2020-03-10 | Disposition: A | Discharge status: Home / Self Care | Payer: 59 | Source: Ambulatory Visit | Attending: Adult Medicine | Admitting: Adult Medicine

## 2020-03-10 DIAGNOSIS — I700 Atherosclerosis of aorta: Secondary | ICD-10-CM | POA: Diagnosis not present

## 2020-03-10 DIAGNOSIS — R911 Solitary pulmonary nodule: Secondary | ICD-10-CM | POA: Diagnosis not present

## 2020-03-10 DIAGNOSIS — J984 Other disorders of lung: Secondary | ICD-10-CM | POA: Diagnosis not present

## 2020-03-10 DIAGNOSIS — J8410 Pulmonary fibrosis, unspecified: Secondary | ICD-10-CM | POA: Diagnosis not present

## 2020-03-17 ENCOUNTER — Encounter: Payer: Self-pay | Admitting: Internal Medicine

## 2020-03-19 ENCOUNTER — Encounter: Payer: Self-pay | Admitting: Adult Medicine

## 2020-03-19 ENCOUNTER — Ambulatory Visit: Payer: Self-pay | Admitting: Adult Medicine

## 2020-03-19 ENCOUNTER — Other Ambulatory Visit: Payer: Self-pay

## 2020-03-19 VITALS — BP 117/75 | HR 56 | Temp 97.8°F | Resp 14 | Ht 61.0 in | Wt 125.0 lb

## 2020-03-19 DIAGNOSIS — R911 Solitary pulmonary nodule: Secondary | ICD-10-CM

## 2020-03-19 NOTE — Progress Notes (Signed)
  Soap1 Review of CTS  medial rt lower lung Mucoid impaction compared to 06/2018 tissue lucency unchanged. calcified granuloma Lt upper lobe. Denies cough, pleuritic pain, fever, fatigue night sweats Plan to follow if sxs changes will need for referral   Soap2 June 2021 bariactric surgery states has difficulty with keeping up with frequency of meals and hold food down. Just last evening regurgitated milk she had taken before bedtime. Concerned she is not keeping up with post surgery dietary recommendation Recent CTS showed aortic atherosclerosis Will add loading dose vitd with maintenance 2-3000iu depending on patient tolerance

## 2020-03-21 ENCOUNTER — Other Ambulatory Visit: Payer: Self-pay | Admitting: Physician Assistant

## 2020-04-09 ENCOUNTER — Encounter: Payer: Self-pay | Admitting: Internal Medicine

## 2020-04-09 ENCOUNTER — Other Ambulatory Visit: Payer: Self-pay

## 2020-04-09 ENCOUNTER — Ambulatory Visit (INDEPENDENT_AMBULATORY_CARE_PROVIDER_SITE_OTHER): Payer: 59 | Admitting: Internal Medicine

## 2020-04-09 VITALS — BP 122/76 | HR 87 | Ht 61.0 in | Wt 128.0 lb

## 2020-04-09 DIAGNOSIS — E1065 Type 1 diabetes mellitus with hyperglycemia: Secondary | ICD-10-CM

## 2020-04-09 LAB — POCT GLYCOSYLATED HEMOGLOBIN (HGB A1C): Hemoglobin A1C: 8 % — AB (ref 4.0–5.6)

## 2020-04-09 NOTE — Patient Instructions (Signed)
-   Decrease Lantus to 11  units daily  - Novolog 1-3  units with each meal  - Novolog correctional insulin: . Use the scale below to help guide you at breakfast, lunch and dinner time   Blood sugar before meal Number of units to inject  Less than 190 0 unit  191 -  250 1 units  251 - 310 2 units  311 -  370 3 units  371 -  430 4 units  431 -  490 5 units  491 -  550 6 units     HOW TO TREAT LOW BLOOD SUGARS (Blood sugar LESS THAN 70 MG/DL)  Please follow the RULE OF 15 for the treatment of hypoglycemia treatment (when your (blood sugars are less than 70 mg/dL)    STEP 1: Take 15 grams of carbohydrates when your blood sugar is low, which includes:   3-4 GLUCOSE TABS  OR  3-4 OZ OF JUICE OR REGULAR SODA OR  ONE TUBE OF GLUCOSE GEL     STEP 2: RECHECK blood sugar in 15 MINUTES STEP 3: If your blood sugar is still low at the 15 minute recheck --> then, go back to STEP 1 and treat AGAIN with another 15 grams of carbohydrates.

## 2020-04-09 NOTE — Progress Notes (Unsigned)
Name: Lori Carr  Age/ Sex: 55 y.o., female   MRN/ DOB: [id number], 1965-05-22     PCP: Patient, No Pcp Per   Reason for Endocrinology Evaluation: Type 1 Diabetes Mellitus  Initial Endocrine Consultative Visit: 07/06/2018    PATIENT IDENTIFIER: [name] is a 55 y.o. 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 The patient has followed with Endocrinology clinic since 07/06/2018 for consultative assistance with management of her diabetes.  DIABETIC HISTORY:  Lori Carr was initially diagnosed with T2DM in 2007, she has been on MDI regimen since her diagnosis, she also was on Metformin at somepoint and was subsequently switched to [place] (xigduo) which was stopped after hospitalization in 06/2018 for DKA. GAD-65 was elevated at  137 IU/mL which confirmed a diagnosis of T1DM . Her hemoglobin A1c has ranged from 7.9% in 2015, peaking at 9.2% in 2020.  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 Adjusted MDI regimen    Today (04/09/2020): Lori Carr is here for a follow up on diabetes.  She checks her blood sugars multiple times a day through the CGM  . She has had hypoglycemic episodes since her surgery    So far has lost 92 lbs since weight loss sx  Has rare vomiting  Denies constipation or diarrhea   HOME DIABETES REGIMEN:  Lantus 14units daily  Novolog 3 units with each meal - 1-2 units  CF: Novolog (BG- 130/60) - has not been taking      CONTINUOUS GLUCOSE MONITORING RECORD INTERPRETATION    Dates of Recording: 2/25-3/10/2020  Sensor description:dexcom   Results statistics:   CGM use % of time 64  Average and SD 227/105  Time in range     31   %  % Time Above 180 17  % Time above 250 46  % Time Below target 3      Glycemic patterns summary: Hyperglycemia during the day and hypoglycemia at night   Hyperglycemic episodes  During the day   Hypoglycemic episodes occurred overnight   Overnight periods: Trends down          HISTORY:  Past Medical History:  Past Medical History:  Diagnosis Date  . chronic vaginal bleeding   . Diabetes mellitus    type 2  . DKA (diabetic ketoacidoses)   . History of Elevated LDL cholesterol level   . Obesity    Past Surgical History:  Past Surgical History:  Procedure Laterality Date  . CESAREAN SECTION     x3  . DILITATION & CURRETTAGE/HYSTROSCOPY WITH NOVASURE ABLATION N/A 07/03/2018   Procedure: DILATATION & CURETTAGE/HYSTEROSCOPY WITH Possible NOVASURE ABLATION;  Surgeon: Brien Few, MD;  Location: [place];  Service: Gynecology;  Laterality: N/A;  . tissue removed from stomach  as child  . TYMPANOSTOMY TUBE PLACEMENT  as child  . WRIST SURGERY Right 03/29/2019    Social History:  reports that she has never smoked. She has never used smokeless tobacco. She reports that she does not drink alcohol and does not use drugs. Family History:  Family History  Problem Relation Age of Onset  . Ovarian cancer Mother 77       deceased age 58 stage IV ovarian cancer  . Breast cancer Maternal Aunt 64  . Breast cancer Paternal Grandmother 29  . Breast cancer Paternal Aunt 22  . Stroke Brother   . Diabetes Mellitus I Brother      HOME MEDICATIONS: Allergies as of 04/09/2020      Reactions   Amoxicillin-pot Clavulanate Itching   Did it involve swelling of the face/tongue/throat, SOB, or low BP? No Did it involve sudden or severe rash/hives, skin peeling, or any reaction on the inside of your mouth or nose? No Did you need to seek medical attention at a hospital or doctor's office? No When did it last happen?A long time ago If all above answers are "NO", may proceed with cephalosporin use.   Vicodin  [hydrocodone-acetaminophen] Nausea Only      Medication List       Accurate as of April 09, 2020 11:33 AM. If you have any questions, ask your nurse or doctor.        Dexcom G6 Sensor Misc Inject 1 Device into the skin as directed. Apply one sensor once every 10 days.   IBU 600 MG tablet Generic drug: ibuprofen Take by mouth.   insulin aspart 100 UNIT/ML injection Commonly known as: novoLOG Inject into the skin.   insulin glargine 100 UNIT/ML injection Commonly known as: LANTUS Inject into the skin.   Insulin Pen Needle 32G X 4 MM Misc 1 Device by Does not apply route in the morning, at noon, in the evening, and at bedtime.   Lancets Misc Use as instructed   OneTouch Verio Flex System w/Device Kit See admin instructions.   OneTouch Verio test strip Generic drug: glucose blood Use as instructed        OBJECTIVE:   Vital Signs: BP 122/76   Pulse 87   Ht '5\' 1"'  (1.549 m)   Wt 128 lb (58.1 kg)   SpO2 97%   BMI 24.19 kg/m   Wt Readings from Last 3 Encounters:  04/09/20 128 lb (58.1 kg)  03/19/20 125 lb (56.7 kg)  02/20/20 136 lb (61.7 kg)     Exam: General: Pt appears well and is in NAD  Lungs: Clear with good BS bilat with no rales, rhonchi, or wheezes  Heart: RRR   Abdomen: Normoactive bowel sounds, soft, nontender, incisions clean  Extremities: Trace pretibial edema. .  Neuro: MS is good with appropriate affect, pt is alert and Ox3    DM foot exam: 07/23/2019  The skin of the feet is intact without sores or ulcerations. The pedal pulses are 2+ on right and 2+ on left. The sensation is intact to a screening 5.07, 10 gram monofilament bilaterally   DATA REVIEWED:  Lab Results  Component Value Date   HGBA1C 8.0 (A) 04/09/2020   HGBA1C 8.3 (H) 01/01/2020   HGBA1C 8.1 (A) 12/11/2019   Lab Results  Component Value Date   LDLCALC 79 01/01/2020   CREATININE 0.74 01/01/2020    Lab Results  Component Value Date   CHOL 141 01/01/2020   HDL  47 01/01/2020   LDLCALC 79 01/01/2020   TRIG 76 01/01/2020   CHOLHDL 3.0 01/01/2020         ASSESSMENT /  PLAN / RECOMMENDATIONS:   1) Type 1 Diabetes Mellitus, Suboptimally  controlled, Without complications - Most recent A1c of 8.0 %. Goal A1c < 7.0 %.  - GAD-65 was elevated at  137 IU/mL - Since her bariatric surgery she has become much more sensitive to insulin.  - Pt has been having hypoglycemia despite reducing her basal insulin, but has severe hyperglycemia postprandial ,. This is partly due to gastric bypass sx and change in absorption etc  - I am going to reduce her basal insulin, she was encouraged to use correctional scale before each meal - Discussed pump options, she will look in to the tandem and Omnipod  MEDICATIONS: - Decrease Lantus to 11 units daily  - Novolog 1-3 units with each meal  - CF : Novolog (BG- 130/60)   EDUCATION / INSTRUCTIONS:  BG monitoring instructions: Patient is instructed to check her blood sugars 4 times a day, before meals and bedtime.  Call [place] [facility] clinic if: BG persistently < 70  . I reviewed the Rule of 15 for the treatment of hypoglycemia in detail with the patient. Literature supplied.     F/U in 4 months    Signed electronically by: Mack Guise, MD  [facility] Endocrinology  [facility] [address] [place], [place] [address] Phone: [phone] FAX: [phone]   CC: Patient, No Pcp Per No address on file Phone: None  Fax: None  Return to Endocrinology clinic as below: Future Appointments  Date Time Provider [place]  05/11/2020  8:30 AM CBP NURSE CBP-CBP None  05/19/2020  8:15 AM CBP-PROVIDER [facility]BP[facility] None

## 2020-04-10 MED ORDER — LANTUS SOLOSTAR 100 UNIT/ML ~~LOC~~ SOPN: 11.0000 [IU] | PEN_INJECTOR | Freq: Every day | SUBCUTANEOUS | 11 refills | Status: DC

## 2020-05-11 ENCOUNTER — Other Ambulatory Visit: Payer: Self-pay

## 2020-05-11 ENCOUNTER — Ambulatory Visit: Payer: Self-pay

## 2020-05-11 DIAGNOSIS — Z01818 Encounter for other preprocedural examination: Secondary | ICD-10-CM

## 2020-05-11 LAB — POCT URINALYSIS DIPSTICK
Bilirubin, UA: NEGATIVE
Blood, UA: NEGATIVE
Glucose, UA: POSITIVE — AB
Ketones, UA: NEGATIVE
Leukocytes, UA: NEGATIVE
Nitrite, UA: NEGATIVE
Protein, UA: NEGATIVE
Spec Grav, UA: 1.025 (ref 1.010–1.025)
Urobilinogen, UA: 0.2 E.U./dL
pH, UA: 6 (ref 5.0–8.0)

## 2020-05-11 NOTE — Progress Notes (Signed)
Pt scheduled to complete physical 05/19/20. CL,RMA

## 2020-05-12 DIAGNOSIS — Z01818 Encounter for other preprocedural examination: Secondary | ICD-10-CM | POA: Diagnosis not present

## 2020-05-12 LAB — CMP12+LP+TP+TSH+6AC+CBC/D/PLT
ALT: 12 IU/L (ref 0–32)
AST: 16 IU/L (ref 0–40)
Albumin/Globulin Ratio: 1.5 (ref 1.2–2.2)
Albumin: 3.7 g/dL — ABNORMAL LOW (ref 3.8–4.9)
Alkaline Phosphatase: 144 IU/L — ABNORMAL HIGH (ref 44–121)
BUN/Creatinine Ratio: 16 (ref 9–23)
BUN: 13 mg/dL (ref 6–24)
Basophils Absolute: 0.1 10*3/uL (ref 0.0–0.2)
Basos: 1 %
Bilirubin Total: 0.6 mg/dL (ref 0.0–1.2)
Calcium: 8.9 mg/dL (ref 8.7–10.2)
Chloride: 103 mmol/L (ref 96–106)
Chol/HDL Ratio: 2.6 ratio (ref 0.0–4.4)
Cholesterol, Total: 137 mg/dL (ref 100–199)
Creatinine, Ser: 0.81 mg/dL (ref 0.57–1.00)
EOS (ABSOLUTE): 0.2 10*3/uL (ref 0.0–0.4)
Eos: 3 %
Estimated CHD Risk: <0.5 times avg. (ref 0.0–1.0)
Free Thyroxine Index: 1.7 (ref 1.2–4.9)
GGT: 3 IU/L (ref 0–60)
Globulin, Total: 2.4 g/dL (ref 1.5–4.5)
Glucose: 131 mg/dL — ABNORMAL HIGH (ref 65–99)
HDL: 52 mg/dL (ref >39)
Hematocrit: 42.2 % (ref 34.0–46.6)
Hemoglobin: 14.5 g/dL (ref 11.1–15.9)
Immature Grans (Abs): 0 10*3/uL (ref 0.0–0.1)
Immature Granulocytes: 0 %
Iron: 66 ug/dL (ref 27–159)
LDH: 184 IU/L (ref 119–226)
LDL Chol Calc (NIH): 73 mg/dL (ref 0–99)
Lymphocytes Absolute: 2.7 10*3/uL (ref 0.7–3.1)
Lymphs: 45 %
MCH: 29.5 pg (ref 26.6–33.0)
MCHC: 34.4 g/dL (ref 31.5–35.7)
MCV: 86 fL (ref 79–97)
Monocytes Absolute: 0.5 10*3/uL (ref 0.1–0.9)
Monocytes: 8 %
Neutrophils Absolute: 2.6 10*3/uL (ref 1.4–7.0)
Neutrophils: 43 %
Phosphorus: 5.4 mg/dL — ABNORMAL HIGH (ref 3.0–4.3)
Platelets: 246 10*3/uL (ref 150–450)
Potassium: 3.9 mmol/L (ref 3.5–5.2)
RBC: 4.92 x10E6/uL (ref 3.77–5.28)
RDW: 12.7 % (ref 11.7–15.4)
Sodium: 143 mmol/L (ref 134–144)
T3 Uptake Ratio: 24 % (ref 24–39)
T4, Total: 7.1 ug/dL (ref 4.5–12.0)
TSH: 1.89 u[IU]/mL (ref 0.450–4.500)
Total Protein: 6.1 g/dL (ref 6.0–8.5)
Triglycerides: 55 mg/dL (ref 0–149)
Uric Acid: 2.6 mg/dL — ABNORMAL LOW (ref 3.0–7.2)
VLDL Cholesterol Cal: 12 mg/dL (ref 5–40)
WBC: 5.9 10*3/uL (ref 3.4–10.8)
eGFR: 87 mL/min/{1.73_m2} (ref >59)

## 2020-05-12 LAB — HGB A1C W/O EAG: Hgb A1c MFr Bld: 8.8 % — ABNORMAL HIGH (ref 4.8–5.6)

## 2020-05-15 ENCOUNTER — Other Ambulatory Visit: Payer: Self-pay | Admitting: Internal Medicine

## 2020-05-19 ENCOUNTER — Other Ambulatory Visit: Payer: Self-pay

## 2020-05-19 ENCOUNTER — Ambulatory Visit: Payer: Self-pay | Admitting: Physician Assistant

## 2020-05-19 ENCOUNTER — Encounter: Payer: Self-pay | Admitting: Physician Assistant

## 2020-05-19 VITALS — BP 128/75 | HR 60 | Temp 97.7°F | Resp 14 | Ht 61.0 in | Wt 121.0 lb

## 2020-05-19 DIAGNOSIS — Z0000 Encounter for general adult medical examination without abnormal findings: Secondary | ICD-10-CM

## 2020-05-19 NOTE — Progress Notes (Signed)
   Subjective: Annual physical exam    Patient ID: Lori Carr, female    DOB: Apr 11, 1965, 55 y.o.   MRN: [id number]  HPI Patient presents for annual physical exam.  Patient reports Concerns or complaints at this time.  Patient recently saw her endocrinologist and had insulin adjustments done for her diabetic condition.   Review of Systems    Diabetes and also arthritis Objective:   Physical Exam No acute distress.  Temperature is 97.7, pulse 60, respiration 14, BP is 128/75, patient is 98% O2 sat on room air. HEENT is unremarkable.  Neck is supple for adenopathy or bruits.  Lungs are clear to auscultation.  Heart regular rate and rhythm.  No acute findings on EKG. Negative HSM, normoactive bowel sounds, soft, and nontender to palpation. No obvious deformity to the upper or lower extremities.  Patient has full and equal range of motion in upper and lower extremities. No obvious cervical or lumbar spine deformity.  Patient had full and equal range of motion cervical lumbar spine. Cranial nerves II through XII grossly intact.       Assessment & Plan: Well exam.  Discussed lab results with patient.  Discussed having repeat labs in 3 months.  Patient states he is pending labs in 2 months from her endocrinologist.  We will review those lab results as warranted.

## 2020-06-03 ENCOUNTER — Other Ambulatory Visit: Payer: Self-pay

## 2020-06-08 DIAGNOSIS — H5213 Myopia, bilateral: Secondary | ICD-10-CM | POA: Diagnosis not present

## 2020-06-11 ENCOUNTER — Ambulatory Visit: Payer: Self-pay

## 2020-06-11 DIAGNOSIS — R197 Diarrhea, unspecified: Secondary | ICD-10-CM | POA: Diagnosis not present

## 2020-06-11 DIAGNOSIS — Z9884 Bariatric surgery status: Secondary | ICD-10-CM | POA: Diagnosis not present

## 2020-06-11 DIAGNOSIS — N390 Urinary tract infection, site not specified: Secondary | ICD-10-CM | POA: Diagnosis not present

## 2020-06-11 DIAGNOSIS — Z794 Long term (current) use of insulin: Secondary | ICD-10-CM | POA: Diagnosis not present

## 2020-06-11 DIAGNOSIS — R001 Bradycardia, unspecified: Secondary | ICD-10-CM | POA: Diagnosis not present

## 2020-06-11 DIAGNOSIS — R1110 Vomiting, unspecified: Secondary | ICD-10-CM | POA: Diagnosis not present

## 2020-06-11 DIAGNOSIS — R112 Nausea with vomiting, unspecified: Secondary | ICD-10-CM | POA: Diagnosis not present

## 2020-06-11 DIAGNOSIS — E119 Type 2 diabetes mellitus without complications: Secondary | ICD-10-CM | POA: Diagnosis not present

## 2020-06-11 DIAGNOSIS — R1031 Right lower quadrant pain: Secondary | ICD-10-CM | POA: Diagnosis not present

## 2020-06-18 ENCOUNTER — Ambulatory Visit (INDEPENDENT_AMBULATORY_CARE_PROVIDER_SITE_OTHER): Payer: 59 | Admitting: Internal Medicine

## 2020-06-18 ENCOUNTER — Other Ambulatory Visit: Payer: Self-pay

## 2020-06-18 ENCOUNTER — Encounter: Payer: Self-pay | Admitting: Internal Medicine

## 2020-06-18 VITALS — BP 124/80 | HR 56 | Ht 61.0 in | Wt 121.0 lb

## 2020-06-18 DIAGNOSIS — E1065 Type 1 diabetes mellitus with hyperglycemia: Secondary | ICD-10-CM

## 2020-06-18 NOTE — Progress Notes (Signed)
Name: Lori Carr  Age/ Sex: 55 y.o., female   MRN/ DOB: [id number], 05-28-1965     PCP: Patient, No Pcp Per (Inactive)   Reason for Endocrinology Evaluation: Type 1 Diabetes Mellitus  Initial Endocrine Consultative Visit: 07/06/2018    PATIENT IDENTIFIER: Lori Carr is a 55 y.o. 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 The patient has followed with Endocrinology clinic since 07/06/2018 for consultative assistance with management of her diabetes.  DIABETIC HISTORY:  Lori Carr was initially diagnosed with T2DM in 2007, she has been on MDI regimen since her diagnosis, she also was on Metformin at somepoint and was subsequently switched to [place] (xigduo) which was stopped after hospitalization in 06/2018 for DKA. GAD-65 was elevated at  137 IU/mL which confirmed a diagnosis of T1DM . Her hemoglobin A1c has ranged from 7.9% in 2015, peaking at 9.2% in 2020.  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 Adjusted MDI regimen    Today (06/18/2020): Lori Carr is here for a follow up on diabetes.  She checks her blood sugars multiple times a day through the CGM  . She has had hypoglycemic episodes since her surgery      Improved vomiting  Denies constipation or diarrhea  Denies abdominal pain   HOME DIABETES REGIMEN:  Lantus 11 units daily  Novolog 3 units with each meal  CF: Novolog (BG- 130/60) - has not been taking      CONTINUOUS GLUCOSE MONITORING RECORD INTERPRETATION    Dates of Recording: 5/6-5/19/2022  Sensor description:dexcom   Results statistics:   CGM use % of time 93  Average and SD 223  Time in range   35 %  % Time Above 180 23  % Time above 250 38  % Time Below target 3     Glycemic patterns  summary: Optimal Bg's between 7 am and 2 pm but hyperglycemia noted the rest of the time   Hyperglycemic episodes  Between 2 pm until 6 AM   Hypoglycemic episodes occurred variable   Overnight periods: Trends down       HISTORY:  Past Medical History:  Past Medical History:  Diagnosis Date  . chronic vaginal bleeding   . Diabetes mellitus    type 2  . DKA (diabetic ketoacidoses)   . History of Elevated LDL cholesterol level   . Obesity    Past Surgical History:  Past Surgical History:  Procedure Laterality Date  . CESAREAN SECTION     x3  . DILITATION & CURRETTAGE/HYSTROSCOPY WITH NOVASURE ABLATION N/A 07/03/2018   Procedure: DILATATION & CURETTAGE/HYSTEROSCOPY WITH Possible NOVASURE ABLATION;  Surgeon: Brien Few, MD;  Location: [place];  Service: Gynecology;  Laterality: N/A;  . tissue removed from stomach  as child  . TYMPANOSTOMY TUBE PLACEMENT  as child  . WRIST SURGERY Right 03/29/2019    Social History:  reports that she  has never smoked. She has never used smokeless tobacco. She reports that she does not drink alcohol and does not use drugs. Family History:  Family History  Problem Relation Age of Onset  . Ovarian cancer Mother 22       deceased age 79 stage IV ovarian cancer  . Breast cancer Maternal Aunt 60  . Breast cancer Paternal Grandmother 54  . Breast cancer Paternal Aunt 41  . Stroke Brother   . Diabetes Mellitus I Brother      HOME MEDICATIONS: Allergies as of 06/18/2020      Reactions   Amoxicillin-pot Clavulanate Itching   Did it involve swelling of the face/tongue/throat, SOB, or low BP? No Did it involve sudden or severe rash/hives, skin peeling, or any reaction on the inside of your mouth or nose? No Did you need to seek medical attention at a hospital or doctor's office? No When did it last happen?A long time ago If all above answers are "NO", may proceed with cephalosporin use.   Vicodin  [hydrocodone-acetaminophen] Nausea Only      Medication List       Accurate as of Jun 18, 2020  9:49 AM. If you have any questions, ask your nurse or doctor.        Dexcom G6 Sensor Misc Inject 1 Device into the skin as directed. Apply one sensor once every 10 days.   Dexcom G6 Transmitter Misc   IBU 600 MG tablet Generic drug: ibuprofen Take by mouth.   insulin aspart 100 UNIT/ML injection Commonly known as: novoLOG Inject into the skin.   Insulin Pen Needle 32G X 4 MM Misc 1 Device by Does not apply route in the morning, at noon, in the evening, and at bedtime.   UltiCare Mini Pen Needles 31G X 6 MM Misc Generic drug: Insulin Pen Needle USE AS DIRECTED FOUR TIMES DAILY   Lancets Misc Use as instructed   Lantus SoloStar 100 UNIT/ML Solostar Pen Generic drug: insulin glargine Inject 11 Units into the skin daily.   OneTouch Verio Flex System w/Device Kit See admin instructions.   OneTouch Verio test strip Generic drug: glucose blood Use as instructed   pantoprazole 40 MG tablet Commonly known as: PROTONIX Take 1 tablet by mouth daily.        OBJECTIVE:   Vital Signs: BP 124/80   Pulse (!) 56   Ht '5\' 1"'  (1.549 m)   Wt 121 lb (54.9 kg)   SpO2 98%   BMI 22.86 kg/m   Wt Readings from Last 3 Encounters:  06/18/20 121 lb (54.9 kg)  05/19/20 121 lb (54.9 kg)  04/09/20 128 lb (58.1 kg)     Exam: General: Pt appears well and is in NAD  Lungs: Clear with good BS bilat with no rales, rhonchi, or wheezes  Heart: RRR   Abdomen: Normoactive bowel sounds, soft, nontender, incisions clean  Extremities: NO edema  pretibial edema. .  Neuro: MS is good with appropriate affect, pt is alert and Ox3    DM foot exam: 07/23/2019  The skin of the feet is intact without sores or ulcerations. The pedal pulses are 2+ on right and 2+ on left. The sensation is intact to a screening 5.07, 10 gram monofilament bilaterally   DATA REVIEWED:  Lab Results  Component  Value Date   HGBA1C 8.8 (H) 05/11/2020   HGBA1C 8.0 (A) 04/09/2020   HGBA1C 8.3 (H) 01/01/2020   Lab Results  Component Value Date   LDLCALC 73  05/11/2020   CREATININE 0.81 05/11/2020    Lab Results  Component Value Date   CHOL 137 05/11/2020   HDL 52 05/11/2020   LDLCALC 73 05/11/2020   TRIG 55 05/11/2020   CHOLHDL 2.6 05/11/2020         ASSESSMENT / PLAN / RECOMMENDATIONS:   1) Type 1 Diabetes Mellitus, Suboptimally  controlled, Without complications - Most recent A1c of 8.8%. Goal A1c < 7.0 %.  - GAD-65 was elevated at  137 IU/mL - Since her bariatric surgery she has become much more sensitive to insulin.  - Pt has been having hypoglycemia despite reducing her basal insulin, but has severe hyperglycemia postprandial ,. This is partly due to gastric bypass sx and change in absorption etc  - She is interested in Tandem, will refer  - She was also advised to learn carb counting, will refer for a refresher     MEDICATIONS: - Continue Lantus 11 units daily  - Novolog 1-3 units with each meal  - CF : Novolog (BG- 130/60)   EDUCATION / INSTRUCTIONS:  BG monitoring instructions: Patient is instructed to check her blood sugars 4 times a day, before meals and bedtime.  Call [place] Endocr[facility]ology clinic if: BG persistently < 70  . I reviewed the Rule of 15 for the treatment of hypoglycemia in detail with the patient. Literature supplied.     F/U in 4 months    Signed electronically by: Mack Guise, MD  [facility] Endocrinology  [facility] [address] [place], [place] [address] Phone: [phone] FAX: [phone]   CC: Patient, No Pcp Per (Inactive) No address on file Phone: None  Fax: None  Return to Endocrinology clinic as below: Future Appointments  Date Time Provider [place]  08/17/2020 11:10 AM Pamula Luther, Melanie Crazier, MD [id number] None

## 2020-06-18 NOTE — Patient Instructions (Addendum)
-   Continue  Lantus 11  units daily  - Novolog  3  units with each meal  - Novolog correctional insulin: . Use the scale below to help guide you at breakfast, lunch and dinner time   Blood sugar before meal Number of units to inject  Less than 190 0 unit  191 -  250 1 units  251 -  310 2 units  311 -  370 3 units  371 -  430 4 units  431 -  490 5 units  491 -  550 6 units     HOW TO TREAT LOW BLOOD SUGARS (Blood sugar LESS THAN 70 MG/DL)  Please follow the RULE OF 15 for the treatment of hypoglycemia treatment (when your (blood sugars are less than 70 mg/dL)    STEP 1: Take 15 grams of carbohydrates when your blood sugar is low, which includes:   3-4 GLUCOSE TABS  OR  3-4 OZ OF JUICE OR REGULAR SODA OR  ONE TUBE OF GLUCOSE GEL     STEP 2: RECHECK blood sugar in 15 MINUTES STEP 3: If your blood sugar is still low at the 15 minute recheck --> then, go back to STEP 1 and treat AGAIN with another 15 grams of carbohydrates.

## 2020-06-23 ENCOUNTER — Other Ambulatory Visit: Payer: Self-pay

## 2020-06-23 ENCOUNTER — Encounter: Payer: 59 | Attending: Internal Medicine | Admitting: Nutrition

## 2020-06-23 DIAGNOSIS — E1065 Type 1 diabetes mellitus with hyperglycemia: Secondary | ICD-10-CM | POA: Insufficient documentation

## 2020-06-24 ENCOUNTER — Other Ambulatory Visit: Payer: Self-pay | Admitting: Internal Medicine

## 2020-06-24 DIAGNOSIS — E1065 Type 1 diabetes mellitus with hyperglycemia: Secondary | ICD-10-CM

## 2020-06-24 NOTE — Progress Notes (Signed)
Patient is here today to learn about insulin pumps.  We discussed how pumps work, and what is needed to be on a pump.  She says she counts carbs, but can use a review of this.  She is currently wearing a Dexcom sensor, and would like a pump that works with this sensor.  She was shown all 3 pumps and we discussed the advantages and disadvantages of each pump.  She chose the [place] system and wants the new model.  She is in need of ordering this before the end of June.  The rep was called and told of this situation.   The rep agreed to check her insurance coverage, and if Rosann Auerbach does not pay for the new Dash 5, she will get her the old Sharilyn Sites, and upgrade at no cost to her, when insurance is available for this model.Marybelle Killings was filled out and faxed to the rep.  Patient was told to call me when she gets her pump for training.  Note to Dr. Lonzo Cloud for referral for carb counting.

## 2020-06-24 NOTE — Patient Instructions (Signed)
Call to schedule appointment for carb counting.   Call me when you get your pump for training.

## 2020-07-02 ENCOUNTER — Other Ambulatory Visit: Payer: Self-pay | Admitting: Physician Assistant

## 2020-07-02 DIAGNOSIS — Z1231 Encounter for screening mammogram for malignant neoplasm of breast: Secondary | ICD-10-CM

## 2020-07-07 DIAGNOSIS — R059 Cough, unspecified: Secondary | ICD-10-CM | POA: Diagnosis not present

## 2020-07-07 DIAGNOSIS — U071 COVID-19: Secondary | ICD-10-CM | POA: Diagnosis not present

## 2020-07-14 DIAGNOSIS — Z9884 Bariatric surgery status: Secondary | ICD-10-CM | POA: Diagnosis not present

## 2020-07-14 DIAGNOSIS — E109 Type 1 diabetes mellitus without complications: Secondary | ICD-10-CM | POA: Diagnosis not present

## 2020-07-22 ENCOUNTER — Other Ambulatory Visit: Payer: Self-pay

## 2020-07-22 ENCOUNTER — Encounter: Payer: 59 | Attending: Internal Medicine | Admitting: Nutrition

## 2020-07-22 DIAGNOSIS — E1065 Type 1 diabetes mellitus with hyperglycemia: Secondary | ICD-10-CM | POA: Insufficient documentation

## 2020-07-22 DIAGNOSIS — E109 Type 1 diabetes mellitus without complications: Secondary | ICD-10-CM | POA: Insufficient documentation

## 2020-07-23 ENCOUNTER — Telehealth: Payer: Self-pay | Admitting: Nutrition

## 2020-07-23 NOTE — Patient Instructions (Signed)
Read over pump manual Call help line if questions.

## 2020-07-23 NOTE — Progress Notes (Signed)
Patient was trained on how to use the Dash 5 system.  The pump was linked to Dexcom and to [place] [facility] We discussed again how the pump delivers insulin and the need to carb count to use this pump.  She did not see the dietitian for this.  I will train her next week for this.  She also took her Basaglar last night at 11PM (She took 2u less due to lower blood sguar at that time).  He pump was put in a 100% basal reduction mode until 10PM, and she was shown how to manually transfer to the auto mode.  She re demonstrated this correctly X2.  We reveiwed how to bolus, and how to start/stop the pump as well as how this pump will work in the automated mode.  She reported good understanding of this.  Settings were put into the pump by myself and the patient per Dr. Harvel Ricks orders:  Basal rate: 0,35 (pump can not do 0.33), I/C 1 (unitl she learns carb counting), ISF: 60.  We discussed what these numbers mean, as well as IOB.  She reported understanding all of the above topics and signed the checklist as understanding all of those topics.  She had no final questions.

## 2020-07-23 NOTE — Telephone Encounter (Signed)
Patient reported that she had no difficulty last night in switching her pump to the automatic mode after the 100% temp basal reduction mode had ended.  This was at 10PM.   She says she did not bolus for supper, because her blood sugar was only 85 and she ate only 3 bites of a baked potato.  Fasting blood sugar was 101 today.  She drank a protein shake for breakfast and did not take any insulin, and it was 187 1hr. Pc.  She looked on the label, and it did have carbs in it.  She scheduled an appointment with me for carb counting next Wednesday.  She had no questions for me at this time.

## 2020-07-23 NOTE — Telephone Encounter (Signed)
Message left on machine that I will call her tomorrow morning, and to call the help line if questions.

## 2020-07-27 ENCOUNTER — Telehealth: Payer: Self-pay | Admitting: Nutrition

## 2020-07-27 NOTE — Telephone Encounter (Signed)
Patient reported no difficulty changing pods and sensors.  Blood sugar went high over the weekend and kicked her out of automated mode, because she did not have her PDM while ate, and ate a meal..blood sugars went very high for more than 2 hours.  "Other than that, she is liking this and thinks it is helping her.  She had no questions for me at this time.

## 2020-07-29 ENCOUNTER — Encounter: Payer: 59 | Admitting: Nutrition

## 2020-07-29 DIAGNOSIS — E1065 Type 1 diabetes mellitus with hyperglycemia: Secondary | ICD-10-CM | POA: Diagnosis not present

## 2020-07-29 DIAGNOSIS — E109 Type 1 diabetes mellitus without complications: Secondary | ICD-10-CM | POA: Diagnosis not present

## 2020-07-30 ENCOUNTER — Ambulatory Visit: Payer: Self-pay | Admitting: Adult Health

## 2020-07-30 ENCOUNTER — Ambulatory Visit
Admission: RE | Admit: 2020-07-30 | Discharge: 2020-07-30 | Disposition: A | Discharge status: Home / Self Care | Payer: 59 | Source: Ambulatory Visit | Attending: Physician Assistant | Admitting: Physician Assistant

## 2020-07-30 ENCOUNTER — Encounter: Payer: Self-pay | Admitting: Adult Health

## 2020-07-30 ENCOUNTER — Other Ambulatory Visit: Payer: Self-pay

## 2020-07-30 VITALS — BP 146/76 | HR 64 | Temp 97.3°F | Resp 12

## 2020-07-30 DIAGNOSIS — Z1231 Encounter for screening mammogram for malignant neoplasm of breast: Secondary | ICD-10-CM | POA: Diagnosis not present

## 2020-07-30 DIAGNOSIS — K625 Hemorrhage of anus and rectum: Secondary | ICD-10-CM

## 2020-07-30 NOTE — Patient Instructions (Signed)
Please read over handouts/brochures given  Call if questions.

## 2020-07-30 NOTE — Progress Notes (Signed)
[facility] [place] [address], [place] [address]   Office Visit Note  Patient Name: Lori Carr  [id number]  [id number]  Date of Service: 07/30/2020  Chief Complaint  Patient presents with   Rectal Bleeding     HPI Pt is here for a sick visit.  She report last week she noticed some odd stools, as well as some blood on the tissue.  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  The stools appearing watery, and oily.  She reports this lasted for 2 days, and the blood seemed to taper off.  She has a GI doctor that she saw for her pre op appointments.  She took pictures of her stools and has them on her phone in clinic. It has resolved as of today.      Current Medication:  Outpatient Encounter Medications as of 07/30/2020  Medication Sig Note   Blood Glucose Monitoring Suppl ([place]) w/Device KIT See admin instructions.    Continuous Blood Gluc Sensor (DEXCOM G6 SENSOR) MISC Inject 1 Device into the skin as directed. Apply one sensor once every 10 days.    Continuous Blood Gluc Transmit (DEXCOM G6 TRANSMITTER) MISC     glucose blood (ONETOUCH VERIO) test strip Use as instructed    insulin aspart (NOVOLOG) 100 UNIT/ML injection Inject into the skin.    Insulin Disposable Pump (OMNIPOD 5 G6 INTRO, GEN 5,) KIT     Insulin Disposable Pump (OMNIPOD 5 G6 POD, GEN 5,) MISC     Insulin Disposable Pump (OMNIPOD 5 G6 POD, GEN 5,) MISC Inject into the skin.    IBU 600 MG tablet Take by mouth. 07/30/2020: Takes prn   insulin glargine (LANTUS SOLOSTAR) 100 UNIT/ML Solostar Pen Inject 11 Units into the skin daily. (Patient not taking: Reported on 07/30/2020)    Insulin Pen Needle 32G X 4 MM MISC 1 Device by Does not apply route in the morning, at noon, in the evening, and at bedtime. (Patient not taking: Reported on 07/30/2020)    Lancets MISC Use as instructed (Patient not taking: Reported on 07/30/2020)    pantoprazole (PROTONIX) 40 MG tablet Take 1 tablet by mouth  daily. 07/30/2020: Takes prn   ULTICARE MINI PEN NEEDLES 31G X 6 MM MISC USE AS DIRECTED FOUR TIMES DAILY (Patient not taking: Reported on 07/30/2020)    No facility-administered encounter medications on file as of 07/30/2020.      Medical History: Past Medical History:  Diagnosis Date   chronic vaginal bleeding    Diabetes mellitus    type 2   DKA (diabetic ketoacidoses)    History of Elevated LDL cholesterol level    Obesity      Vital Signs: BP (!) 146/76 (BP Location: Left Arm, Patient Position: Sitting, Cuff Size: Normal)   Pulse 64   Temp (!) 97.3 F (36.3 C) (Temporal)   Resp 12   SpO2 98%    Review of Systems  Constitutional:  Negative for activity change, appetite change, chills, diaphoresis and fever.  Gastrointestinal:  Positive for anal bleeding. Negative for abdominal distention, abdominal pain, blood in stool, constipation, diarrhea and rectal pain.   Physical Exam Constitutional:      Appearance: Normal appearance.  Cardiovascular:     Rate and Rhythm: Normal rate.  Abdominal:     General: Abdomen is flat.     Palpations: Abdomen is soft.  Genitourinary:    Comments: Guiac deferred.  Neurological:  Mental Status: She is alert.    Assessment/Plan: 1. Rectal bleeding Self limiting and likely hemorrhoid.  Pt will make appt to see GI, if symptoms return.  She denies any issues currently.       General Counseling: Ryin verbalizes understanding of the findings of todays visit and agrees with plan of treatment. I have discussed any further diagnostic evaluation that may be needed or ordered today. We also reviewed her medications today. she has been encouraged to call the office with any questions or concerns that should arise related to todays visit.   No orders of the defined types were placed in this encounter.   No orders of the defined types were placed in this encounter.   Time spent:30 Minutes    Kendell Bane AGNP-C Nurse  Practitioner

## 2020-07-30 NOTE — Progress Notes (Signed)
Patient is here today to learn carb counting.  We reviewed what carbs were and what carbs she is eating.  Meal sizes of carbs range from 5-60 grams wien eating larger portions of foods she likes, like 2 ears of corn (60 grams).  Pt. Reported that she does vary the amount of carbs with each meal, and realized that this is a more precise way to deliver the insulin. She was given 3 booklets on carb counting, as well as a handout of what carbs are,  15 gram portions sizes.   She also downloaded the [employer] app for when she is eating out.  Her PDM was changed tp ISF of  20 per Dr. Harvel Ricks initial pump start orders.   Patient did several boluses using different amounts of carbs and blood sugar readings and said the amounts for meals were acceptable to her.  She had no final questions.

## 2020-07-30 NOTE — Progress Notes (Signed)
Rectal bleeding on Sunday/Monday - bright blood & then next time not as red.  Blood on tissue & in toilet.  Occurred with bowel movement.  No straining.  Stool formed & soft   No bleeding today.  Had a BM today.  States BM's haven't been normal for her since the Surgery last June 2021.  AMD

## 2020-07-31 ENCOUNTER — Telehealth: Payer: Self-pay | Admitting: Nutrition

## 2020-07-31 NOTE — Telephone Encounter (Signed)
Patient reports that she is using carb counting for her bolus calculations, and it is working "so much better".  Says blood sugars are much better and no lows for 2 days.  She had no questions for me at this time.

## 2020-08-04 ENCOUNTER — Encounter: Payer: Self-pay | Admitting: Internal Medicine

## 2020-08-16 NOTE — Progress Notes (Signed)
Name: Lori Carr  Age/ Sex: 55 y.o., female   MRN/ DOB: [id number], December 25, 1965     PCP: Sable Feil, PA-C   Reason for Endocrinology Evaluation: Type 1 Diabetes Mellitus  Initial Endocrine Consultative Visit: 07/06/2018    PATIENT IDENTIFIER: [name] is a 55 y.o. 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 The patient has followed with Endocrinology clinic since 07/06/2018 for consultative assistance with management of her diabetes.  DIABETIC HISTORY:  [name] was initially diagnosed with T2DM in 2007, she has been on MDI regimen since her diagnosis, she also was on Metformin at somepoint and was subsequently switched to [place] (xigduo) which was stopped after hospitalization in 06/2018 for DKA. GAD-65 was elevated at  137 IU/mL which confirmed a diagnosis of T1DM . Her hemoglobin A1c has ranged from 7.9% in 2015, peaking at 9.2% in 2020.  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 We adjusted MDI regimen    Today (08/17/2020): [name] is here for a follow up on diabetes.  She checks her blood sugars multiple times a day through the CGM  . She has had hypoglycemic episodes since her surgery      Improved vomiting  Denies constipation or diarrhea  Denies abdominal pain   HOME DIABETES REGIMEN:  Novolog   This patient with type 1 diabetes is treated with Omnipod (insulin pump). During the visit the pump basal and bolus doses were reviewed including carb/insulin rations and supplemental doses. The clinical list was updated. The glucose meter download was reviewed in detail to determine if the current pump settings are providing the best glycemic control without excessive  hypoglycemia.  Pump and meter download:    Pump   Omnipod 5 Settings   Insulin type   Novolog    Basal rate       0000 0.35 u/h               I:C ratio       0000 1:20                  Sensitivity       0000  60      Goal       0000  70-110             Type & Model of Pump: Omnipod 5 Insulin Type: Currently using Novolog .  PUMP STATISTICS: Average BG: 208 BG Readings: 0.4 / day Average Daily Carbs (g): 58 Average Total Daily Insulin: 12.4 Average Daily Basal: 6.9 (56%) Average Daily Bolus: 5.5 (44%)        HISTORY:  Past Medical History:  Past Medical History:  Diagnosis Date   chronic vaginal bleeding    Diabetes mellitus    type 2   DKA (diabetic ketoacidoses)    History of Elevated LDL cholesterol level  Obesity    Past Surgical History:  Past Surgical History:  Procedure Laterality Date   CESAREAN SECTION     x3   COLONOSCOPY  2018   DILITATION & CURRETTAGE/HYSTROSCOPY WITH NOVASURE ABLATION N/A 07/03/2018   Procedure: DILATATION & CURETTAGE/HYSTEROSCOPY WITH Possible NOVASURE ABLATION;  Surgeon: Brien Few, MD;  Location: [place];  Service: Gynecology;  Laterality: N/A;   LAPAROSCOPIC GASTRIC RESTRICTIVE DUODENAL PROCEDURE (DUODENAL SWITCH) N/A 07/11/2019   tissue removed from stomach  as child   TYMPANOSTOMY TUBE PLACEMENT  as child   WRIST SURGERY Right 03/29/2019   Social History:  reports that she has never smoked. She has never used smokeless tobacco. She reports that she does not drink alcohol and does not use drugs. Family History:  Family History  Problem Relation Age of Onset   Ovarian cancer Mother 27       deceased age 77 stage IV ovarian cancer   Breast cancer Maternal Aunt 20   Breast cancer Paternal Grandmother 55   Breast cancer Paternal Aunt 58   Stroke Brother    Diabetes Mellitus I Brother      HOME MEDICATIONS: Allergies as of 08/17/2020       Reactions   Amoxicillin-pot  Clavulanate Itching   Did it involve swelling of the face/tongue/throat, SOB, or low BP? No Did it involve sudden or severe rash/hives, skin peeling, or any reaction on the inside of your mouth or nose? No Did you need to seek medical attention at a hospital or doctor's office? No When did it last happen?  A long time ago     If all above answers are "NO", may proceed with cephalosporin use.   Vicodin [hydrocodone-acetaminophen] Nausea Only        Medication List        Accurate as of August 17, 2020  5:26 PM. If you have any questions, ask your nurse or doctor.          Dexcom G6 Sensor Misc Inject 1 Device into the skin as directed. Apply one sensor once every 10 days.   Dexcom G6 Transmitter Misc   IBU 600 MG tablet Generic drug: ibuprofen Take by mouth.   insulin aspart 100 UNIT/ML injection Commonly known as: novoLOG Inject into the skin.   Insulin Pen Needle 32G X 4 MM Misc 1 Device by Does not apply route in the morning, at noon, in the evening, and at bedtime.   UltiCare Mini Pen Needles 31G X 6 MM Misc Generic drug: Insulin Pen Needle USE AS DIRECTED FOUR TIMES DAILY   Lancets Misc Use as instructed   Lantus SoloStar 100 UNIT/ML Solostar Pen Generic drug: insulin glargine Inject 11 Units into the skin daily.   Omnipod 5 G6 Intro (Gen 5) Kit   Omnipod 5 G6 Pod (Gen 5) Misc   Omnipod 5 G6 Pod (Gen 5) Misc Inject into the skin.   OneTouch Verio Flex System w/Device Kit See admin instructions.   OneTouch Verio test strip Generic drug: glucose blood Use as instructed   pantoprazole 40 MG tablet Commonly known as: PROTONIX Take 1 tablet by mouth daily.         OBJECTIVE:   Vital Signs: BP (!) 144/64   Pulse 65   Ht '5\' 1"'  (1.549 m)   Wt 117 lb (53.1 kg)   SpO2 99%   BMI 22.11 kg/m   Wt Readings from Last 3 Encounters:  08/17/20 117 lb (53.1 kg)  06/18/20 121 lb (  54.9 kg)  05/19/20 121 lb (54.9 kg)     Exam: General: Pt appears  well and is in NAD  Lungs: Clear with good BS bilat with no rales, rhonchi, or wheezes  Heart: RRR   Abdomen: Normoactive bowel sounds, soft, nontender, incisions clean  Extremities: NO edema  pretibial edema. .  Neuro: MS is good with appropriate affect, pt is alert and Ox3    DM foot exam: 07/23/2019   The skin of the feet is intact without sores or ulcerations. The pedal pulses are 2+ on right and 2+ on left. The sensation is intact to a screening 5.07, 10 gram monofilament bilaterally   DATA REVIEWED:  Lab Results  Component Value Date   HGBA1C 8.8 (H) 05/11/2020   HGBA1C 8.0 (A) 04/09/2020   HGBA1C 8.3 (H) 01/01/2020   Lab Results  Component Value Date   LDLCALC 73 05/11/2020   CREATININE 0.81 05/11/2020    Lab Results  Component Value Date   CHOL 137 05/11/2020   HDL 52 05/11/2020   LDLCALC 73 05/11/2020   TRIG 55 05/11/2020   CHOLHDL 2.6 05/11/2020         ASSESSMENT / PLAN / RECOMMENDATIONS:   1) Type 1 Diabetes Mellitus, Suboptimally  controlled, Without complications - Most recent A1c of 8.8%. Goal A1c < 7.0 %.  - GAD-65 was elevated at  137 IU/mL - Since her bariatric surgery she has become much more sensitive to insulin.  -She has been liking the OmniPod 5, she does admit to over estimating carb counting and at times not entering carbohydrates due to fear of hypoglycemia, I have encouraged her to start counting her carbohydrates and entering them into the pump so we can make the appropriate adjustments.  At this time there is not a whole lot of glucose readings on the OmniPod download, but her BG's range from 10 5-400. Mg/dL  No changes today   MEDICATIONS: -NovoLog   Pump   Omnipod 5 Settings   Insulin type   Novolog    Basal rate       0000 0.35 u/h               I:C ratio       0000 1:20                  Sensitivity       0000  60      Goal       0000  70-110           EDUCATION / INSTRUCTIONS: BG monitoring instructions:  Patient is instructed to check her blood sugars 4 times a day, before meals and bedtime. Call [facility] if: BG persistently < 70  I reviewed the Rule of 15 for the treatment of hypoglycemia in detail with the patient. Literature supplied.     F/U in 3 months    Signed electronically by: Mack Guise, MD  [facility] Endocrinology  [facility] [address] [place], [place] [address] Phone: [phone] FAX: [phone]   CC: [address] [place] [address] Phone: [phone]  Fax: [phone]  Return to Endocrinology clinic as below: Future Appointments  Date Time Provider [place]  12/18/2020 11:10 AM Michela Herst, Melanie Crazier, MD [facility] None

## 2020-08-17 ENCOUNTER — Encounter: Payer: Self-pay | Admitting: Internal Medicine

## 2020-08-17 ENCOUNTER — Other Ambulatory Visit: Payer: Self-pay

## 2020-08-17 ENCOUNTER — Ambulatory Visit (INDEPENDENT_AMBULATORY_CARE_PROVIDER_SITE_OTHER): Payer: 59 | Admitting: Internal Medicine

## 2020-08-17 VITALS — BP 144/64 | HR 65 | Ht 61.0 in | Wt 117.0 lb

## 2020-08-17 DIAGNOSIS — S92413A Displaced fracture of proximal phalanx of unspecified great toe, initial encounter for closed fracture: Secondary | ICD-10-CM | POA: Insufficient documentation

## 2020-08-17 DIAGNOSIS — E1065 Type 1 diabetes mellitus with hyperglycemia: Secondary | ICD-10-CM | POA: Diagnosis not present

## 2020-08-17 DIAGNOSIS — M79675 Pain in left toe(s): Secondary | ICD-10-CM | POA: Diagnosis not present

## 2020-08-17 DIAGNOSIS — S92515A Nondisplaced fracture of proximal phalanx of left lesser toe(s), initial encounter for closed fracture: Secondary | ICD-10-CM | POA: Insufficient documentation

## 2020-09-15 ENCOUNTER — Other Ambulatory Visit: Payer: Self-pay

## 2020-09-15 ENCOUNTER — Ambulatory Visit: Payer: Self-pay

## 2020-09-15 VITALS — BP 168/79 | HR 57

## 2020-09-15 DIAGNOSIS — Z0130 Encounter for examination of blood pressure without abnormal findings: Secondary | ICD-10-CM

## 2020-09-15 NOTE — Progress Notes (Signed)
Pt presents today for BP check. Pt states she hurt her ribs this morning reaching in her car; She hurd a pop and now its sore to the touch. I told pt later coming back to recheck BP that she needs to consider getting checked out. CL,RMA

## 2020-10-06 ENCOUNTER — Ambulatory Visit: Payer: Self-pay

## 2020-10-06 ENCOUNTER — Other Ambulatory Visit: Payer: Self-pay

## 2020-10-06 VITALS — BP 144/76 | HR 58

## 2020-10-06 DIAGNOSIS — Z0130 Encounter for examination of blood pressure without abnormal findings: Secondary | ICD-10-CM

## 2020-10-06 NOTE — Progress Notes (Signed)
Pt was told to come 2 more times this week and on the third visit, she will meet with the provider.CL,RMA

## 2020-10-07 ENCOUNTER — Ambulatory Visit: Payer: Self-pay

## 2020-10-07 VITALS — BP 144/76 | HR 59

## 2020-10-07 DIAGNOSIS — Z0130 Encounter for examination of blood pressure without abnormal findings: Secondary | ICD-10-CM

## 2020-10-07 NOTE — Progress Notes (Signed)
Pt presents today for 2nd BP check.  

## 2020-10-08 ENCOUNTER — Ambulatory Visit: Payer: Self-pay

## 2020-10-08 ENCOUNTER — Other Ambulatory Visit: Payer: Self-pay

## 2020-10-08 VITALS — BP 140/82 | HR 66

## 2020-10-08 DIAGNOSIS — Z0130 Encounter for examination of blood pressure without abnormal findings: Secondary | ICD-10-CM

## 2020-10-08 NOTE — Progress Notes (Signed)
Pt presented for 3nd BP check. Manual 140/88

## 2020-11-02 ENCOUNTER — Other Ambulatory Visit: Payer: Self-pay

## 2020-11-02 ENCOUNTER — Ambulatory Visit: Payer: Self-pay | Admitting: Physician Assistant

## 2020-11-02 VITALS — BP 154/83 | HR 59

## 2020-11-02 DIAGNOSIS — Z0130 Encounter for examination of blood pressure without abnormal findings: Secondary | ICD-10-CM

## 2020-11-04 ENCOUNTER — Other Ambulatory Visit: Payer: Self-pay

## 2020-11-04 ENCOUNTER — Ambulatory Visit: Payer: Self-pay

## 2020-11-04 VITALS — BP 147/83 | HR 58

## 2020-11-04 DIAGNOSIS — Z0130 Encounter for examination of blood pressure without abnormal findings: Secondary | ICD-10-CM

## 2020-11-04 NOTE — Progress Notes (Signed)
2nd BP check./CL,RMA

## 2020-11-05 ENCOUNTER — Ambulatory Visit: Payer: Self-pay

## 2020-11-05 VITALS — BP 140/78 | HR 59

## 2020-11-05 DIAGNOSIS — Z0130 Encounter for examination of blood pressure without abnormal findings: Secondary | ICD-10-CM

## 2020-11-05 NOTE — Progress Notes (Signed)
BP slight elevated./CL,RMA

## 2020-11-10 ENCOUNTER — Ambulatory Visit
Admission: RE | Admit: 2020-11-10 | Discharge: 2020-11-10 | Disposition: A | Discharge status: Home / Self Care | Payer: 59 | Attending: Physician Assistant | Admitting: Physician Assistant

## 2020-11-10 ENCOUNTER — Other Ambulatory Visit: Payer: Self-pay

## 2020-11-10 ENCOUNTER — Encounter: Payer: Self-pay | Admitting: Physician Assistant

## 2020-11-10 ENCOUNTER — Ambulatory Visit
Admission: RE | Admit: 2020-11-10 | Discharge: 2020-11-10 | Disposition: A | Discharge status: Home / Self Care | Payer: 59 | Source: Ambulatory Visit | Attending: Physician Assistant | Admitting: Physician Assistant

## 2020-11-10 ENCOUNTER — Ambulatory Visit: Payer: Self-pay | Admitting: Physician Assistant

## 2020-11-10 VITALS — BP 163/88 | HR 59 | Temp 97.7°F | Resp 12 | Ht 61.0 in | Wt 112.0 lb

## 2020-11-10 DIAGNOSIS — R0781 Pleurodynia: Secondary | ICD-10-CM

## 2020-11-10 DIAGNOSIS — S20212A Contusion of left front wall of thorax, initial encounter: Secondary | ICD-10-CM | POA: Diagnosis not present

## 2020-11-10 MED ORDER — LIDOCAINE 5 % EX PTCH: 1.0000 | MEDICATED_PATCH | CUTANEOUS | 0 refills | Status: DC

## 2020-11-10 MED ORDER — ONDANSETRON HCL 8 MG PO TABS: 8.0000 mg | ORAL_TABLET | Freq: Three times a day (TID) | ORAL | 0 refills | Status: DC | PRN

## 2020-11-10 MED ORDER — LISINOPRIL 10 MG PO TABS: 10.0000 mg | ORAL_TABLET | Freq: Every day | ORAL | 3 refills | Status: AC

## 2020-11-10 MED ORDER — OXYCODONE-ACETAMINOPHEN 7.5-325 MG PO TABS: 1.0000 | ORAL_TABLET | ORAL | 0 refills | Status: DC | PRN

## 2020-11-10 NOTE — Progress Notes (Signed)
   Subjective: Left rib pain    Patient ID: Lori Carr, female    DOB: 09/07/1965, 55 y.o.   MRN: [id number]  HPI Patient states she was bathing her grandson last night when she leaned over top and hit her ribs on the edge of the tub.  Patient states pain increases with deep inspiration.  Rib is tender to palpation.  Rates pain as 5/10.  Described pain as "achy".  No palliative measure for complaint.   Review of Systems Diabetes and GERD    Objective:   Physical Exam  Mild distress.  Temperature 97.7, pulse 59, respiration 12, BP is 163/88, patient 1% O2 sat on room air.  Patient was given 12 pounds. No obvious deformity to the left ribs.  Patient has moderate guarding with palpation ribs 10 through 12.  Also noticed the patient blood pressure has increased the elevated lipase 2 months.      Assessment & Plan: Left rib pain.   Further evaluation with an x-ray is warranted.  Patient given Lidoderm patch, Percocet, Zofran, and will start lisinopril at 10 mg daily.  Patient will follow-up tomorrow to discuss x-ray results.

## 2020-12-18 ENCOUNTER — Ambulatory Visit: Payer: 59 | Admitting: Internal Medicine

## 2020-12-18 ENCOUNTER — Telehealth: Payer: Self-pay | Admitting: Dietician

## 2020-12-18 ENCOUNTER — Ambulatory Visit: Payer: Self-pay

## 2020-12-18 VITALS — BP 145/82 | HR 85 | Resp 14

## 2020-12-18 DIAGNOSIS — Z0130 Encounter for examination of blood pressure without abnormal findings: Secondary | ICD-10-CM

## 2020-12-18 NOTE — Telephone Encounter (Signed)
Patient called and stated that she had gotten a new PDM for her Omnipod 5 as she had cracked the old one.  She has the settings but it needs a username. She is still able to use the cracked PDM but it is getting worse.  Instructed her to try her first initial and last name for the username and [email or web address]! For the password. Discussed that their are covers for the PDM for sale on line.  Message left for Bonita Quin, RN, CDCES for any other questions patient may have.  Oran Rein, RD, LDN, CDCES

## 2020-12-18 NOTE — Progress Notes (Deleted)
Name: Lori Carr  Age/ Sex: 55 y.o., female   MRN/ DOB: [id number], 1965/02/27     PCP: Sable Feil, PA-C   Reason for Endocrinology Evaluation: Type 1 Diabetes Mellitus  Initial Endocrine Consultative Visit: 07/06/2018    PATIENT IDENTIFIER: [name] is a 55 y.o. 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 The patient has followed with Endocrinology clinic since 07/06/2018 for consultative assistance with management of her diabetes.  DIABETIC HISTORY:  Lori Carr was initially diagnosed with T2DM in 2007, she has been on MDI regimen since her diagnosis, she also was on Metformin at somepoint and was subsequently switched to [place] (xigduo) which was stopped after hospitalization in 06/2018 for DKA. GAD-65 was elevated at  137 IU/mL which confirmed a diagnosis of T1DM . Her hemoglobin A1c has ranged from 7.9% in 2015, peaking at 9.2% in 2020.  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 We adjusted MDI regimen    Today (12/18/2020): Ms. Strubel is here for a follow up on diabetes.  She checks her blood sugars multiple times a day through the CGM  . She has had hypoglycemic episodes since her surgery      Improved vomiting  Denies constipation or diarrhea  Denies abdominal pain   HOME DIABETES REGIMEN:  Novolog   This patient with type 1 diabetes is treated with Omnipod (insulin pump). During the visit the pump basal and bolus doses were reviewed including carb/insulin rations and supplemental doses. The clinical list was updated. The glucose meter download was reviewed in detail to determine if the current pump settings are providing the best glycemic control without excessive  hypoglycemia.  Pump and meter download:    Pump   Omnipod 5 Settings   Insulin type   Novolog    Basal rate       0000 0.35 u/h               I:C ratio       0000 1:20                  Sensitivity       0000  60      Goal       0000  70-110             Type & Model of Pump: Omnipod 5 Insulin Type: Currently using Novolog .  PUMP STATISTICS: Average BG: 208 BG Readings: 0.4 / day Average Daily Carbs (g): 58 Average Total Daily Insulin: 12.4 Average Daily Basal: 6.9 (56%) Average Daily Bolus: 5.5 (44%)        HISTORY:  Past Medical History:  Past Medical History:  Diagnosis Date   chronic vaginal bleeding    Diabetes mellitus    type 2   DKA (diabetic ketoacidoses)    History of Elevated LDL cholesterol level  Obesity    Past Surgical History:  Past Surgical History:  Procedure Laterality Date   CESAREAN SECTION     x3   COLONOSCOPY  2018   DILITATION & CURRETTAGE/HYSTROSCOPY WITH NOVASURE ABLATION N/A 07/03/2018   Procedure: DILATATION & CURETTAGE/HYSTEROSCOPY WITH Possible NOVASURE ABLATION;  Surgeon: Brien Few, MD;  Location: [place];  Service: Gynecology;  Laterality: N/A;   LAPAROSCOPIC GASTRIC RESTRICTIVE DUODENAL PROCEDURE (DUODENAL SWITCH) N/A 07/11/2019   tissue removed from stomach  as child   TYMPANOSTOMY TUBE PLACEMENT  as child   WRIST SURGERY Right 03/29/2019   Social History:  reports that she has never smoked. She has never used smokeless tobacco. She reports that she does not drink alcohol and does not use drugs. Family History:  Family History  Problem Relation Age of Onset   Ovarian cancer Mother 62       deceased age 44 stage IV ovarian cancer   Breast cancer Maternal Aunt 56   Breast cancer Paternal Grandmother 64   Breast cancer Paternal Aunt 17   Stroke Brother    Diabetes Mellitus I Brother      HOME MEDICATIONS: Allergies as of 12/18/2020       Reactions   Amoxicillin-pot  Clavulanate Itching   Did it involve swelling of the face/tongue/throat, SOB, or low BP? No Did it involve sudden or severe rash/hives, skin peeling, or any reaction on the inside of your mouth or nose? No Did you need to seek medical attention at a hospital or doctor's office? No When did it last happen?  A long time ago     If all above answers are "NO", may proceed with cephalosporin use.   Vicodin [hydrocodone-acetaminophen] Nausea Only        Medication List        Accurate as of December 18, 2020  7:01 AM. If you have any questions, ask your nurse or doctor.          Dexcom G6 Sensor Misc Inject 1 Device into the skin as directed. Apply one sensor once every 10 days.   Dexcom G6 Transmitter Misc   IBU 600 MG tablet Generic drug: ibuprofen Take by mouth.   insulin aspart 100 UNIT/ML injection Commonly known as: novoLOG Inject into the skin.   Insulin Pen Needle 32G X 4 MM Misc 1 Device by Does not apply route in the morning, at noon, in the evening, and at bedtime.   UltiCare Mini Pen Needles 31G X 6 MM Misc Generic drug: Insulin Pen Needle USE AS DIRECTED FOUR TIMES DAILY   Lancets Misc Use as instructed   Lantus SoloStar 100 UNIT/ML Solostar Pen Generic drug: insulin glargine Inject 11 Units into the skin daily.   lidocaine 5 % Commonly known as: Lidoderm Place 1 patch onto the skin daily. Remove & Discard patch within 12 hours or as directed by MD   lisinopril 10 MG tablet Commonly known as: ZESTRIL Take 1 tablet (10 mg total) by mouth daily.   Omnipod 5 G6 Intro (Gen 5) Kit   Omnipod 5 G6 Pod (Gen 5) Misc Inject into the skin.   ondansetron 8 MG tablet Commonly known as: Zofran Take 1 tablet (8 mg total) by mouth every 8 (eight) hours as needed for nausea or vomiting.   OneTouch Verio Flex System w/Device Kit See admin instructions.   OneTouch Verio test strip Generic drug: glucose blood Use as instructed   oxyCODONE-acetaminophen  7.5-325 MG tablet Commonly known as: Percocet  Take 1 tablet by mouth every 4 (four) hours as needed for severe pain.   pantoprazole 40 MG tablet Commonly known as: PROTONIX Take 1 tablet by mouth daily.         OBJECTIVE:   Vital Signs: There were no vitals taken for this visit.  Wt Readings from Last 3 Encounters:  11/10/20 112 lb (50.8 kg)  08/17/20 117 lb (53.1 kg)  06/18/20 121 lb (54.9 kg)     Exam: General: Pt appears well and is in NAD  Lungs: Clear with good BS bilat with no rales, rhonchi, or wheezes  Heart: RRR   Abdomen: Normoactive bowel sounds, soft, nontender, incisions clean  Extremities: NO edema  pretibial edema. .  Neuro: MS is good with appropriate affect, pt is alert and Ox3    DM foot exam: 07/23/2019   The skin of the feet is intact without sores or ulcerations. The pedal pulses are 2+ on right and 2+ on left. The sensation is intact to a screening 5.07, 10 gram monofilament bilaterally   DATA REVIEWED:  Lab Results  Component Value Date   HGBA1C 8.8 (H) 05/11/2020   HGBA1C 8.0 (A) 04/09/2020   HGBA1C 8.3 (H) 01/01/2020   Lab Results  Component Value Date   LDLCALC 73 05/11/2020   CREATININE 0.81 05/11/2020    Lab Results  Component Value Date   CHOL 137 05/11/2020   HDL 52 05/11/2020   LDLCALC 73 05/11/2020   TRIG 55 05/11/2020   CHOLHDL 2.6 05/11/2020         ASSESSMENT / PLAN / RECOMMENDATIONS:   1) Type 1 Diabetes Mellitus, Suboptimally  controlled, Without complications - Most recent A1c of 8.8%. Goal A1c < 7.0 %.  - GAD-65 was elevated at  137 IU/mL - Since her bariatric surgery she has become much more sensitive to insulin.  -She has been liking the OmniPod 5, she does admit to over estimating carb counting and at times not entering carbohydrates due to fear of hypoglycemia, I have encouraged her to start counting her carbohydrates and entering them into the pump so we can make the appropriate adjustments.  At this time  there is not a whole lot of glucose readings on the OmniPod download, but her BG's range from 10 5-400. Mg/dL  No changes today   MEDICATIONS: -NovoLog   Pump   Omnipod 5 Settings   Insulin type   Novolog    Basal rate       0000 0.35 u/h               I:C ratio       0000 1:20                  Sensitivity       0000  60      Goal       0000  70-110           EDUCATION / INSTRUCTIONS: BG monitoring instructions: Patient is instructed to check her blood sugars 4 times a day, before meals and bedtime. Call [place] [facility] clinic if: BG persistently < 70  I reviewed the Rule of 15 for the treatment of hypoglycemia in detail with the patient. Literature supplied.     F/U in 3 months    Signed electronically by: Mack Guise, MD  [facility] Endocrinology  [facility] [address] [place], [place] [address] Phone: [phone] FAX: [phone]   CC: Mardee Postin  K, PA-C [address] [place] [address] Phone: [phone]  Fax: [phone]  Return to Endocrinology clinic as below: Future Appointments  Date Time Provider [place]  12/18/2020 11:10 AM Kalista Laguardia, Melanie Crazier, MD [id number] None

## 2021-01-13 DIAGNOSIS — Z9884 Bariatric surgery status: Secondary | ICD-10-CM | POA: Diagnosis not present

## 2021-01-13 DIAGNOSIS — E119 Type 2 diabetes mellitus without complications: Secondary | ICD-10-CM | POA: Diagnosis not present

## 2021-01-13 DIAGNOSIS — R112 Nausea with vomiting, unspecified: Secondary | ICD-10-CM | POA: Diagnosis not present

## 2021-01-20 NOTE — Progress Notes (Signed)
Name: Lori Carr  Age/ Sex: 55 y.o., female   MRN/ DOB: [id number], 1965/05/07     PCP: Sable Feil, PA-C   Reason for Endocrinology Evaluation: Type 1 Diabetes Mellitus  Initial Endocrine Consultative Visit: 07/06/2018    Lori Carr IDENTIFIER: Lori Carr is a 55 y.o. 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 The Lori Carr has followed with Endocrinology clinic since 07/06/2018 for consultative assistance with management of her diabetes.     DIABETIC HISTORY:  Lori Carr was initially diagnosed with T2DM in 2007, she has been on MDI regimen since her diagnosis, she also was on Metformin at somepoint and was subsequently switched to [place] (xigduo) which was stopped after hospitalization in 06/2018 for DKA. GAD-65 was elevated at  137 IU/mL which confirmed a diagnosis of T1DM . Her hemoglobin A1c has ranged from 7.9% in 2015, peaking at 9.2% in 2020.  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 07/2020  Brother with T1DM  SUBJECTIVE:   During the last visit (08/17/2020): A1c 8.8 %. We adjusted MDI regimen    Today (01/21/2021): Lori Carr is here for a follow up on diabetes.  She checks her blood sugars multiple times a day through the CGM  . She has had hypoglycemic episodes since her surgery      Rare  vomiting  Denies constipation or diarrhea    HOME DIABETES REGIMEN:  Novolog   This Lori Carr with type 1 diabetes is treated with Omnipod (insulin pump). During the visit the pump basal and bolus doses were reviewed including carb/insulin rations and supplemental doses. The clinical list was updated. The glucose meter download was reviewed in detail to determine if the current pump settings are providing the best glycemic control without excessive hypoglycemia.  Pump and  meter download:    Pump   Omnipod 5 Settings   Insulin type   Novolog    Basal rate       0000 0.35 u/h               I:C ratio       0000 1:20                  Sensitivity       0000  60      Goal       0000  120            Type & Model of Pump: Omnipod 5 Insulin Type: Currently using Novolog .  PUMP STATISTICS: Average BG: no data Average Daily Carbs (g): 27 Average Total Daily Insulin: 16.0 Average Daily Basal: 10.5 (66%) Average Daily Bolus: 5.5 (34%)  Over-rides 55 % of boluses    CONTINUOUS GLUCOSE MONITORING RECORD INTERPRETATION    Dates of Recording: 12/9-12/22/2022  Sensor description: dexcom  Results statistics:   CGM use % of time 93  Average and SD 267/106  Time in range     23   %  %  Time Above 180 20  % Time above 250 54  % Time Below target 2    Glycemic patterns summary: Hyperglycemia noted during the day and night   Hyperglycemic episodes  day and night   Hypoglycemic episodes occurred during the day after a bolus  Overnight periods: high       HISTORY:  Past Medical History:  Past Medical History:  Diagnosis Date   chronic vaginal bleeding    Diabetes mellitus    type 2   DKA (diabetic ketoacidoses)    History of Elevated LDL cholesterol level    Obesity    Past Surgical History:  Past Surgical History:  Procedure Laterality Date   CESAREAN SECTION     x3   COLONOSCOPY  2018   DILITATION & CURRETTAGE/HYSTROSCOPY WITH NOVASURE ABLATION N/A 07/03/2018   Procedure: DILATATION & CURETTAGE/HYSTEROSCOPY WITH Possible NOVASURE ABLATION;  Surgeon: Brien Few, MD;  Location: [place];  Service: Gynecology;  Laterality: N/A;   LAPAROSCOPIC GASTRIC RESTRICTIVE DUODENAL PROCEDURE (DUODENAL SWITCH) N/A 07/11/2019   tissue removed from stomach  as child   TYMPANOSTOMY TUBE PLACEMENT  as child   WRIST SURGERY Right 03/29/2019   Social History:  reports that she has never smoked. She has never  used smokeless tobacco. She reports that she does not drink alcohol and does not use drugs. Family History:  Family History  Problem Relation Age of Onset   Ovarian cancer Mother 24       deceased age 61 stage IV ovarian cancer   Breast cancer Maternal Aunt 48   Breast cancer Paternal Grandmother 37   Breast cancer Paternal Aunt 90   Stroke Brother    Diabetes Mellitus I Brother      HOME MEDICATIONS: Allergies as of 01/21/2021       Reactions   Amoxicillin-pot Clavulanate Itching   Did it involve swelling of the face/tongue/throat, SOB, or low BP? No Did it involve sudden or severe rash/hives, skin peeling, or any reaction on the inside of your mouth or nose? No Did you need to seek medical attention at a hospital or doctor's office? No When did it last happen?  A long time ago     If all above answers are NO, may proceed with cephalosporin use.   Vicodin [hydrocodone-acetaminophen] Nausea Only        Medication List        Accurate as of January 21, 2021  8:22 AM. If you have any questions, ask your nurse or doctor.          Dexcom G6 Sensor Misc Inject 1 Device into the skin as directed. Apply one sensor once every 10 days.   Dexcom G6 Transmitter Misc   IBU 600 MG tablet Generic drug: ibuprofen Take by mouth.   insulin aspart 100 UNIT/ML injection Commonly known as: novoLOG Inject into the skin.   Insulin Pen Needle 32G X 4 MM Misc 1 Device by Does not apply route in the morning, at noon, in the evening, and at bedtime.   UltiCare Mini Pen Needles 31G X 6 MM Misc Generic drug: Insulin Pen Needle USE AS DIRECTED FOUR TIMES DAILY   Lancets Misc Use as instructed   Lantus SoloStar 100 UNIT/ML Solostar Pen Generic drug: insulin glargine Inject 11 Units into the skin daily.   lidocaine 5 % Commonly known as: Lidoderm Place 1 patch onto the skin daily. Remove & Discard patch within 12 hours or as directed by MD  lisinopril 10 MG tablet Commonly  known as: ZESTRIL Take 1 tablet (10 mg total) by mouth daily.   Omnipod 5 G6 Intro (Gen 5) Kit   Omnipod 5 G6 Pod (Gen 5) Misc Inject into the skin.   ondansetron 8 MG tablet Commonly known as: Zofran Take 1 tablet (8 mg total) by mouth every 8 (eight) hours as needed for nausea or vomiting.   OneTouch Verio Flex System w/Device Kit See admin instructions.   OneTouch Verio test strip Generic drug: glucose blood Use as instructed   oxyCODONE-acetaminophen 7.5-325 MG tablet Commonly known as: Percocet Take 1 tablet by mouth every 4 (four) hours as needed for severe pain.   pantoprazole 40 MG tablet Commonly known as: PROTONIX Take 1 tablet by mouth daily.         OBJECTIVE:   Vital Signs: BP 120/80 (BP Location: Left Arm, Lori Carr Position: Sitting, Cuff Size: Small)    Pulse 77    Ht '5\' 1"'  (1.549 m)    Wt 109 lb (49.4 kg)    SpO2 99%    BMI 20.60 kg/m   Wt Readings from Last 3 Encounters:  01/21/21 109 lb (49.4 kg)  11/10/20 112 lb (50.8 kg)  08/17/20 117 lb (53.1 kg)     Exam: General: Pt appears well and is in NAD  Lungs: Clear with good BS bilat with no rales, rhonchi, or wheezes  Heart: RRR   Abdomen: Normoactive bowel sounds, soft, nontender, incisions clean  Extremities: NO edema  pretibial edema. .  Neuro: MS is good with appropriate affect, pt is alert and Ox3    DM foot exam: 07/23/2019   The skin of the feet is intact without sores or ulcerations. The pedal pulses are 2+ on right and 2+ on left. The sensation is intact to a screening 5.07, 10 gram monofilament bilaterally   DATA REVIEWED:  Lab Results  Component Value Date   HGBA1C 8.8 (H) 05/11/2020   HGBA1C 8.0 (A) 04/09/2020   HGBA1C 8.3 (H) 01/01/2020   Lab Results  Component Value Date   LDLCALC 73 05/11/2020   CREATININE 0.81 05/11/2020    Lab Results  Component Value Date   CHOL 137 05/11/2020   HDL 52 05/11/2020   LDLCALC 73 05/11/2020   TRIG 55 05/11/2020   CHOLHDL 2.6  05/11/2020         ASSESSMENT / PLAN / RECOMMENDATIONS:   1) Type 1 Diabetes Mellitus, Suboptimally  controlled, Without complications - Most recent A1c of 7.9%. Goal A1c < 7.0 %.  -A1c has trended down from 8.8% -She enters on average 27 g of carbs per day ?  I am going to adjust her insulin to carb ratio as below due to persistent hyperglycemia.  I had to use her CGM data in addition to pump and try and correlate her readings -In review of pump download there were no glucose data recorded, I have sent a message to our CDE to see why her pump is not capturing glucose data as I think this may be the reason for persistent hyperglycemia due to the possibility of the Lori Carr missing out on correction boluses -Lori Carr is not consistent with entering CHO intake -GAD-65 was elevated at  137 IU/mL    MEDICATIONS: -NovoLog   Pump   Omnipod 5 Settings   Insulin type   Novolog    Basal rate       0000 0.35 u/h  I:C ratio       0000 1:18                  Sensitivity       0000  60      Goal       0000  70-110           EDUCATION / INSTRUCTIONS: BG monitoring instructions: Lori Carr is instructed to check her blood sugars 4 times a day, before meals and bedtime. Call [place] [facility] clinic if: BG persistently < 70  I reviewed the Rule of 15 for the treatment of hypoglycemia in detail with the Lori Carr. Literature supplied.     F/U in 6 months    Signed electronically by: Mack Guise, MD  [facility] Endocrinology  [facility] [address] [place], [place] [address] Phone: [phone] FAX: [phone]   CC: [address] [place] [address] Phone: [phone]  Fax: [phone]  Return to Endocrinology clinic as below: No future appointments.

## 2021-01-21 ENCOUNTER — Encounter: Payer: Self-pay | Admitting: Internal Medicine

## 2021-01-21 ENCOUNTER — Ambulatory Visit (INDEPENDENT_AMBULATORY_CARE_PROVIDER_SITE_OTHER): Payer: 59 | Admitting: Internal Medicine

## 2021-01-21 ENCOUNTER — Other Ambulatory Visit: Payer: Self-pay

## 2021-01-21 ENCOUNTER — Telehealth: Payer: Self-pay | Admitting: Internal Medicine

## 2021-01-21 VITALS — BP 120/80 | HR 77 | Ht 61.0 in | Wt 109.0 lb

## 2021-01-21 DIAGNOSIS — E1065 Type 1 diabetes mellitus with hyperglycemia: Secondary | ICD-10-CM | POA: Diagnosis not present

## 2021-01-21 LAB — POCT GLYCOSYLATED HEMOGLOBIN (HGB A1C): Hemoglobin A1C: 7.9 % — AB (ref 4.0–5.6)

## 2021-01-21 NOTE — Telephone Encounter (Signed)
Hi Linda,    Can you please check with this pt on her Omnipod 5  ? Somehow the dexcom and pump are not linking and there's no glucose data  on the pump down and  it appears the pt is missing correction opportunities     Thanks

## 2021-01-26 NOTE — Telephone Encounter (Signed)
Patient received a new PDM and could not remember the user name and password to her [employer] account to link her PDM to them, and to [facility].  She reports that she has put in her pump settings, but has not linked the account as above.  She was told to call the help number to help her with this, and if she had any difficulty with this, she can see me and we call from here.

## 2021-02-04 ENCOUNTER — Telehealth: Payer: Self-pay | Admitting: Internal Medicine

## 2021-02-04 NOTE — Telephone Encounter (Signed)
Patient called because she needs her username and password for Omnipod - She advised she had gotten them from [facility], but can not find them at this time.  She was told to call the help number to help her with this, but when she called she was told she has to have the [place] for them to help her.  Patient wanted to leave [facility] a VM (so she was transferred) and she also wanted to call the nutrition office (number provided)  I did try to get information from clinical staff and from Antonieta Iba but unfortunately no one had the information.

## 2021-02-04 NOTE — Telephone Encounter (Signed)
Returned patient call.  Spoke with patient 12/2020 and provided user name and password but patient has lost that informaion.  She still needs to set up her new Omnipod 5 PDM as her current Omnipod 5 is cracked and is not working properly.  Instructed her to try her first initial and last name for the username and [email or web address]! For the password.  Patient is to call for further questions.  Antonieta Iba, RD, LDN, CDCES

## 2021-02-05 ENCOUNTER — Other Ambulatory Visit: Payer: Self-pay

## 2021-02-05 ENCOUNTER — Encounter: Payer: Self-pay | Admitting: Plastic Surgery

## 2021-02-05 ENCOUNTER — Ambulatory Visit (INDEPENDENT_AMBULATORY_CARE_PROVIDER_SITE_OTHER): Payer: 59 | Admitting: Plastic Surgery

## 2021-02-05 VITALS — BP 150/90 | HR 72 | Ht 61.0 in | Wt 112.6 lb

## 2021-02-05 DIAGNOSIS — Z719 Counseling, unspecified: Secondary | ICD-10-CM | POA: Diagnosis not present

## 2021-02-05 DIAGNOSIS — E1065 Type 1 diabetes mellitus with hyperglycemia: Secondary | ICD-10-CM

## 2021-02-05 NOTE — Progress Notes (Signed)
Patient ID: Lori Carr, female    DOB: February 08, 1965, 56 y.o.   MRN: [id number]   Chief Complaint  Patient presents with   Breast Problem    The patient is a 56 year old female here for evaluation of her breasts.  She is 5 feet 1 inch tall and weighs 110 pounds.  She underwent gastric bypass by Dr. Darnell Level at [facility] in June 2021.  She was 227 pounds.  Her weight has been stable for 3 to 6 months.  She has a family history of breast cancer and a paternal aunt and a grandmother.  She does have diabetes and is on an insulin pump.  Her last hemoglobin A1c was 7.9 which was an improvement from 8.9.  She is not a smoker and not on any blood thinners.  Her last mammogram was in 2022 and was negative.  Her preoperative bra size is a 32 A.  She would like to be around a B to C cup.  She has little to no breast volume with some ptosis of her breasts.  No lumps or bumps are noted   Review of Systems  Constitutional: Negative.   Eyes: Negative.   Respiratory: Negative.    Cardiovascular: Negative.   Gastrointestinal: Negative.   Endocrine: Negative.   Genitourinary: Negative.   Musculoskeletal: Negative.   Skin: Negative.   Neurological: Negative.   Hematological: Negative.    Past Medical History:  Diagnosis Date   chronic vaginal bleeding    Diabetes mellitus    type 2   DKA (diabetic ketoacidoses)    History of Elevated LDL cholesterol level    Obesity     Past Surgical History:  Procedure Laterality Date   CESAREAN SECTION     x3   COLONOSCOPY  2018   DILITATION & CURRETTAGE/HYSTROSCOPY WITH NOVASURE ABLATION N/A 07/03/2018   Procedure: DILATATION & CURETTAGE/HYSTEROSCOPY WITH Possible NOVASURE ABLATION;  Surgeon: Brien Few, MD;  Location: [facility];  Service: Gynecology;  Laterality: N/A;   LAPAROSCOPIC GASTRIC RESTRICTIVE DUODENAL PROCEDURE (DUODENAL SWITCH) N/A 07/11/2019   tissue removed from stomach  as child   TYMPANOSTOMY TUBE PLACEMENT  as child    WRIST SURGERY Right 03/29/2019      Current Outpatient Medications:    Blood Glucose Monitoring Suppl ([place]) w/Device KIT, See admin instructions., Disp: , Rfl:    Continuous Blood Gluc Sensor (DEXCOM G6 SENSOR) MISC, Inject 1 Device into the skin as directed. Apply one sensor once every 10 days., Disp: 9 each, Rfl: 3   Continuous Blood Gluc Transmit (DEXCOM G6 TRANSMITTER) MISC, , Disp: , Rfl:    glucose blood (ONETOUCH VERIO) test strip, Use as instructed, Disp: 400 each, Rfl: 3   IBU 600 MG tablet, Take by mouth., Disp: , Rfl:    insulin aspart (NOVOLOG) 100 UNIT/ML injection, Inject into the skin., Disp: , Rfl:    Insulin Disposable Pump (OMNIPOD 5 G6 INTRO, GEN 5,) KIT, , Disp: , Rfl:    Insulin Disposable Pump (OMNIPOD 5 G6 POD, GEN 5,) MISC, Inject into the skin., Disp: , Rfl:    insulin glargine (LANTUS SOLOSTAR) 100 UNIT/ML Solostar Pen, Inject 11 Units into the skin daily. (Patient not taking: Reported on 01/21/2021), Disp: 15 mL, Rfl: 11   Insulin Pen Needle 32G X 4 MM MISC, 1 Device by Does not apply route in the morning, at noon, in the evening, and at bedtime., Disp: 150 each, Rfl: 6   Lancets MISC,  Use as instructed, Disp: 400 each, Rfl: 3   lidocaine (LIDODERM) 5 %, Place 1 patch onto the skin daily. Remove & Discard patch within 12 hours or as directed by MD, Disp: 30 patch, Rfl: 0   lisinopril (ZESTRIL) 10 MG tablet, Take 1 tablet (10 mg total) by mouth daily., Disp: 90 tablet, Rfl: 3   ondansetron (ZOFRAN) 8 MG tablet, Take 1 tablet (8 mg total) by mouth every 8 (eight) hours as needed for nausea or vomiting. (Patient not taking: Reported on 01/21/2021), Disp: 20 tablet, Rfl: 0   oxyCODONE-acetaminophen (PERCOCET) 7.5-325 MG tablet, Take 1 tablet by mouth every 4 (four) hours as needed for severe pain. (Patient not taking: Reported on 01/21/2021), Disp: 20 tablet, Rfl: 0   pantoprazole (PROTONIX) 40 MG tablet, Take 1 tablet by mouth daily., Disp: , Rfl:     ULTICARE MINI PEN NEEDLES 31G X 6 MM MISC, USE AS DIRECTED FOUR TIMES DAILY, Disp: 100 each, Rfl: 5   Objective:   There were no vitals filed for this visit.  Physical Exam Vitals reviewed.  Constitutional:      Appearance: Normal appearance.  Cardiovascular:     Rate and Rhythm: Normal rate.     Pulses: Normal pulses.  Pulmonary:     Effort: Pulmonary effort is normal. No respiratory distress.  Abdominal:     General: There is no distension.     Palpations: Abdomen is soft.     Tenderness: There is no abdominal tenderness.  Musculoskeletal:        General: No swelling or deformity.  Skin:    General: Skin is warm.     Capillary Refill: Capillary refill takes less than 2 seconds.     Coloration: Skin is not jaundiced.     Findings: No bruising.  Neurological:     Mental Status: She is alert and oriented to person, place, and time.  Psychiatric:        Mood and Affect: Mood normal.        Behavior: Behavior normal.        Thought Content: Thought content normal.    Assessment & Plan:  Type 1 diabetes mellitus with hyperglycemia ([place])  Encounter for counseling  The patient is a candidate for breast augmentation.  I think that she is very realistic.  I would like her to come in and try our sizers.  I am concerned about her insulin dependent diabetic status.  I want to be sure she understands the gravity if there is a problem.  She is interested in silicone implants under the muscle with an inframammary approach.  I would definitely want to talk to her again prior to scheduling any surgery.  Pictures were obtained of the patient and placed in the chart with the patient's or guardian's permission.     [place], DO

## 2021-02-09 DIAGNOSIS — Z9884 Bariatric surgery status: Secondary | ICD-10-CM | POA: Diagnosis not present

## 2021-02-09 DIAGNOSIS — E109 Type 1 diabetes mellitus without complications: Secondary | ICD-10-CM | POA: Diagnosis not present

## 2021-02-09 DIAGNOSIS — R112 Nausea with vomiting, unspecified: Secondary | ICD-10-CM | POA: Diagnosis not present

## 2021-02-09 NOTE — Telephone Encounter (Signed)
Patient reports that she called the help line and they helped her link her new PDM.

## 2021-02-12 DIAGNOSIS — Z9884 Bariatric surgery status: Secondary | ICD-10-CM | POA: Diagnosis not present

## 2021-02-12 DIAGNOSIS — E109 Type 1 diabetes mellitus without complications: Secondary | ICD-10-CM | POA: Diagnosis not present

## 2021-02-12 DIAGNOSIS — K909 Intestinal malabsorption, unspecified: Secondary | ICD-10-CM | POA: Diagnosis not present

## 2021-02-12 DIAGNOSIS — Z6820 Body mass index (BMI) 20.0-20.9, adult: Secondary | ICD-10-CM | POA: Diagnosis not present

## 2021-02-12 DIAGNOSIS — R112 Nausea with vomiting, unspecified: Secondary | ICD-10-CM | POA: Diagnosis not present

## 2021-02-16 ENCOUNTER — Ambulatory Visit: Payer: 59 | Admitting: Surgical

## 2021-02-18 ENCOUNTER — Other Ambulatory Visit: Payer: Self-pay

## 2021-02-18 MED ORDER — DEXCOM G6 SENSOR MISC: 1.0000 | 3 refills | Status: DC

## 2021-02-18 NOTE — Telephone Encounter (Signed)
Dexcom sensors sent to pharmacy

## 2021-02-22 ENCOUNTER — Encounter: Payer: Self-pay | Admitting: Internal Medicine

## 2021-02-23 ENCOUNTER — Ambulatory Visit: Payer: Self-pay | Admitting: Physician Assistant

## 2021-02-23 ENCOUNTER — Other Ambulatory Visit: Payer: Self-pay

## 2021-02-23 ENCOUNTER — Encounter: Payer: Self-pay | Admitting: Physician Assistant

## 2021-02-23 DIAGNOSIS — L239 Allergic contact dermatitis, unspecified cause: Secondary | ICD-10-CM

## 2021-02-23 MED ORDER — METHYLPREDNISOLONE 4 MG PO TBPK: ORAL_TABLET | ORAL | 0 refills | Status: DC

## 2021-02-23 MED ORDER — HYDROXYZINE PAMOATE 25 MG PO CAPS: 25.0000 mg | ORAL_CAPSULE | Freq: Three times a day (TID) | ORAL | 0 refills | Status: DC | PRN

## 2021-02-23 NOTE — Progress Notes (Signed)
° °  Subjective:Rash    Patient ID: Lori Carr, female    DOB: December 10, 1965, 56 y.o.   MRN: [id number]  HPI Patient presents with facial rash, itching.  Patient states she is unaware of any provocative incident for complaint.  Denies vision disturbance.  Patient states she feels a tightness inferior aspect of left orbital area.   Review of Systems Diabetes GERD, and hyperlipidemia.    Objective:   Physical Exam  No acute distress.  Temperature 97.3, pulse is 60, BP is 149/90, and patient weighs 114 pounds. Patient has mild edema erythema and macular patches inferior aspect of bilateral eyes.      Assessment & Plan: Contact dermatitis.   Patient given a prescription for Medrol Dosepak and Atarax.  Patient advised to monitor glucose level during the duration of taking steroids with her Dexcom monitor.  Follow-up in 24 hours.

## 2021-02-24 ENCOUNTER — Ambulatory Visit: Payer: Self-pay | Admitting: Physician Assistant

## 2021-02-24 ENCOUNTER — Encounter: Payer: Self-pay | Admitting: Physician Assistant

## 2021-02-24 VITALS — BP 138/88 | HR 73 | Temp 97.8°F | Resp 14 | Ht 60.0 in | Wt 110.3 lb

## 2021-02-24 DIAGNOSIS — R21 Rash and other nonspecific skin eruption: Secondary | ICD-10-CM

## 2021-02-24 MED ORDER — TRIAMCINOLONE ACETONIDE 40 MG/ML IJ SUSP
40.0000 mg | Freq: Once | INTRAMUSCULAR | Status: AC
  Administered 2021-02-24: 15:00:00 40 mg via INTRAMUSCULAR

## 2021-02-24 NOTE — Progress Notes (Signed)
° °  Subjective: Facial rash    Patient ID: Lori Carr, female    DOB: 1965-04-07, 56 y.o.   MRN: [id number]  HPI Patient returns for reevaluation facial rash which started yesterday.  Patient had no no improvement of the rash but the itching has decreased.  Except for decreased itching.  Patient is currently taking the Medrol Dosepak and Atarax.  Patient again denies any provocative incident for complaint.   Review of Systems Type 1 diabetes    Objective:   Physical Exam No acute distress.  Temperature 97.8, pulse 73, respiration 14, BP is 138/88, patient 99% O2 sat on room air.  Patient weighs 110 pounds. Examination of facial area shows edema and erythema bilateral inferior orbital area.       Assessment & Plan: Facial rash.   Advised continue Medrol Dosepak and Atarax.  Follow-up in 24 hours.

## 2021-02-24 NOTE — Progress Notes (Signed)
Pt presents today for follow up on rash.

## 2021-02-25 ENCOUNTER — Ambulatory Visit: Payer: Self-pay | Admitting: Physician Assistant

## 2021-02-25 ENCOUNTER — Encounter: Payer: Self-pay | Admitting: Physician Assistant

## 2021-02-25 ENCOUNTER — Other Ambulatory Visit: Payer: Self-pay

## 2021-02-25 VITALS — BP 157/87 | HR 67 | Temp 97.6°F | Resp 12 | Ht 61.0 in | Wt 110.0 lb

## 2021-02-25 DIAGNOSIS — R21 Rash and other nonspecific skin eruption: Secondary | ICD-10-CM

## 2021-02-25 NOTE — Progress Notes (Signed)
Presents today for f\u rash on face./CL,RMA

## 2021-02-25 NOTE — Progress Notes (Signed)
° °  Subjective: Facial rash    Patient ID: Lori Carr, female    DOB: 04-21-1965, 56 y.o.   MRN: [id number]  HPI Patient follow-up for 3 days status post facial rash secondary to suspected contact dermatitis.  Patient stated swelling and itching has resolved.  Patient is concerned secondary to the ecchymosis which developed 2 days ago.  Denies pain or loss of sensation.   Review of Systems Diabetes, anxiety and diabetes.    Objective:   Physical Exam No acute distress.  Temperature 97.6, pulse 67, respiration 12, BP is 137/87, and patient 90% O2 sat on room air.  Patient weighs 110 pounds. Examination facial area shows resolving ecchymosis secondary to edema.       Assessment & Plan: Contact dermatitis   Advised patient continue antihistamines and finish the Medrol Dosepak.  Follow-up in 5 days.

## 2021-02-26 DIAGNOSIS — R233 Spontaneous ecchymoses: Secondary | ICD-10-CM | POA: Diagnosis not present

## 2021-03-01 DIAGNOSIS — R233 Spontaneous ecchymoses: Secondary | ICD-10-CM | POA: Diagnosis not present

## 2021-03-03 DIAGNOSIS — E109 Type 1 diabetes mellitus without complications: Secondary | ICD-10-CM | POA: Diagnosis not present

## 2021-03-03 DIAGNOSIS — E560 Deficiency of vitamin E: Secondary | ICD-10-CM | POA: Diagnosis not present

## 2021-03-03 DIAGNOSIS — E559 Vitamin D deficiency, unspecified: Secondary | ICD-10-CM | POA: Diagnosis not present

## 2021-03-03 DIAGNOSIS — Z9884 Bariatric surgery status: Secondary | ICD-10-CM | POA: Diagnosis not present

## 2021-03-03 DIAGNOSIS — E778 Other disorders of glycoprotein metabolism: Secondary | ICD-10-CM | POA: Diagnosis not present

## 2021-03-04 DIAGNOSIS — E560 Deficiency of vitamin E: Secondary | ICD-10-CM | POA: Insufficient documentation

## 2021-03-10 ENCOUNTER — Telehealth: Payer: Self-pay | Admitting: Plastic Surgery

## 2021-03-10 NOTE — Telephone Encounter (Signed)
Returned patient's call regarding insurance. Advised that breast augmentation is considered cosmetic so insurance would not cover implants being placed if it's not a part of breast reconstruction due to cancer. Patient asked when surgery fees would be due so I explained that the provider fee would be due by the pre op which is scheduled 2-3 weeks in advance. OR and Anesthesia fees are collected about a week in advance from surgery center. Patient is unable to do that at this time and will call us when she is ready. Advised if she decides to move forward we can schedule her for a follow up visit at that time to reevaluate.

## 2021-03-19 ENCOUNTER — Encounter: Payer: Self-pay | Admitting: Internal Medicine

## 2021-03-23 ENCOUNTER — Other Ambulatory Visit: Payer: Self-pay | Admitting: Internal Medicine

## 2021-03-29 ENCOUNTER — Other Ambulatory Visit: Payer: Self-pay

## 2021-03-29 MED ORDER — INSULIN ASPART 100 UNIT/ML IJ SOLN: INTRAMUSCULAR | 3 refills | Status: DC

## 2021-04-07 ENCOUNTER — Other Ambulatory Visit: Payer: Self-pay | Admitting: Internal Medicine

## 2021-04-07 ENCOUNTER — Telehealth: Payer: Self-pay

## 2021-04-07 MED ORDER — INSULIN ASPART 100 UNIT/ML IJ SOLN: INTRAMUSCULAR | 6 refills | Status: DC

## 2021-04-07 NOTE — Telephone Encounter (Signed)
Patient states that the cost of her Novolog has changed from 45 dollars for 2 vials to 130 dollar.  Patient states that max needs to be changed so she can get the most for her money. Patient can't afford the 130 dollars for the medication and would like script to be changed back to where she can get it for 45 dollars for 2 vials.  ?

## 2021-04-07 NOTE — Telephone Encounter (Signed)
Patient will contact [facility] and let us know if she needs anything else.  ?

## 2021-04-08 ENCOUNTER — Ambulatory Visit: Payer: Self-pay | Admitting: Physician Assistant

## 2021-04-08 ENCOUNTER — Encounter: Payer: Self-pay | Admitting: Physician Assistant

## 2021-04-08 ENCOUNTER — Other Ambulatory Visit: Payer: Self-pay

## 2021-04-08 VITALS — Temp 97.6°F

## 2021-04-08 DIAGNOSIS — E0800 Diabetes mellitus due to underlying condition with hyperosmolarity without nonketotic hyperglycemic-hyperosmolar coma (NKHHC): Secondary | ICD-10-CM

## 2021-04-08 NOTE — Progress Notes (Signed)
Pt has rx question concerning insulin.  ?

## 2021-04-08 NOTE — Progress Notes (Signed)
? ?  Subjective: Diabetes  ? ? Patient ID: Lori Carr, female    DOB: [birth year], 56 y.o.   MRN: [id number] ? ?HPI ?Patient here today to discuss filling prescription for NovoLog.  Patient stated in the past she was able to get 2 vials of NovoLog.  Patient stated endocrinologist wrote a prescription for her to get 2 vials but it was denied by the pharmacy.  She requests this clinic to rewrite the prescription for 2 vials.  Review of patient record shows in the past before her bariatric surgery patient was using up to 70 units a day.  After surgery in the obtainment of a insulin pump she only uses 20 units a day. ? ? ?Review of Systems ?Anxiety and diabetes. ?   ?Objective:  ? Physical Exam ? ?Deferred ? ? ?   ?Assessment & Plan: Type 1 diabetes  ?Explained to patient rationale for decrease of the amount of vials of NovoLog she can obtain at 1 time.  Patient states she has enough insulin at this time we will follow-up as needed.  Also discussed rationale for her adjustments must be done by endocrinologist. ? ?

## 2021-04-14 ENCOUNTER — Encounter: Payer: 59 | Admitting: Physician Assistant

## 2021-04-24 IMAGING — MG DIGITAL SCREENING BILATERAL MAMMOGRAM WITH TOMO AND CAD
8 series · 8 of 24 positions shown · non-contrast
Comparison: Previous exam(s).

CLINICAL DATA: Screening.

EXAM:
DIGITAL SCREENING BILATERAL MAMMOGRAM WITH TOMO AND CAD

[R MLO synth-2D]
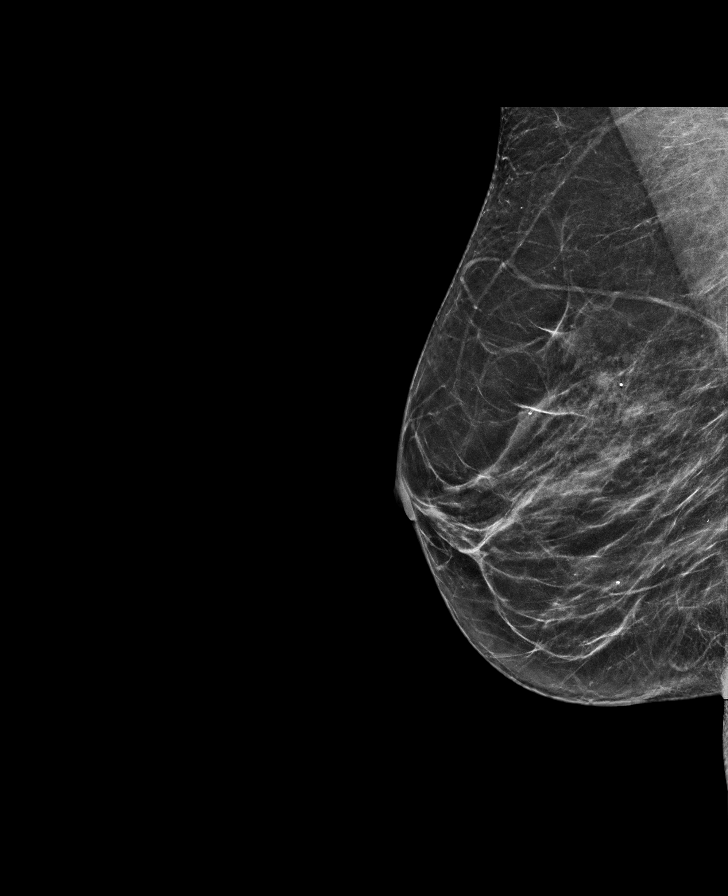

[L CC synth-2D]
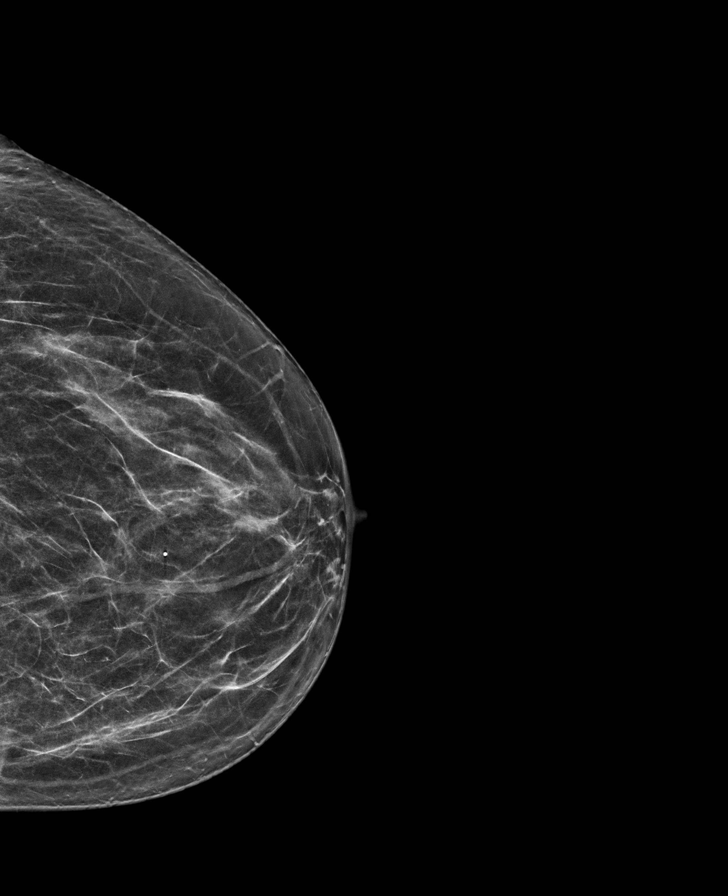

[R CC synth-2D]
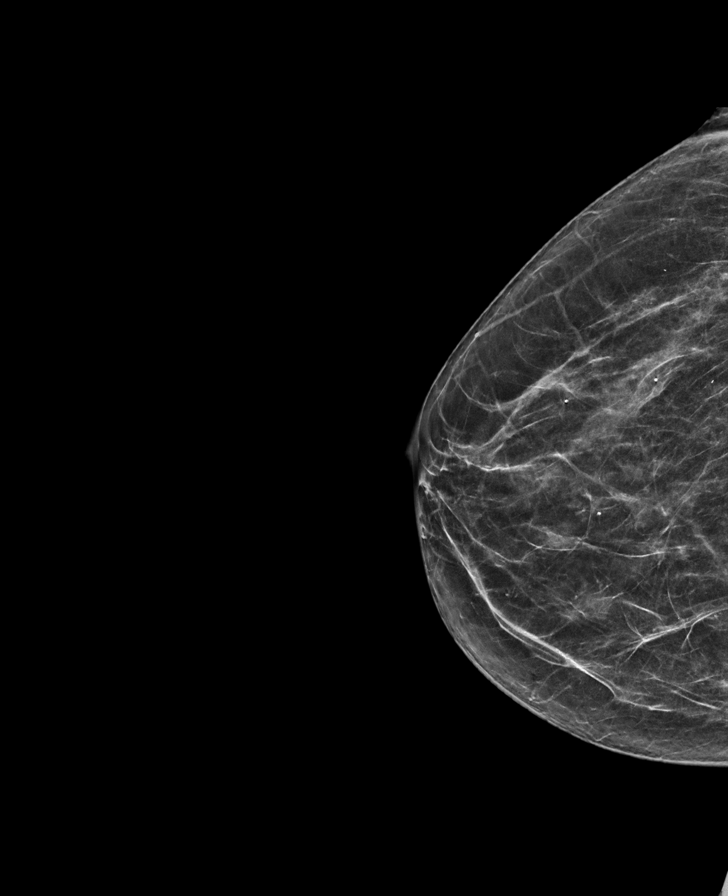

[L MLO synth-2D]
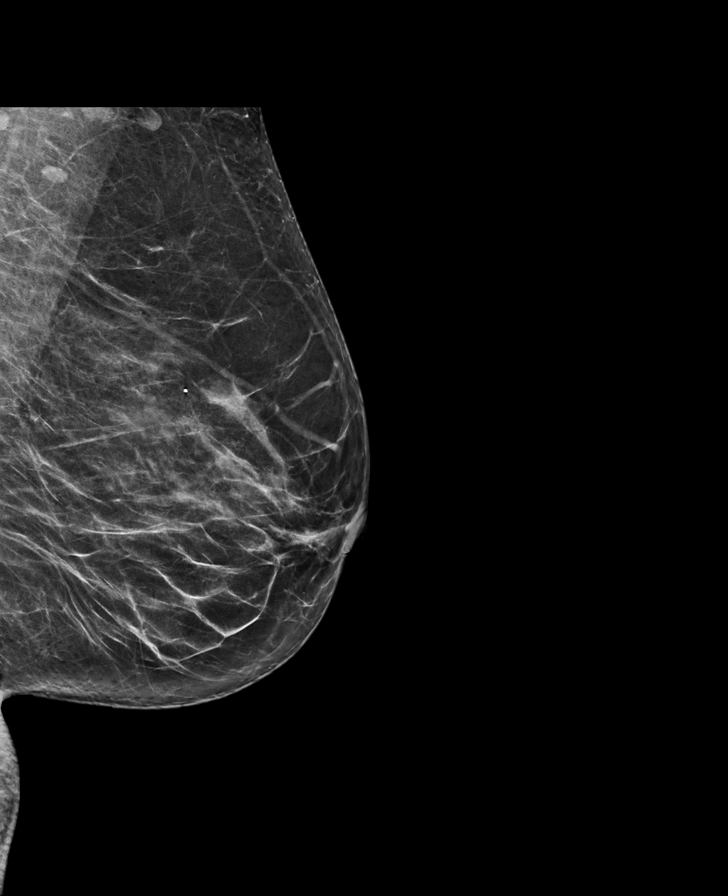

[L MLO tomo · tomo slice 35/68.0]
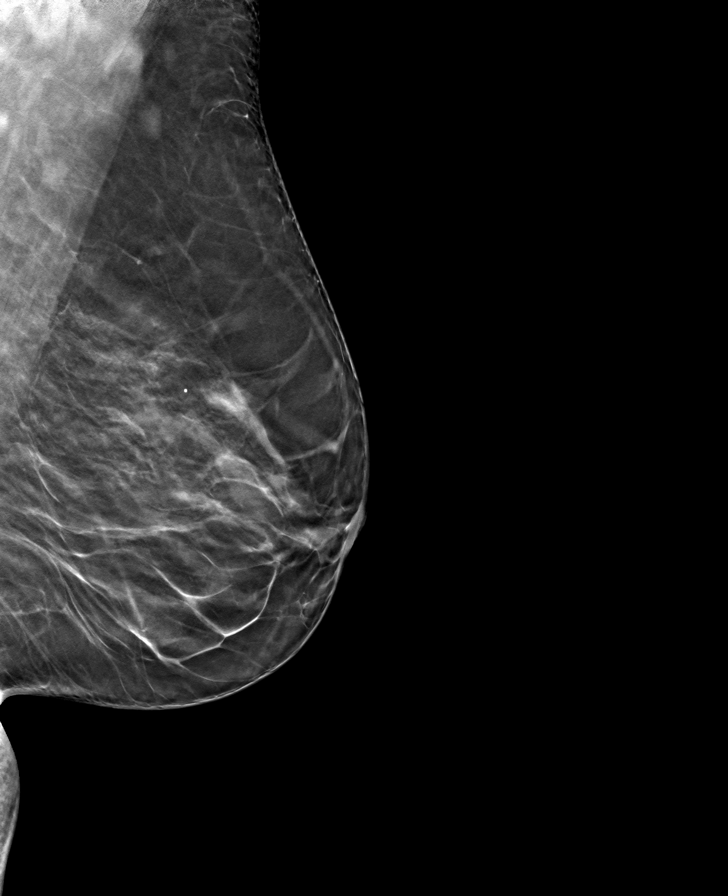

[R CC tomo · tomo slice 31/62.0]
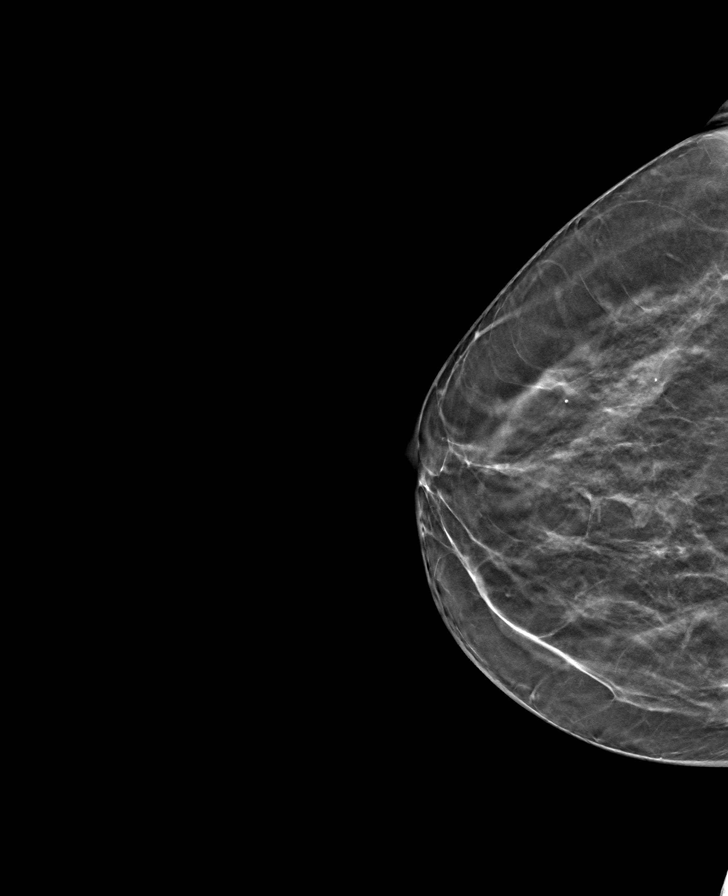

[R MLO tomo · tomo slice 33/66.0]
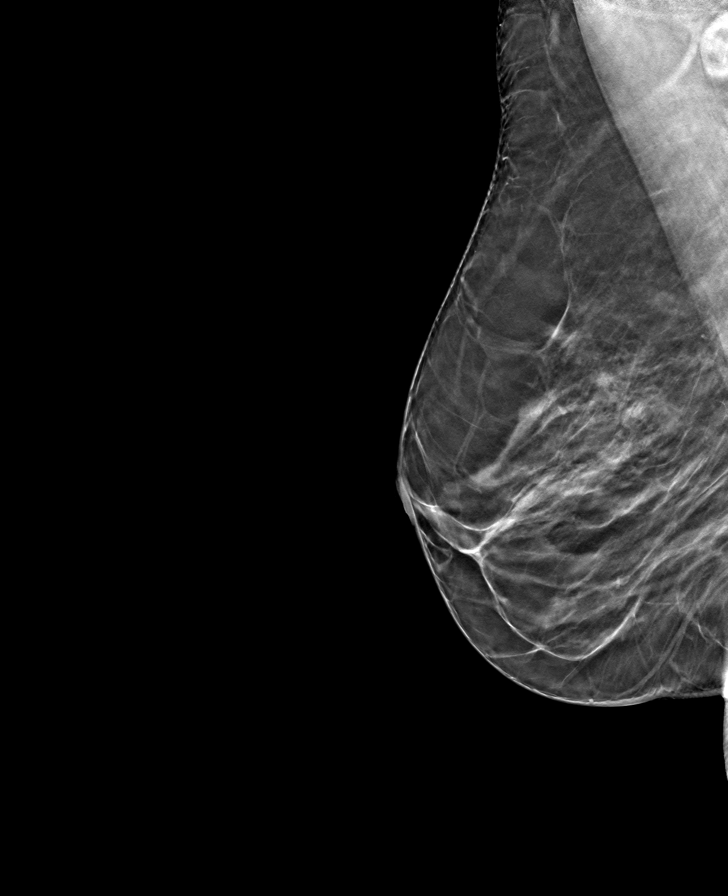

[L CC tomo · tomo slice 32/63.0]
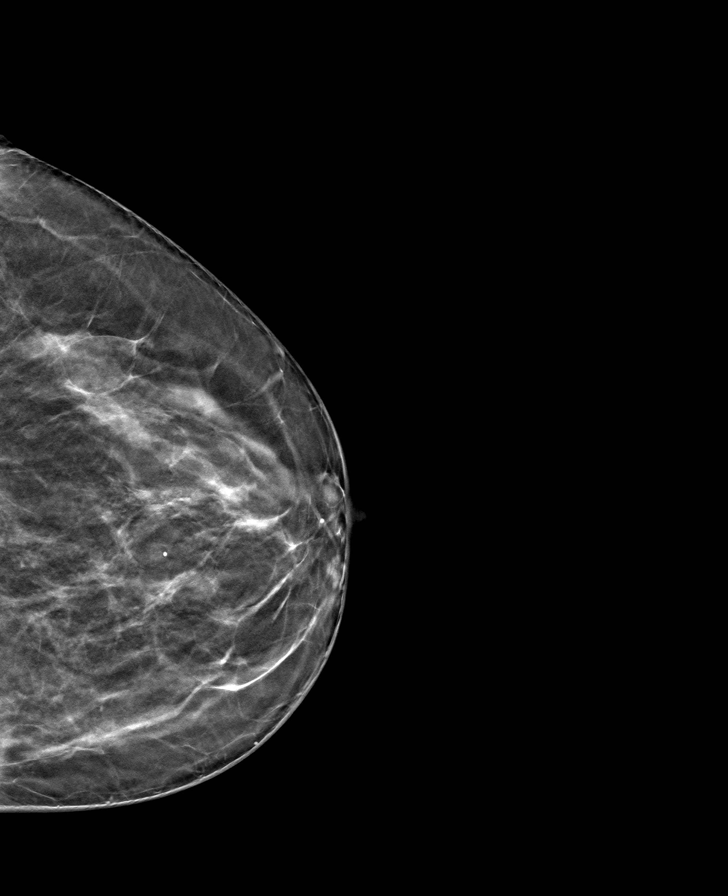

[8 of 24 positions shown; findings below may reference images not displayed]

ACR Breast Density Category c: The breast tissue is heterogeneously
dense, which may obscure small masses.
FINDINGS: There are no findings suspicious for malignancy. Images were
processed with CAD.
IMPRESSION: No mammographic evidence of malignancy. A result letter of this
screening mammogram will be mailed directly to the patient.

RECOMMENDATION:
Screening mammogram in one year. (Code:[id number])

BI-RADS CATEGORY  1: Negative.

## 2021-05-17 ENCOUNTER — Emergency Department: Payer: 59

## 2021-05-17 ENCOUNTER — Encounter: Payer: Self-pay | Admitting: Intensive Care

## 2021-05-17 ENCOUNTER — Emergency Department
Admission: EM | Admit: 2021-05-17 | Discharge: 2021-05-17 | Disposition: A | Discharge status: Home / Self Care | Payer: 59 | Attending: Emergency Medicine | Admitting: Emergency Medicine

## 2021-05-17 ENCOUNTER — Other Ambulatory Visit: Payer: Self-pay

## 2021-05-17 DIAGNOSIS — E860 Dehydration: Secondary | ICD-10-CM

## 2021-05-17 DIAGNOSIS — W1830XA Fall on same level, unspecified, initial encounter: Secondary | ICD-10-CM | POA: Insufficient documentation

## 2021-05-17 DIAGNOSIS — E1165 Type 2 diabetes mellitus with hyperglycemia: Secondary | ICD-10-CM | POA: Diagnosis not present

## 2021-05-17 DIAGNOSIS — R739 Hyperglycemia, unspecified: Secondary | ICD-10-CM

## 2021-05-17 DIAGNOSIS — L02415 Cutaneous abscess of right lower limb: Secondary | ICD-10-CM | POA: Diagnosis present

## 2021-05-17 DIAGNOSIS — L0291 Cutaneous abscess, unspecified: Secondary | ICD-10-CM

## 2021-05-17 DIAGNOSIS — I10 Essential (primary) hypertension: Secondary | ICD-10-CM | POA: Diagnosis not present

## 2021-05-17 DIAGNOSIS — R55 Syncope and collapse: Secondary | ICD-10-CM | POA: Diagnosis not present

## 2021-05-17 LAB — BASIC METABOLIC PANEL
Anion gap: 14 (ref 5–15)
Anion gap: 10 (ref 5–15)
BUN: 16 mg/dL (ref 6–20)
BUN: 15 mg/dL (ref 6–20)
CO2: 20 mmol/L — ABNORMAL LOW (ref 22–32)
CO2: 21 mmol/L — ABNORMAL LOW (ref 22–32)
Calcium: 8.7 mg/dL — ABNORMAL LOW (ref 8.9–10.3)
Calcium: 8.1 mg/dL — ABNORMAL LOW (ref 8.9–10.3)
Chloride: 97 mmol/L — ABNORMAL LOW (ref 98–111)
Chloride: 100 mmol/L (ref 98–111)
Creatinine, Ser: 0.89 mg/dL (ref 0.44–1.00)
Creatinine, Ser: 0.73 mg/dL (ref 0.44–1.00)
GFR, Estimated: >60 mL/min (ref >60)
GFR, Estimated: >60 mL/min (ref >60)
Glucose, Bld: 547 mg/dL (ref 70–99)
Glucose, Bld: 325 mg/dL — ABNORMAL HIGH (ref 70–99)
Potassium: 4.5 mmol/L (ref 3.5–5.1)
Potassium: 3.3 mmol/L — ABNORMAL LOW (ref 3.5–5.1)
Sodium: 131 mmol/L — ABNORMAL LOW (ref 135–145)
Sodium: 131 mmol/L — ABNORMAL LOW (ref 135–145)

## 2021-05-17 LAB — URINALYSIS, ROUTINE W REFLEX MICROSCOPIC
Bacteria, UA: NONE SEEN
Bilirubin Urine: NEGATIVE
Glucose, UA: >=500 mg/dL — AB
Hgb urine dipstick: NEGATIVE
Ketones, ur: 80 mg/dL — AB
Leukocytes,Ua: NEGATIVE
Nitrite: NEGATIVE
Protein, ur: NEGATIVE mg/dL
Specific Gravity, Urine: 1.029 (ref 1.005–1.030)
WBC, UA: NONE SEEN WBC/hpf (ref 0–5)
pH: 5 (ref 5.0–8.0)

## 2021-05-17 LAB — BLOOD GAS, VENOUS
Acid-base deficit: 8.1 mmol/L — ABNORMAL HIGH (ref 0.0–2.0)
Bicarbonate: 17.8 mmol/L — ABNORMAL LOW (ref 20.0–28.0)
O2 Saturation: 81.9 %
Patient temperature: 37
pCO2, Ven: 37 mmHg — ABNORMAL LOW (ref 44–60)
pH, Ven: 7.29 (ref 7.25–7.43)
pO2, Ven: 49 mmHg — ABNORMAL HIGH (ref 32–45)

## 2021-05-17 LAB — CBC
HCT: 43.4 % (ref 36.0–46.0)
Hemoglobin: 14 g/dL (ref 12.0–15.0)
MCH: 28.8 pg (ref 26.0–34.0)
MCHC: 32.3 g/dL (ref 30.0–36.0)
MCV: 89.3 fL (ref 80.0–100.0)
Platelets: 273 10*3/uL (ref 150–400)
RBC: 4.86 MIL/uL (ref 3.87–5.11)
RDW: 12.6 % (ref 11.5–15.5)
WBC: 10.1 10*3/uL (ref 4.0–10.5)
nRBC: 0 % (ref 0.0–0.2)

## 2021-05-17 LAB — BETA-HYDROXYBUTYRIC ACID: Beta-Hydroxybutyric Acid: 3.58 mmol/L — ABNORMAL HIGH (ref 0.05–0.27)

## 2021-05-17 LAB — CBG MONITORING, ED
Glucose-Capillary: 498 mg/dL — ABNORMAL HIGH (ref 70–99)
Glucose-Capillary: 454 mg/dL — ABNORMAL HIGH (ref 70–99)
Glucose-Capillary: 298 mg/dL — ABNORMAL HIGH (ref 70–99)

## 2021-05-17 MED ORDER — LACTATED RINGERS IV BOLUS
1000.0000 mL | Freq: Once | INTRAVENOUS | Status: AC
Start: 2021-05-17 — End: 2021-05-17
  Administered 2021-05-17: 1000 mL via INTRAVENOUS

## 2021-05-17 MED ORDER — ACETAMINOPHEN 500 MG PO TABS
1000.0000 mg | ORAL_TABLET | Freq: Once | ORAL | Status: AC
  Administered 2021-05-17: 1000 mg via ORAL
  Filled 2021-05-17: qty 2

## 2021-05-17 MED ORDER — LIDOCAINE-EPINEPHRINE 2 %-1:100000 IJ SOLN
30.0000 mL | Freq: Once | INTRAMUSCULAR | Status: AC
  Administered 2021-05-17: 30 mL via INTRADERMAL
  Filled 2021-05-17: qty 2

## 2021-05-17 MED ORDER — VANCOMYCIN HCL IN DEXTROSE 1-5 GM/200ML-% IV SOLN
1000.0000 mg | Freq: Once | INTRAVENOUS | Status: AC
  Administered 2021-05-17: 1000 mg via INTRAVENOUS
  Filled 2021-05-17: qty 200

## 2021-05-17 MED ORDER — INSULIN ASPART 100 UNIT/ML IJ SOLN
0.0000 [IU] | INTRAMUSCULAR | Status: DC
  Administered 2021-05-17: 8 [IU] via SUBCUTANEOUS
  Administered 2021-05-17: 15 [IU] via SUBCUTANEOUS
  Filled 2021-05-17 (×2): qty 1

## 2021-05-17 MED ORDER — POTASSIUM CHLORIDE CRYS ER 20 MEQ PO TBCR
40.0000 meq | EXTENDED_RELEASE_TABLET | Freq: Once | ORAL | Status: AC
  Administered 2021-05-17: 40 meq via ORAL
  Filled 2021-05-17: qty 2

## 2021-05-17 MED ORDER — LACTATED RINGERS IV BOLUS
1000.0000 mL | Freq: Once | INTRAVENOUS | Status: AC
  Administered 2021-05-17: 1000 mL via INTRAVENOUS

## 2021-05-17 MED ORDER — DOXYCYCLINE HYCLATE 100 MG PO CAPS: 100.0000 mg | ORAL_CAPSULE | Freq: Two times a day (BID) | ORAL | 0 refills | Status: AC

## 2021-05-17 NOTE — ED Provider Notes (Signed)
Patient was signed out to me at 3 PM pending repeat BMP after fluids.  She has a diabetic with an insulin pump who presented with hyperglycemia and a syncopal episode.  Initial BMP with bicarb of 20 but no anion gap beta hydroxybutyrate slightly elevated at 3.  A VBG was sent prior to any fluids which returned with a pH of 7-9 with a bicarb of 17.  Patient was given fluids and insulin repeat BMP with a bicarb of 21 and again no anion gap.  On reassessment of the patient she appears clinically well she is eating normally.  Thinks that the insulin pump was not functioning and plans to put it back on in a different location when she goes home.  I think with a reassuring BMP she is appropriate for discharge.  Provider performing has ordered antibiotics.  I did discuss that it is very important that she monitors her glucose over the next day or 2 and if it is persistently elevated that she either return to the ED or follow-up with her endocrinologist as she is at risk for going into DKA if she is not getting her basal insulin.  Patient understood the plan. ?  ?Georga Hacking, MD ?05/17/21 1747 ? ?

## 2021-05-17 NOTE — ED Triage Notes (Signed)
Reports LOC Saturday night/early Sunday morning. HX diabetes. Fell to the ground from standing. C/o head pain and left arm pain. Reports wherever she puts her insulin pod is causing redness, swelling, pain and puss since incident  ?

## 2021-05-17 NOTE — Progress Notes (Signed)
Inpatient Diabetes Program Recommendations ? ?AACE/ADA: New Consensus Statement on Inpatient Glycemic Control (2015) ? ?Target Ranges:  Prepandial:   less than 140 mg/dL ?     Peak postprandial:   less than 180 mg/dL (1-2 hours) ?     Critically ill patients:  140 - 180 mg/dL  ? ?Lab Results  ?Component Value Date  ? GLUCAP 454 (H) 05/17/2021  ? HGBA1C 7.9 (A) 01/21/2021  ? ? ?Review of Glycemic Control ? ?Inpatient Diabetes Program Recommendations:   ?Spoke with patient @ bedside in ED. Reviewed with patient she just received Novolog 15 units insulin and to monitor her glucose levels with her sensor that she has on. Patient to take a regular soda or juice with her if driving home to have in case glucose drops quickly. Patient has daughter that can arrange to pick her up if she needs to @ times of discharge. ?Patient has insulin pump supplies @ home and insulin insertion sites have been removed. Patient has been rotating insulin pump insertion sites on her thighs and requested to review other sites to assure no scar tissue. ? ?Thank you, ?Nani Gasser Kaesyn Johnston, RN, MSN, CDE  ?Diabetes Coordinator ?Inpatient Glycemic Control Team ?Team Pager [phone] (8am-5pm) ?05/17/2021 3:11 PM ? ? ? ?

## 2021-05-17 NOTE — ED Provider Notes (Signed)
? ?[facility] ?Provider Note ? ? ? Event Date/Time  ? First MD Initiated Contact with Patient 05/17/21 1328   ?  (approximate) ? ? ?History  ? ?Loss of Consciousness ? ? ?HPI ? ?Lori Carr is a 56 y.o. female with past medical history of HDL and diabetes who presents for evaluation after a syncopal episode that occurred early yesterday morning as well as area of redness on the bilateral anterior thighs the patient states she had her insulin pump.  She is soft today.  She states she was out [place] gambling last night when she started feeling lightheaded and dizzy.  She states she think she fell while walking hitting her head and her left chest.  She is a little bit of a headache and left chest pain.  She denies being on any blood thinners.  She denies any neck pain or other back pain or any other extremity pain other than the bilateral anterior thighs where she states she has developed some redness pain and swelling on both sides where she had tried placing her insulin pump.  She states she expressed a little pus in the site on the right.  No fevers, cough, chest pain, nausea, vomiting, diarrhea, or other recent injuries or falls.  She denies tobacco abuse, EtOH use or illicit drug use. ? ?  ?Past Medical History:  ?Diagnosis Date  ? chronic vaginal bleeding   ? Diabetes mellitus   ? type 2  ? DKA (diabetic ketoacidoses)   ? History of Elevated LDL cholesterol level   ? Obesity   ? ? ? ?Physical Exam  ?Triage Vital Signs: ?ED Triage Vitals  ?Enc Vitals Group  ?   BP 05/17/21 1234 (!) 160/90  ?   Pulse Rate 05/17/21 1234 85  ?   Resp 05/17/21 1234 16  ?   Temp 05/17/21 1234 98.2 ?F (36.8 ?C)  ?   Temp Source 05/17/21 1234 Oral  ?   SpO2 05/17/21 1234 98 %  ?   Weight 05/17/21 1235 108 lb (49 kg)  ?   Height 05/17/21 1235 5\' 1"  (1.549 m)  ?   Head Circumference --   ?   Peak Flow --   ?   Pain Score 05/17/21 1234 3  ?   Pain Loc --   ?   Pain Edu? --   ?   Excl. in GC? --   ? ? ?Most  recent vital signs: ?Vitals:  ? 05/17/21 1234  ?BP: (!) 160/90  ?Pulse: 85  ?Resp: 16  ?Temp: 98.2 ?F (36.8 ?C)  ?SpO2: 98%  ? ? ?General: Awake, no distress.  ?CV:  Prolonged cap refill in the digits.  2+ radial pulse.  No murmur ?Resp:  Normal effort.  Clear bilaterally ?Abd:  No distention.  Soft. ?Other:  There is a 1 x 2 cm oval-shaped area of erythema edema fluctuance in the anterior right upper thigh as well as a slightly larger similar lesion on the anterior left upper thigh 2 x 4 cm.  No other similar areas of erythema tenderness or fluctuance. ? ?Mild tenderness of the left chest ? ? ?CN II-XII grossly intact ? ?No tenderness/deformities over the C/T/L spine ? ?No focal TTP over b/l shoulders, elbows, wrists, hips, knees, ankles ? ?2+ b/l radial and PD pulses  ? ?No other obvious trauma to face, scalp ,head, neck or torso ? ?Patient has full range of motion of her neck. ? ?ED Results /  Procedures / Treatments  ?Labs ?(all labs ordered are listed, but only abnormal results are displayed) ?Labs Reviewed  ?BASIC METABOLIC PANEL - Abnormal; Notable for the following components:  ?    Result Value  ? Sodium 131 (*)   ? Chloride 97 (*)   ? CO2 20 (*)   ? Glucose, Bld 547 (*)   ? Calcium 8.7 (*)   ? All other components within normal limits  ?URINALYSIS, ROUTINE W REFLEX MICROSCOPIC - Abnormal; Notable for the following components:  ? Color, Urine STRAW (*)   ? APPearance CLEAR (*)   ? Glucose, UA >=500 (*)   ? Ketones, ur 80 (*)   ? All other components within normal limits  ?BETA-HYDROXYBUTYRIC ACID - Abnormal; Notable for the following components:  ? Beta-Hydroxybutyric Acid 3.58 (*)   ? All other components within normal limits  ?CBG MONITORING, ED - Abnormal; Notable for the following components:  ? Glucose-Capillary 498 (*)   ? All other components within normal limits  ?CBG MONITORING, ED - Abnormal; Notable for the following components:  ? Glucose-Capillary 454 (*)   ? All other components within normal  limits  ?CBC  ?BLOOD GAS, VENOUS  ? ? ? ?EKG ? ?ECG is remarkable sinus rhythm with a ventricular rate of 84, Q-wave in V2 similar to that seen on EKG from May 5, otherwise unremarkable intervals without evidence of acute ischemia or significant arrhythmia. ? ? ?RADIOLOGY ? ?CT head on my interpretation without evidence of hemorrhage, skull fracture, edema, mass effect or any other acute intracranial process.  I also reviewed radiology interpretation and agree with the findings. ? ?Chest reviewed by myself shows no focal consoidation, effusion, edema, pneumothorax or other clear acute thoracic process. I also reviewed radiology interpretation and agree with findings described. ? ? ?PROCEDURES: ? ?Critical Care performed: No ? ?Marland Kitchen..Incision and Drainage ? ?Date/Time: 05/17/2021 2:59 PM ?Performed by: Gilles ChiquitoSmith, Kearah Gayden P, MD ?Authorized by: Gilles ChiquitoSmith, Tiearra Colwell P, MD  ? ?Consent:  ?  Consent obtained:  Verbal ?  Consent given by:  Patient ?  Risks discussed:  Bleeding, infection, incomplete drainage and damage to other organs ?  Alternatives discussed:  No treatment ?Universal protocol:  ?  Patient identity confirmed:  Verbally with patient ?Location:  ?  Type:  Abscess ?  Size:  1 ?  Location:  Lower extremity ?  Lower extremity location:  Leg (R) ?  Leg location:  R upper leg ?Pre-procedure details:  ?  Skin preparation:  Chlorhexidine with alcohol ?Sedation:  ?  Sedation type:  None ?Procedure type:  ?  Complexity:  Simple ?Procedure details:  ?  Ultrasound guidance: no   ?  Needle aspiration: no   ?  Drainage:  Bloody ?  Drainage amount:  Scant ?  Wound treatment:  Wound left open ?  Packing materials:  None ?Post-procedure details:  ?  Procedure completion:  Tolerated well, no immediate complications ?Marland Kitchen..Incision and Drainage ? ?Date/Time: 05/17/2021 3:15 PM ?Performed by: Gilles ChiquitoSmith, Kamarian Sahakian P, MD ?Authorized by: Gilles ChiquitoSmith, Samadhi Mahurin P, MD  ? ?Consent:  ?  Consent obtained:  Verbal ?  Risks, benefits, and alternatives were discussed:  yes   ?  Risks discussed:  Bleeding, damage to other organs, infection, incomplete drainage and pain ?  Alternatives discussed:  No treatment ?Universal protocol:  ?  Patient identity confirmed:  Verbally with patient ?Location:  ?  Type:  Abscess ?  Size:  2 ?  Location:  Lower extremity ?  Lower  extremity location:  Leg ?  Leg location:  L upper leg ?Pre-procedure details:  ?  Skin preparation:  Antiseptic wash ?Sedation:  ?  Sedation type:  None ?Anesthesia:  ?  Anesthesia method:  Local infiltration ?  Local anesthetic:  Lidocaine 1% WITH epi ?Procedure type:  ?  Complexity:  Simple ?Procedure details:  ?  Ultrasound guidance: yes   ?  Incision types:  Single straight ?  Incision depth:  Dermal ?  Wound management:  Probed and deloculated ?  Drainage:  Purulent ?  Drainage amount:  Moderate ?  Wound treatment:  Wound left open ?  Packing materials:  None ?Post-procedure details:  ?  Procedure completion:  Tolerated well, no immediate complications ?.1-3 Lead EKG Interpretation ?Performed by: Gilles Chiquito, MD ?Authorized by: Gilles Chiquito, MD  ? ?  Interpretation: normal   ?  ECG rate assessment: normal   ?  Rhythm: sinus rhythm   ?  Ectopy: none   ?  Conduction: normal   ? ?The patient is on the cardiac monitor to evaluate for evidence of arrhythmia and/or significant heart rate changes. ? ? ?MEDICATIONS ORDERED IN ED: ?Medications  ?insulin aspart (novoLOG) injection 0-15 Units (15 Units Subcutaneous Given 05/17/21 1414)  ?lactated ringers bolus 1,000 mL (0 mLs Intravenous Stopped 05/17/21 1447)  ?lidocaine-EPINEPHrine (XYLOCAINE W/EPI) 2 %-1:100000 (with pres) injection 30 mL (30 mLs Intradermal Given by Other 05/17/21 1447)  ?acetaminophen (TYLENOL) tablet 1,000 mg (1,000 mg Oral Given 05/17/21 1415)  ?vancomycin (VANCOCIN) IVPB 1000 mg/200 mL premix (1,000 mg Intravenous New Bag/Given 05/17/21 1414)  ?lactated ringers bolus 1,000 mL (1,000 mLs Intravenous New Bag/Given 05/17/21 1504)  ? ? ? ?IMPRESSION /  MDM / ASSESSMENT AND PLAN / ED COURSE  ?I reviewed the triage vital signs and the nursing notes. ?             ?               ? ?Differential diagnosis includes, but is not limited to, syncope secondary to a

## 2021-05-17 NOTE — Consult Note (Signed)
PHARMACY -  BRIEF ANTIBIOTIC NOTE  ? ?Pharmacy has received consult(s) for Vancomycin from an ED provider.  The patient's profile has been reviewed for ht/wt/allergies/indication/available labs.   ? ?One time order(s) placed for  ?Vancomycin 1000 mg  ? ?Further antibiotics/pharmacy consults should be ordered by admitting physician if indicated.       ?                ?Thank you, ?Sharen Hones, PharmD, BCPS ?Clinical Pharmacist   ?05/17/2021  1:44 PM  ?

## 2021-05-17 NOTE — Progress Notes (Signed)
Inpatient Diabetes Program Recommendations ? ?AACE/ADA: New Consensus Statement on Inpatient Glycemic Control (2015) ? ?Target Ranges:  Prepandial:   less than 140 mg/dL ?     Peak postprandial:   less than 180 mg/dL (1-2 hours) ?     Critically ill patients:  140 - 180 mg/dL  ? ?Lab Results  ?Component Value Date  ? GLUCAP 454 (H) 05/17/2021  ? HGBA1C 7.9 (A) 01/21/2021  ? ? Latest Reference Range & Units 05/17/21 12:35  ?Potassium 3.5 - 5.1 mmol/L 4.5  ?Chloride 98 - 111 mmol/L 97 (L)  ?CO2 22 - 32 mmol/L 20 (L)  ?Glucose 70 - 99 mg/dL 956 (HH)  ?BUN 6 - 20 mg/dL 16  ?Creatinine 0.44 - 1.00 mg/dL 3.87  ?Calcium 8.9 - 10.3 mg/dL 8.7 (L)  ?Anion gap 5 - 15  14  ?[facility]): Data is critically high ?(L): Data is abnormally low ? ?Review of Glycemic Control ? ?Diabetes history: DM1 ?Outpatient Diabetes medications: Omnipod insulin pump ?Current orders for Inpatient glycemic control: Novolog 0-15 units correction q 4 hrs. ? ?Inpatient Diabetes Program Recommendations:   ?Please consider: ?-Semglee 8 units qd ?-Novolog 0-6 units q 4 hrs. Until eating, then tid + hs 0-5 units ?-Novolog 2 units tid meal coverage when eating 50% meals. ?-D/C current Novolog 0-15 unit correction scale ?Secure chat sent to Dr. Katrinka Blazing. ? ?See chart below of insulin pump settings from Dr. Lonzo Cloud 01/21/21 Patient is very sensitive to insulin. ?  ?  ?Pump   Omnipod 5 Settings   ?Insulin type   Novolog     ?Basal rate        ?  0000 0.35 u/h   ?       ?       ?       ?I:C ratio        ?  0000 1:20  ?       ?       ?       ?       ?Sensitivity        ?  0000  60  ?       ?Goal        ?  0000  120  ?  ?  ?  ? Thank you, ?Billy Fischer Braileigh Landenberger, RN, MSN, CDE  ?Diabetes Coordinator ?Inpatient Glycemic Control Team ?Team Pager [phone] (8am-5pm) ?05/17/2021 2:28 PM ? ? ? ? ?

## 2021-05-21 ENCOUNTER — Ambulatory Visit: Payer: Self-pay

## 2021-05-21 DIAGNOSIS — Z0000 Encounter for general adult medical examination without abnormal findings: Secondary | ICD-10-CM

## 2021-05-21 NOTE — Progress Notes (Signed)
Pt presents today for physical labs, will return to clinic for scheduled physical.  

## 2021-05-22 LAB — CMP12+LP+TP+TSH+6AC+CBC/D/PLT
ALT: 27 IU/L (ref 0–32)
AST: 31 IU/L (ref 0–40)
Albumin/Globulin Ratio: 1.5 (ref 1.2–2.2)
Albumin: 3.7 g/dL — ABNORMAL LOW (ref 3.8–4.9)
Alkaline Phosphatase: 162 IU/L — ABNORMAL HIGH (ref 44–121)
BUN/Creatinine Ratio: 12 (ref 9–23)
BUN: 7 mg/dL (ref 6–24)
Basophils Absolute: 0.1 10*3/uL (ref 0.0–0.2)
Basos: 1 %
Bilirubin Total: 0.4 mg/dL (ref 0.0–1.2)
Calcium: 8.9 mg/dL (ref 8.7–10.2)
Chloride: 98 mmol/L (ref 96–106)
Chol/HDL Ratio: 2.1 ratio (ref 0.0–4.4)
Cholesterol, Total: 144 mg/dL (ref 100–199)
Creatinine, Ser: 0.57 mg/dL (ref 0.57–1.00)
EOS (ABSOLUTE): 0.2 10*3/uL (ref 0.0–0.4)
Eos: 2 %
Estimated CHD Risk: <0.5 times avg. (ref 0.0–1.0)
Free Thyroxine Index: 1.9 (ref 1.2–4.9)
GGT: 6 IU/L (ref 0–60)
Globulin, Total: 2.4 g/dL (ref 1.5–4.5)
Glucose: 191 mg/dL — ABNORMAL HIGH (ref 70–99)
HDL: 68 mg/dL (ref >39)
Hematocrit: 41.3 % (ref 34.0–46.6)
Hemoglobin: 13.7 g/dL (ref 11.1–15.9)
Immature Grans (Abs): 0.1 10*3/uL (ref 0.0–0.1)
Immature Granulocytes: 1 %
Iron: 44 ug/dL (ref 27–159)
LDH: 225 IU/L (ref 119–226)
LDL Chol Calc (NIH): 63 mg/dL (ref 0–99)
Lymphocytes Absolute: 2.6 10*3/uL (ref 0.7–3.1)
Lymphs: 27 %
MCH: 29 pg (ref 26.6–33.0)
MCHC: 33.2 g/dL (ref 31.5–35.7)
MCV: 88 fL (ref 79–97)
Monocytes Absolute: 0.6 10*3/uL (ref 0.1–0.9)
Monocytes: 6 %
Neutrophils Absolute: 6.4 10*3/uL (ref 1.4–7.0)
Neutrophils: 63 %
Phosphorus: 4.1 mg/dL (ref 3.0–4.3)
Platelets: 328 10*3/uL (ref 150–450)
Potassium: 3.4 mmol/L — ABNORMAL LOW (ref 3.5–5.2)
RBC: 4.72 x10E6/uL (ref 3.77–5.28)
RDW: 12.6 % (ref 11.7–15.4)
Sodium: 139 mmol/L (ref 134–144)
T3 Uptake Ratio: 26 % (ref 24–39)
T4, Total: 7.2 ug/dL (ref 4.5–12.0)
TSH: 2.6 u[IU]/mL (ref 0.450–4.500)
Total Protein: 6.1 g/dL (ref 6.0–8.5)
Triglycerides: 62 mg/dL (ref 0–149)
Uric Acid: 1.6 mg/dL — ABNORMAL LOW (ref 3.0–7.2)
VLDL Cholesterol Cal: 13 mg/dL (ref 5–40)
WBC: 9.9 10*3/uL (ref 3.4–10.8)
eGFR: 108 mL/min/{1.73_m2} (ref >59)

## 2021-05-22 LAB — MICROALBUMIN / CREATININE URINE RATIO

## 2021-05-22 LAB — HGB A1C W/O EAG: Hgb A1c MFr Bld: 8.7 % — ABNORMAL HIGH (ref 4.8–5.6)

## 2021-05-24 ENCOUNTER — Other Ambulatory Visit: Payer: Self-pay

## 2021-05-24 ENCOUNTER — Ambulatory Visit: Payer: 59 | Admitting: Physician Assistant

## 2021-05-24 DIAGNOSIS — Z0000 Encounter for general adult medical examination without abnormal findings: Secondary | ICD-10-CM

## 2021-05-24 LAB — POCT URINALYSIS DIPSTICK
Bilirubin, UA: NEGATIVE
Blood, UA: NEGATIVE
Glucose, UA: NEGATIVE
Ketones, UA: NEGATIVE
Leukocytes, UA: NEGATIVE
Nitrite, UA: NEGATIVE
Protein, UA: NEGATIVE
Spec Grav, UA: 1.025 (ref 1.010–1.025)
Urobilinogen, UA: 0.2 E.U./dL
pH, UA: 6 (ref 5.0–8.0)

## 2021-05-26 ENCOUNTER — Ambulatory Visit: Payer: Self-pay | Admitting: Physician Assistant

## 2021-05-26 ENCOUNTER — Encounter: Payer: Self-pay | Admitting: Physician Assistant

## 2021-05-26 ENCOUNTER — Other Ambulatory Visit: Payer: Self-pay | Admitting: Physician Assistant

## 2021-05-26 VITALS — BP 150/81 | HR 72 | Temp 97.6°F | Resp 14 | Ht 61.0 in | Wt 116.0 lb

## 2021-05-26 DIAGNOSIS — R0782 Intercostal pain: Secondary | ICD-10-CM

## 2021-05-26 DIAGNOSIS — Z0000 Encounter for general adult medical examination without abnormal findings: Secondary | ICD-10-CM

## 2021-05-26 DIAGNOSIS — E876 Hypokalemia: Secondary | ICD-10-CM

## 2021-05-26 LAB — MICROALBUMIN / CREATININE URINE RATIO
Creatinine, Urine: 91.7 mg/dL
Microalb/Creat Ratio: 7 mg/g creat (ref 0–29)
Microalbumin, Urine: 6.4 ug/mL

## 2021-05-26 MED ORDER — POTASSIUM CHLORIDE CRYS ER 20 MEQ PO TBCR: 20.0000 meq | EXTENDED_RELEASE_TABLET | Freq: Every day | ORAL | 1 refills | Status: DC

## 2021-05-26 MED ORDER — LIDOCAINE 5 % EX PTCH: 1.0000 | MEDICATED_PATCH | CUTANEOUS | 0 refills | Status: DC

## 2021-05-26 NOTE — Progress Notes (Signed)
? ?[facility] [place] [facility] ? ?____________________________________________ ? ? None  ?  (approximate) ? ?I have reviewed the triage vital signs and the nursing notes. ? ? ?HISTORY ? ?Chief Complaint ?Annual Exam ? ? ?HPI ?Lori Carr is a 56 y.o. female patient presents for annual physical exam.  Patient voices concern for left lateral chest wall pain which is secondary to a fall due to a syncopal episode.  Patient was treated ER for his complaint of continued head pain with deep breathing.  Patient also wished to discuss her labs showing a low potassium levels.  Patient has an appointment this afternoon with endocrinologist secondary to poor control of her diabetic condition. ?   ? ?  ? ? ?Past Medical History:  ?Diagnosis Date  ? chronic vaginal bleeding   ? Diabetes mellitus   ? type 2  ? DKA (diabetic ketoacidoses)   ? History of Elevated LDL cholesterol level   ? Obesity   ? ? ?Patient Active Problem List  ? Diagnosis Date Noted  ? Encounter for counseling 02/05/2021  ? Closed fracture of proximal phalanx of great toe 08/17/2020  ? Pain of toe of left foot 08/17/2020  ? Anxiety as acute reaction to exceptional stress 09/26/2019  ? Sinus tachycardia 09/26/2019  ? 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 Pre-op evaluation 05/23/2019  ? Radial styloid tenosynovitis of right hand 03/27/2019  ? Gastritis without bleeding 02/11/2019  ? Sprain of knee 02/11/2019  ? Hand pain 09/20/2018  ? Neuropathy 09/03/2018  ? Type 1 diabetes mellitus with hyperglycemia (HCC) 08/31/2018  ? Diabetic ketoacidosis (HCC) 06/06/2018  ? AKI (acute kidney injury) (HCC) 06/06/2018  ? Postmenopausal bleeding 08/16/2017  ? BMI 40.0-44.9, adult (HCC) 06/07/2017  ? Morbid obesity (HCC) 06/07/2017  ? Snoring 04/27/2017  ? Type 1 diabetes mellitus without complication (HCC) 07/31/2014  ? Osteoarthritis of knee 07/22/2014  ? Dupuytren's contracture of foot 06/05/2013  ? ? ?Past Surgical History:  ?Procedure Laterality  Date  ? CESAREAN SECTION    ? x3  ? COLONOSCOPY  2018  ? DILITATION & CURRETTAGE/HYSTROSCOPY WITH NOVASURE ABLATION N/A 07/03/2018  ? Procedure: DILATATION & CURETTAGE/HYSTEROSCOPY WITH Possible NOVASURE ABLATION;  Surgeon: Olivia Mackie, MD;  Location: [facility] [place];  Service: Gynecology;  Laterality: N/A;  ? LAPAROSCOPIC GASTRIC RESTRICTIVE DUODENAL PROCEDURE (DUODENAL SWITCH) N/A 07/11/2019  ? tissue removed from stomach  as child  ? TYMPANOSTOMY TUBE PLACEMENT  as child  ? WRIST SURGERY Right 03/29/2019  ? ? ?Prior to Admission medications   ?Medication Sig Start Date End Date Taking? Authorizing Provider  ?Continuous Blood Gluc Sensor (DEXCOM G6 SENSOR) MISC Inject 1 Device into the skin as directed. Apply one sensor once every 10 days. 02/18/21  Yes Shamleffer, Konrad Dolores, MD  ?glucose blood (ONETOUCH VERIO) test strip Use as instructed 07/30/19  Yes Joni Reining, PA-C  ?hydrOXYzine (VISTARIL) 25 MG capsule Take 1 capsule (25 mg total) by mouth every 8 (eight) hours as needed. 02/23/21  Yes Joni Reining, PA-C  ?IBU 600 MG tablet Take by mouth. 11/04/19  Yes [provider]  ?insulin aspart (NOVOLOG) 100 UNIT/ML injection Use a directed 20 units max daily as needed 04/07/21  Yes Shamleffer, Konrad Dolores, MD  ?insulin glargine (LANTUS SOLOSTAR) 100 UNIT/ML Solostar Pen Inject 11 Units into the skin daily. 04/10/20  Yes Shamleffer, Konrad Dolores, MD  ?Insulin Pen Needle 32G X 4 MM MISC 1 Device by Does not apply route in the morning, at  noon, in the evening, and at bedtime. 07/23/19  Yes Shamleffer, Konrad Dolores, MD  ?Lancets MISC Use as instructed 07/29/19  Yes Bridget Hartshorn L, PA-C  ?lidocaine (LIDODERM) 5 % Place 1 patch onto the skin daily. Remove & Discard patch within 12 hours or as directed by MD 11/10/20  Yes Joni Reining, PA-C  ?lisinopril (ZESTRIL) 10 MG tablet Take 1 tablet (10 mg total) by mouth daily. 11/10/20  Yes Joni Reining, PA-C  ?methylPREDNISolone  (MEDROL DOSEPAK) 4 MG TBPK tablet Take Tapered dose as directed 02/23/21  Yes Joni Reining, PA-C  ?NOVOLOG FLEXPEN 100 UNIT/ML FlexPen Inject into the skin as directed. 03/23/21  Yes [provider]  ?oxyCODONE-acetaminophen (PERCOCET) 7.5-325 MG tablet Take 1 tablet by mouth every 4 (four) hours as needed for severe pain. 11/10/20  Yes Joni Reining, PA-C  ?pantoprazole (PROTONIX) 40 MG tablet Take 1 tablet by mouth daily. 03/23/20  Yes [provider]  ?Stann Ore MINI PEN NEEDLES 31G X 6 MM MISC USE AS DIRECTED FOUR TIMES DAILY 05/16/20  Yes Shamleffer, Konrad Dolores, MD  ? ? ?Allergies ?Amoxicillin-pot clavulanate and Vicodin [hydrocodone-acetaminophen] ? ?Family History  ?Problem Relation Age of Onset  ? Ovarian cancer Mother 96  ?     deceased age 88 stage IV ovarian cancer  ? Breast cancer Maternal Aunt 50  ? Breast cancer Paternal Grandmother 94  ? Breast cancer Paternal Aunt 80  ? Stroke Brother   ? Diabetes Mellitus I Brother   ? ? ?Social History ?Social History  ? ?Tobacco Use  ? Smoking status: Never  ? Smokeless tobacco: Never  ?Vaping Use  ? Vaping Use: Never used  ?Substance Use Topics  ? Alcohol use: No  ? Drug use: No  ? ? ?Review of Systems ?Constitutional: No fever/chills ?Eyes: No visual changes. ?ENT: No sore throat. ?Cardiovascular: Denies chest pain. ?Respiratory: Denies shortness of breath. ?Gastrointestinal: No abdominal pain.  No nausea, no vomiting.  No diarrhea.  No constipation. ?Genitourinary: Negative for dysuria. ?Musculoskeletal: Negative for back pain. ?Skin: Negative for rash. ?Neurological: Negative for headaches, focal weakness or numbness. ?Psychiatric: Anxiety ?Endocrine: Diabetes ?Hematological/Lymphatic:  ?Allergic/Immunilogical: Amoxicillin and Vicodin ?____________________________________________ ? ? ?PHYSICAL EXAM: ? ?VITAL SIGNS: Temperature is 97.6, respiration 14, pulse 72, BP is 150/81, and patient 97% O2 sat on room air.  Patient was 116 pounds BMI  is 21.92. ?Constitutional: Alert and oriented. Well appearing and in no acute distress. ?Eyes: Conjunctivae are normal. PERRL. EOMI. ?Head: Atraumatic. ?Nose: No congestion/rhinnorhea. ?Mouth/Throat: Mucous membranes are moist.  Oropharynx non-erythematous. ?Neck: No stridor.  No cervical spine tenderness to palpation. ?Hematological/Lymphatic/Immunilogical: No cervical lymphadenopathy. ?Cardiovascular: Normal rate, regular rhythm. Grossly normal heart sounds.  Good peripheral circulation. ?Respiratory: Normal respiratory effort.  No retractions. Lungs CTAB. ?Gastrointestinal: Soft and nontender. No distention. No abdominal bruits. No CVA tenderness. ?Genitourinary:  ?Musculoskeletal: No lower extremity tenderness nor edema.  No joint effusions. ?Neurologic:  Normal speech and language. No gross focal neurologic deficits are appreciated. No gait instability. ?Skin:  Skin is warm, dry and intact. No rash noted. ?Psychiatric: Mood and affect are normal. Speech and behavior are normal. ? ?____________________________________________ ?  ?LABS ? ?___Order: [id number] ?Status: Final result    ?Visible to patient: Yes (seen)    ?Next appt: 05/28/2021 at 01:00 PM in Endocrinology Scarlette Shorts, MD)    ?Dx: Routine adult health maintenance    ?0 Result Notes ?        ?Component Ref Range & Units 2 d  ago ?(05/24/21) 9 d ago ?(05/17/21) 1 yr ago ?(05/11/20) 2 yr ago ?(03/19/19) 2 yr ago ?(06/06/18)  ?Color, UA  Yellow   Yellow  yellow    ?Clarity, UA  Clear   Clear  clear    ?Glucose, UA Negative Negative   Positive Abnormal  CM  Positive Abnormal  CM    ?Bilirubin, UA  Negative   Negative  negative    ?Ketones, UA  Negative   Negative  negative    ?Spec Grav, UA 1.010 - 1.025 1.025   1.025  1.025    ?Blood, UA  Negative   Negative  negative    ?pH, UA 5.0 - 8.0 6.0   6.0  6.0    ?Protein, UA Negative Negative   Negative  Negative    ?Urobilinogen, UA 0.2 or 1.0 E.U./dL 0.2   0.2  0.2    ?Nitrite, UA  Negative   Negative   negative    ?Leukocytes, UA Negative Negative   Negative  Negative    ?Appearance   CLEAR Abnormal  R      HAZY Abnormal  R   ?        ? ?          ?Component Ref Range & Units 5 d ago ?(05/21/21) 9 d ago ?

## 2021-05-26 NOTE — Progress Notes (Signed)
Pt presents today to complete physical. Pt states left side still hurts from when she fell. ?

## 2021-05-28 ENCOUNTER — Ambulatory Visit (INDEPENDENT_AMBULATORY_CARE_PROVIDER_SITE_OTHER): Payer: 59 | Admitting: Internal Medicine

## 2021-05-28 ENCOUNTER — Encounter: Payer: Self-pay | Admitting: Internal Medicine

## 2021-05-28 VITALS — BP 134/82 | HR 78 | Ht 61.0 in | Wt 112.0 lb

## 2021-05-28 DIAGNOSIS — E1065 Type 1 diabetes mellitus with hyperglycemia: Secondary | ICD-10-CM | POA: Diagnosis not present

## 2021-05-28 MED ORDER — INSULIN LISPRO 100 UNIT/ML IJ SOLN: INTRAMUSCULAR | 6 refills | Status: DC

## 2021-05-28 NOTE — Patient Instructions (Signed)
?  Pump   Omnipod 5 Settings   ?Insulin type   Novolog    ?Basal rate      ? 0000 0.35 u/h   ? 0800 0.40  ?    ?    ?I:C ratio      ? 0000 1:20  ? 0500 16  ?    ?    ?    ?Sensitivity      ? 0000  50  ?    ?Goal      ? 0000  120  ?  ?  ? ?

## 2021-05-28 NOTE — Progress Notes (Signed)
?  ?                                                                              ? ?Name: Lori Carr  ?Age/ Sex: 56 y.o., female   ?MRN/ DOB: [id number], 1965-09-19    ? ?PCP: Joni Reining, PA-C   ?Reason for Endocrinology Evaluation: Type 1 Diabetes Mellitus  ?Initial Endocrine Consultative Visit: 07/06/2018  ? ? ?PATIENT IDENTIFIER: Lori Carr is a 56 y.o. 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 The patient has followed with Endocrinology clinic since 07/06/2018 for consultative assistance with management of her diabetes. ? ? ? ? ?DIABETIC HISTORY:  ?Lori Carr was initially diagnosed with T2DM in 2007, she has been on MDI regimen since her diagnosis, she also was on Metformin at somepoint and was subsequently switched to Dapagliflozin-metformin (xigduo) which was stopped after hospitalization in 06/2018 for DKA. GAD-65 was elevated at  137 IU/mL which confirmed a diagnosis of T1DM . Her hemoglobin A1c has ranged from 7.9% in 2015, peaking at 9.2% in 2020. ? 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 ? ?Started using the Omnipod 07/2020 ? ?Brother with T1DM ? ?SUBJECTIVE:  ? ?During the last visit (01/21/2021): A1c 7.9 %. We adjusted MDI regimen  ? ? ?Today (05/28/2021): Lori Carr is here for a follow up on diabetes.  She checks her blood sugars multiple times a day through the CGM  . She has had hypoglycemic episodes since her surgery  ? ?She had an ED visit for an abscess 05/17/2021. S/P I & D  ?  ?Rare  vomiting  ?Denies constipation or diarrhea  ? ? ?HOME DIABETES REGIMEN:  ?Novolog  ? ?This patient with type 1 diabetes is treated with Omnipod (insulin pump). During the visit the pump basal and bolus doses were reviewed including carb/insulin rations and supplemental doses. The clinical list was updated. The glucose meter download was reviewed in detail to determine if the current pump settings are providing the best  glycemic control without excessive hypoglycemia. ? ?Pump and meter download: ? ? ? ?Pump   Omnipod 5 Settings   ?Insulin type   Novolog    ?Basal rate      ? 0000 0.35 u/h   ?    ?    ?    ?I:C ratio      ? 0000 1:18  ?    ?    ?    ?    ?Sensitivity      ? 0000  60  ?    ?Goal      ? 0000  120  ?  ?  ? ? ? ? ? ? ?Type & Model of Pump: Omnipod 5 ?Insulin Type: Currently using Novolog . ? ?PUMP STATISTICS: ?Average BG: 287 ?Average Daily Carbs (g): 83.6 ?Average Total Daily Insulin: 24.6 ?Average Daily Basal: 15.3 (62%) ?Average Daily Bolus: 9.3(38%) ? ?Over-rides 55 % of boluses ? ? ? ?CONTINUOUS GLUCOSE MONITORING RECORD INTERPRETATION   ? ?Dates of Recording: 4/15-4/28/2023 ? ?Sensor description: dexcom ? ?Results statistics: ?  ?CGM use % of time 64  ?Average and SD 268/100  ?  Time in range    21  %  ?% Time Above 180 21  ?% Time above 250 57  ?% Time Below target <1  ? ? ?Glycemic patterns summary: Hyperglycemia noted during the day and to less extent during the night  ? ?Hyperglycemic episodes  day and night  ? ?Hypoglycemic episodes occurred  after a bolus ? ?Overnight periods: trends down - variable  ? ? ? ? ? ? ?HISTORY:  ?Past Medical History:  ?Past Medical History:  ?Diagnosis Date  ? chronic vaginal bleeding   ? Diabetes mellitus   ? type 2  ? DKA (diabetic ketoacidoses)   ? History of Elevated LDL cholesterol level   ? Obesity   ? ?Past Surgical History:  ?Past Surgical History:  ?Procedure Laterality Date  ? CESAREAN SECTION    ? x3  ? COLONOSCOPY  2018  ? DILITATION & CURRETTAGE/HYSTROSCOPY WITH NOVASURE ABLATION N/A 07/03/2018  ? Procedure: DILATATION & CURETTAGE/HYSTEROSCOPY WITH Possible NOVASURE ABLATION;  Surgeon: Olivia Mackie, MD;  Location: [facility] [place];  Service: Gynecology;  Laterality: N/A;  ? LAPAROSCOPIC GASTRIC RESTRICTIVE DUODENAL PROCEDURE (DUODENAL SWITCH) N/A 07/11/2019  ? tissue removed from stomach  as child  ? TYMPANOSTOMY TUBE PLACEMENT  as child  ? WRIST SURGERY  Right 03/29/2019  ? ?Social History:  reports that she has never smoked. She has never used smokeless tobacco. She reports that she does not drink alcohol and does not use drugs. ?Family History:  ?Family History  ?Problem Relation Age of Onset  ? Ovarian cancer Mother 26  ?     deceased age 58 stage IV ovarian cancer  ? Breast cancer Maternal Aunt 50  ? Breast cancer Paternal Grandmother 48  ? Breast cancer Paternal Aunt 39  ? Stroke Brother   ? Diabetes Mellitus I Brother   ? ? ? ?HOME MEDICATIONS: ?Allergies as of 05/28/2021   ? ?   Reactions  ? Amoxicillin-pot Clavulanate Itching  ? Did it involve swelling of the face/tongue/throat, SOB, or low BP? No ?Did it involve sudden or severe rash/hives, skin peeling, or any reaction on the inside of your mouth or nose? No ?Did you need to seek medical attention at a hospital or doctor's office? No ?When did it last happen?  A long time ago     ?If all above answers are ?NO?, may proceed with cephalosporin use.  ? Vicodin [hydrocodone-acetaminophen] Nausea Only  ? ?  ? ?  ?Medication List  ?  ? ?  ? Accurate as of May 28, 2021  1:07 PM. If you have any questions, ask your nurse or doctor.  ?  ?  ? ?  ? ?STOP taking these medications   ? ?Lantus SoloStar 100 UNIT/ML Solostar Pen ?Generic drug: insulin glargine ?Stopped by: Scarlette Shorts, MD ?  ? ?  ? ?TAKE these medications   ? ?Dexcom G6 Sensor Misc ?Inject 1 Device into the skin as directed. Apply one sensor once every 10 days. ?  ?hydrOXYzine 25 MG capsule ?Commonly known as: VISTARIL ?Take 1 capsule (25 mg total) by mouth every 8 (eight) hours as needed. ?  ?IBU 600 MG tablet ?Generic drug: ibuprofen ?Take by mouth. ?  ?NovoLOG FlexPen 100 UNIT/ML FlexPen ?Generic drug: insulin aspart ?Inject into the skin as directed. ?  ?insulin aspart 100 UNIT/ML injection ?Commonly known as: novoLOG ?Use a directed 20 units max daily as needed ?  ?Insulin Pen Needle 32G X 4 MM Misc ?1 Device by  Does not apply route in the  morning, at noon, in the evening, and at bedtime. ?  ?UltiCare Mini Pen Needles 31G X 6 MM Misc ?Generic drug: Insulin Pen Needle ?USE AS DIRECTED FOUR TIMES DAILY ?  ?Lancets Misc ?Use as instructed ?  ?lidocaine 5 % ?Commonly known as: Lidoderm ?Place 1 patch onto the skin daily. Remove & Discard patch within 12 hours or as directed by MD ?  ?lisinopril 10 MG tablet ?Commonly known as: ZESTRIL ?Take 1 tablet (10 mg total) by mouth daily. ?  ?methylPREDNISolone 4 MG Tbpk tablet ?Commonly known as: MEDROL DOSEPAK ?Take Tapered dose as directed ?  ?OneTouch Verio test strip ?Generic drug: glucose blood ?Use as instructed ?  ?oxyCODONE-acetaminophen 7.5-325 MG tablet ?Commonly known as: Percocet ?Take 1 tablet by mouth every 4 (four) hours as needed for severe pain. ?  ?pantoprazole 40 MG tablet ?Commonly known as: PROTONIX ?Take 1 tablet by mouth daily. ?  ?potassium chloride SA 20 MEQ tablet ?Commonly known as: KLOR-CON M ?Take 1 tablet (20 mEq total) by mouth daily. ?  ? ?  ? ? ? ?OBJECTIVE:  ? ?Vital Signs: BP 134/82 (BP Location: Left Arm, Patient Position: Sitting, Cuff Size: Small)   Pulse 78   Ht 5\' 1"  (1.549 m)   Wt 112 lb (50.8 kg)   SpO2 96%   BMI 21.16 kg/m?   ?Wt Readings from Last 3 Encounters:  ?05/28/21 112 lb (50.8 kg)  ?05/26/21 116 lb (52.6 kg)  ?05/17/21 108 lb (49 kg)  ? ? ? ?Exam: ?General: Pt appears well and is in NAD  ?Lungs: Clear with good BS bilat with no rales, rhonchi, or wheezes  ?Heart: RRR   ?Abdomen: Normoactive bowel sounds, soft, nontender, incisions clean  ?Extremities: NO edema  pretibial edema. Marland Kitchen.  ?Neuro: MS is good with appropriate affect, pt is alert and Ox3  ? ? ?DM foot exam: 05/28/2021 ?  ?The skin of the feet is intact without sores or ulcerations. ?The pedal pulses are 2+ on right and 2+ on left. ?The sensation is intact to a screening 5.07, 10 gram monofilament bilaterally ? ? ?DATA REVIEWED: ? ?Lab Results  ?Component Value Date  ? HGBA1C 8.7 (H) 05/21/2021  ? HGBA1C  7.9 (A) 01/21/2021  ? HGBA1C 8.8 (H) 05/11/2020  ? ?Lab Results  ?Component Value Date  ? LDLCALC 63 05/21/2021  ? CREATININE 0.57 05/21/2021  ? ? ?Lab Results  ?Component Value Date  ? CHOL 144 05/21/2021  ? H

## 2021-05-31 ENCOUNTER — Other Ambulatory Visit: Payer: Self-pay

## 2021-05-31 MED ORDER — INSULIN LISPRO 100 UNIT/ML IJ SOLN: INTRAMUSCULAR | 6 refills | Status: DC

## 2021-06-03 ENCOUNTER — Other Ambulatory Visit (HOSPITAL_COMMUNITY): Payer: Self-pay

## 2021-06-04 ENCOUNTER — Other Ambulatory Visit: Payer: Self-pay | Admitting: Internal Medicine

## 2021-06-04 ENCOUNTER — Telehealth: Payer: Self-pay | Admitting: Pharmacy Technician

## 2021-06-04 ENCOUNTER — Other Ambulatory Visit (HOSPITAL_COMMUNITY): Payer: Self-pay

## 2021-06-04 MED ORDER — FIASP 100 UNIT/ML IJ SOLN: INTRAMUSCULAR | 3 refills | Status: DC

## 2021-06-04 NOTE — Telephone Encounter (Signed)
Patient Advocate Encounter ?  ?Received notification that prior authorization for HumaLOG 100UNIT/ML solution is required. ?  ?PA submitted on 06/04/2021 ?Key [id number] ?Status is pending ?   ? ? ?Jinger Neighbors, CPhT-Adv ?Pharmacy Patient Advocate Specialist ?[facility] Patient Advocate Team ?Direct Number: [phone]  Fax: [phone] ? ?

## 2021-06-04 NOTE — Telephone Encounter (Signed)
Patient Advocate Encounter ? ?The request for prior authorization for HumaLOG 100UNIT/ML solution has been cancelled due to therapy changed to Fiasp 100 UNIT/ML solution.   ? ?Patient co-pay for Fiasp 100 UNIT/ML solution is $49.40 ?  ? ?Jinger Neighbors, CPhT-Adv ?Pharmacy Patient Advocate Specialist ?[facility] Patient Advocate Team ?Direct Number: [phone]  Fax: [phone] ? ?

## 2021-06-07 ENCOUNTER — Telehealth: Payer: Self-pay

## 2021-06-07 ENCOUNTER — Other Ambulatory Visit (HOSPITAL_COMMUNITY): Payer: Self-pay

## 2021-06-07 MED ORDER — FIASP 100 UNIT/ML IJ SOLN: INTRAMUSCULAR | 3 refills | Status: DC

## 2021-06-07 NOTE — Telephone Encounter (Signed)
Pharmacy requesting new script to be sent on Fiasp with new direction. I think the 83ml just needs to be changed to units in directions.  ?

## 2021-06-09 ENCOUNTER — Other Ambulatory Visit (HOSPITAL_COMMUNITY): Payer: Self-pay

## 2021-06-24 ENCOUNTER — Other Ambulatory Visit: Payer: Self-pay

## 2021-06-24 DIAGNOSIS — E876 Hypokalemia: Secondary | ICD-10-CM

## 2021-06-24 NOTE — Progress Notes (Unsigned)
Pt presents today to recheck potassium.

## 2021-06-25 LAB — BASIC METABOLIC PANEL
BUN/Creatinine Ratio: 14 (ref 9–23)
BUN: 10 mg/dL (ref 6–24)
CO2: 26 mmol/L (ref 20–29)
Calcium: 8.9 mg/dL (ref 8.7–10.2)
Chloride: 102 mmol/L (ref 96–106)
Creatinine, Ser: 0.72 mg/dL (ref 0.57–1.00)
Glucose: 362 mg/dL — ABNORMAL HIGH (ref 70–99)
Potassium: 4.4 mmol/L (ref 3.5–5.2)
Sodium: 142 mmol/L (ref 134–144)
eGFR: 99 mL/min/{1.73_m2} (ref >59)

## 2021-07-01 ENCOUNTER — Other Ambulatory Visit: Payer: Self-pay | Admitting: Physician Assistant

## 2021-07-01 DIAGNOSIS — Z1231 Encounter for screening mammogram for malignant neoplasm of breast: Secondary | ICD-10-CM

## 2021-07-14 ENCOUNTER — Other Ambulatory Visit: Payer: Self-pay

## 2021-07-14 MED ORDER — DEXCOM G6 TRANSMITTER MISC: 3 refills | Status: DC

## 2021-07-21 DIAGNOSIS — Z9884 Bariatric surgery status: Secondary | ICD-10-CM | POA: Diagnosis not present

## 2021-07-22 DIAGNOSIS — D0439 Carcinoma in situ of skin of other parts of face: Secondary | ICD-10-CM | POA: Diagnosis not present

## 2021-07-22 DIAGNOSIS — D485 Neoplasm of uncertain behavior of skin: Secondary | ICD-10-CM | POA: Diagnosis not present

## 2021-07-22 DIAGNOSIS — D1801 Hemangioma of skin and subcutaneous tissue: Secondary | ICD-10-CM | POA: Diagnosis not present

## 2021-07-28 ENCOUNTER — Ambulatory Visit: Payer: 59 | Admitting: Internal Medicine

## 2021-07-30 DIAGNOSIS — H524 Presbyopia: Secondary | ICD-10-CM | POA: Diagnosis not present

## 2021-08-11 DIAGNOSIS — D0439 Carcinoma in situ of skin of other parts of face: Secondary | ICD-10-CM | POA: Diagnosis not present

## 2021-08-12 ENCOUNTER — Ambulatory Visit
Admission: RE | Admit: 2021-08-12 | Discharge: 2021-08-12 | Disposition: A | Discharge status: Home / Self Care | Payer: 59 | Source: Ambulatory Visit | Attending: Physician Assistant | Admitting: Physician Assistant

## 2021-08-12 DIAGNOSIS — Z1231 Encounter for screening mammogram for malignant neoplasm of breast: Secondary | ICD-10-CM | POA: Diagnosis not present

## 2021-08-13 ENCOUNTER — Other Ambulatory Visit: Payer: Self-pay | Admitting: Physician Assistant

## 2021-08-13 DIAGNOSIS — N6489 Other specified disorders of breast: Secondary | ICD-10-CM

## 2021-08-13 DIAGNOSIS — R928 Other abnormal and inconclusive findings on diagnostic imaging of breast: Secondary | ICD-10-CM

## 2021-08-13 DIAGNOSIS — R921 Mammographic calcification found on diagnostic imaging of breast: Secondary | ICD-10-CM

## 2021-08-16 ENCOUNTER — Other Ambulatory Visit: Payer: Self-pay | Admitting: Physician Assistant

## 2021-08-16 ENCOUNTER — Ambulatory Visit: Payer: Self-pay | Admitting: Physician Assistant

## 2021-08-16 ENCOUNTER — Encounter: Payer: Self-pay | Admitting: Physician Assistant

## 2021-08-16 DIAGNOSIS — N6081 Other benign mammary dysplasias of right breast: Secondary | ICD-10-CM

## 2021-08-16 MED ORDER — SULFAMETHOXAZOLE-TRIMETHOPRIM 800-160 MG PO TABS: 1.0000 | ORAL_TABLET | Freq: Two times a day (BID) | ORAL | 0 refills | Status: DC

## 2021-08-16 MED ORDER — NAPROXEN 500 MG PO TABS: 500.0000 mg | ORAL_TABLET | Freq: Two times a day (BID) | ORAL | Status: DC

## 2021-08-16 NOTE — Progress Notes (Signed)
   Subjective: Abscess    Patient ID: Lori Carr, female    DOB: December 09, 1965, 56 y.o.   MRN: [id number]  HPI Patient with a papular lesion medial superior aspect of the right breast.  Onset of complaint x1 week.  Patient has been applying hot compresses and trying to express material from the lesion.  States lesion has increased in size in the past 2 days.  Denies any fever chills associated complaint.   Review of Systems Past medical history remarkable for type 1 diabetes.    Objective:   Physical Exam  Nodule lesion on erythematous base as described above.  No active drainage.     Assessment & Plan: Infected sebaceous cyst  Patient advised to start warm compresses and to take antibiotics and anti-inflammatory as directed.  Patient return back in 3 days for repeat evaluation.

## 2021-08-19 ENCOUNTER — Encounter: Payer: Self-pay | Admitting: Physician Assistant

## 2021-08-19 ENCOUNTER — Ambulatory Visit: Payer: Self-pay | Admitting: Physician Assistant

## 2021-08-19 ENCOUNTER — Other Ambulatory Visit: Payer: Self-pay | Admitting: Internal Medicine

## 2021-08-19 VITALS — BP 147/82 | HR 65 | Temp 96.5°F

## 2021-08-19 DIAGNOSIS — N6081 Other benign mammary dysplasias of right breast: Secondary | ICD-10-CM

## 2021-08-19 NOTE — Progress Notes (Signed)
Pt here for f/u on center chest abscess/wound on with provider. States it has been draining a small amount but thinks it has gotten smaller.

## 2021-08-19 NOTE — Progress Notes (Signed)
   Subjective: Chest wall abscess    Patient ID: Lori Carr, female    DOB: 06/25/1965, 56 y.o.   MRN: [id number]  HPI Patient here for reevaluation of chest wall abscess.  Patient was seen 2 days ago started on antibiotics and advised to use warm compresses.  Patient states minimal drainage noted on dressing change.  Denies pain at this time.   Review of Systems Negative septa chief complaint    Objective:   Physical Exam  BP is 147/82, pulse 65, temperature 96.5, patient 90% O2 sat on room air. Nodular lesion is receding erythema.  No drainage.      Assessment & Plan: Chest wall abscess  Advise continue antibiotics and warm compresses.  Advised to change the dressing twice a day and to follow-up in 1 week if no improvement or worsening complaint.

## 2021-09-01 ENCOUNTER — Ambulatory Visit
Admission: RE | Admit: 2021-09-01 | Discharge: 2021-09-01 | Disposition: A | Discharge status: Home / Self Care | Payer: 59 | Source: Ambulatory Visit | Attending: Physician Assistant | Admitting: Physician Assistant

## 2021-09-01 DIAGNOSIS — R928 Other abnormal and inconclusive findings on diagnostic imaging of breast: Secondary | ICD-10-CM | POA: Insufficient documentation

## 2021-09-01 DIAGNOSIS — R921 Mammographic calcification found on diagnostic imaging of breast: Secondary | ICD-10-CM | POA: Insufficient documentation

## 2021-09-01 DIAGNOSIS — R922 Inconclusive mammogram: Secondary | ICD-10-CM | POA: Diagnosis not present

## 2021-09-01 DIAGNOSIS — N6489 Other specified disorders of breast: Secondary | ICD-10-CM | POA: Insufficient documentation

## 2021-09-02 ENCOUNTER — Other Ambulatory Visit: Payer: 59

## 2021-09-20 DIAGNOSIS — S20212A Contusion of left front wall of thorax, initial encounter: Secondary | ICD-10-CM | POA: Diagnosis not present

## 2021-09-28 DIAGNOSIS — S20212A Contusion of left front wall of thorax, initial encounter: Secondary | ICD-10-CM | POA: Diagnosis not present

## 2021-09-28 DIAGNOSIS — R0781 Pleurodynia: Secondary | ICD-10-CM | POA: Diagnosis not present

## 2021-10-21 ENCOUNTER — Encounter: Payer: Self-pay | Admitting: Physician Assistant

## 2021-10-21 ENCOUNTER — Ambulatory Visit: Payer: Self-pay | Admitting: Physician Assistant

## 2021-10-21 VITALS — BP 163/90 | HR 63 | Resp 14 | Ht 61.0 in | Wt 112.0 lb

## 2021-10-21 DIAGNOSIS — H6123 Impacted cerumen, bilateral: Secondary | ICD-10-CM

## 2021-10-21 NOTE — Progress Notes (Signed)
   Subjective: Bilateral ear pain and mild hearing loss    Patient ID: Lori Carr, female    DOB: [birth year], 56 y.o.   MRN: [id number]  HPI Patient presents with 1 week of bilateral ear pain and decreased hearing.  Patient denies vertigo.  Patient has history of cerumen impaction.   Review of Systems Diabetes, GERD, and attention.    Objective:   Physical Exam  BP is 163/90, pulse 63, respiration 14, patient 90% O2 sat on room air.  Patient weighs 112 pounds. HEENT is remarkable bilateral cerumen impaction.      Assessment & Plan: Cerumen impaction  Patient given ear wax softener and advised return back on 10/25/2024 bilateral ear irrigation.

## 2021-10-21 NOTE — Progress Notes (Signed)
Pt presents today for right ear pain about 3-4 days

## 2021-10-25 ENCOUNTER — Ambulatory Visit: Payer: Self-pay | Admitting: Physician Assistant

## 2021-10-25 ENCOUNTER — Encounter: Payer: Self-pay | Admitting: Physician Assistant

## 2021-10-25 DIAGNOSIS — S01511A Laceration without foreign body of lip, initial encounter: Secondary | ICD-10-CM

## 2021-10-25 NOTE — Progress Notes (Signed)
Pt top lip swollen when woke up this morning. Pt states its painful.

## 2021-10-25 NOTE — Progress Notes (Signed)
   Subjective: Right upper lip pain    Patient ID: Lori Carr, female    DOB: [birth year], 56 y.o.   MRN: [id number]  HPI Patient awakened this morning with pain and edema to the right upper lip.  Patient states that she will provocative activities.  Denies any new foods or medicines.   Review of Systems Negative septa chief complaint    Objective:   Physical Exam No acute distress. Patient has inner upper lip laceration.  Patient given Chloraseptic for pain and Benadryl to decrease the edema.  Patient advised to follow-up in 24 hours no improvement or worsening complaint.       Assessment & Plan:

## 2021-10-26 ENCOUNTER — Ambulatory Visit: Payer: Self-pay | Admitting: Physician Assistant

## 2021-10-26 ENCOUNTER — Encounter: Payer: Self-pay | Admitting: Physician Assistant

## 2021-10-26 DIAGNOSIS — R220 Localized swelling, mass and lump, head: Secondary | ICD-10-CM

## 2021-10-26 NOTE — Progress Notes (Signed)
   Subjective: Right mandibular edema    Patient ID: Lori Carr, female    DOB: 05/20/1965, 56 y.o.   MRN: [id number]  HPI Patient presents with right mandibular edema radiating to the right upper lateral lip.  No provocative incident for complaint.  Review of Systems Diabetes and vitamin D deficiency.    Objective:   Physical Exam Not erythematous edema at the right lateral mandibular area radiating to the right upper lip.       Assessment & Plan:Mandibular edema   Further evaluation with ultrasound.

## 2021-10-27 ENCOUNTER — Ambulatory Visit
Admission: RE | Admit: 2021-10-27 | Discharge: 2021-10-27 | Disposition: A | Discharge status: Home / Self Care | Payer: 59 | Source: Ambulatory Visit | Attending: Physician Assistant | Admitting: Physician Assistant

## 2021-10-27 DIAGNOSIS — R220 Localized swelling, mass and lump, head: Secondary | ICD-10-CM | POA: Diagnosis not present

## 2021-10-27 DIAGNOSIS — R221 Localized swelling, mass and lump, neck: Secondary | ICD-10-CM | POA: Diagnosis not present

## 2021-10-28 ENCOUNTER — Ambulatory Visit: Payer: 59 | Admitting: Physician Assistant

## 2021-10-28 ENCOUNTER — Other Ambulatory Visit: Payer: Self-pay | Admitting: Physician Assistant

## 2021-10-28 MED ORDER — NEOMYCIN-POLYMYXIN-HC 3.5-10000-1 OT SOLN: 4.0000 [drp] | Freq: Four times a day (QID) | OTIC | 0 refills | Status: DC

## 2021-10-29 ENCOUNTER — Other Ambulatory Visit: Payer: Self-pay | Admitting: Physician Assistant

## 2021-10-29 ENCOUNTER — Telehealth: Payer: Self-pay

## 2021-10-29 MED ORDER — NAPROXEN 500 MG PO TABS: 500.0000 mg | ORAL_TABLET | Freq: Two times a day (BID) | ORAL | Status: DC

## 2021-10-29 MED ORDER — CLINDAMYCIN HCL 150 MG PO CAPS: 150.0000 mg | ORAL_CAPSULE | Freq: Four times a day (QID) | ORAL | 0 refills | Status: DC

## 2021-10-29 NOTE — Progress Notes (Unsigned)
Lori Carr

## 2021-10-29 NOTE — Telephone Encounter (Signed)
Informed Lori Carr that [employer] reviewed Ultrasound results.  Inflammation of soft tissue that spreads under the skin.  Usually caused by infection & treated with antibiotics.  Lori Carr said the swelling has gone down since she was last in the office on Tuesday.  Ron send Rx for Clindamycin to [facility].  She will take 4x/day x10 days.  Said she will pick it up when she gets off work.  Follow-up appt scheduled for 11/01/2021 at 2:45pm.  AMD

## 2021-11-01 ENCOUNTER — Ambulatory Visit: Payer: Self-pay | Admitting: Physician Assistant

## 2021-11-01 ENCOUNTER — Encounter: Payer: Self-pay | Admitting: Physician Assistant

## 2021-11-01 VITALS — BP 160/80 | HR 76 | Temp 98.6°F | Resp 14

## 2021-11-01 DIAGNOSIS — H04311 Phlegmonous dacryocystitis of right lacrimal passage: Secondary | ICD-10-CM

## 2021-11-01 NOTE — Progress Notes (Signed)
   Subjective: Left facial edema    Patient ID: Lori Carr, female    DOB: 1965/05/08, 56 y.o.   MRN: [id number]  HPI Patient is here today for follow-up of left facial edema.  Patient had an ultrasound on the superficial soft tissue edema consistent with a Phlegmon.  Patient states on the 28th the edema start receding after the ultrasound and has completely resolved.   Review of Systems Arthritis, diabetes,gastritis, and hypertension.    Objective:   Physical Exam  BP is 160/80, pulse 76, respiration 14, temperature 98.6.  Patient 90% O2 sat on room air. Complete resolution of right facial edema.  Nontender to palpation.      Assessment & Plan:Phlegmon   Advised supportive care and follow-up as necessary.

## 2021-11-01 NOTE — Progress Notes (Signed)
Here for follow up post swelling to right side of face and stated it resolved by 10/29/21.  Stated insulin pods caused a clogged pore on anterior left thigh noted with redness and warmth and appearance of a white head.

## 2021-11-12 ENCOUNTER — Telehealth: Payer: Self-pay

## 2021-11-12 ENCOUNTER — Other Ambulatory Visit (HOSPITAL_COMMUNITY): Payer: Self-pay

## 2021-11-12 NOTE — Telephone Encounter (Signed)
Patient states that a vial of insulin is costing her $90 dollars and she can't afford that. Would like to know if there is a less expensive options are is because how the script is written.

## 2021-11-12 NOTE — Telephone Encounter (Signed)
Received notification from [facility] regarding a prior authorization for Insulin Lispro.   Did not submit the prior auth because she is on [place] and it is covered by the insurance.   Key: [id number]

## 2021-11-15 ENCOUNTER — Other Ambulatory Visit: Payer: Self-pay | Admitting: Internal Medicine

## 2021-11-15 MED ORDER — INSULIN LISPRO 100 UNIT/ML IJ SOLN
INTRAMUSCULAR | 3 refills | Status: DC
Start: 2021-11-15 — End: 2022-04-25

## 2021-11-16 ENCOUNTER — Other Ambulatory Visit (HOSPITAL_COMMUNITY): Payer: Self-pay

## 2021-11-17 ENCOUNTER — Other Ambulatory Visit (HOSPITAL_COMMUNITY): Payer: Self-pay

## 2021-11-17 NOTE — Telephone Encounter (Signed)
Patient states she is on vacation and will attempt to use the coupon when she is back in town and let us know

## 2021-11-22 DIAGNOSIS — S93601A Unspecified sprain of right foot, initial encounter: Secondary | ICD-10-CM | POA: Diagnosis not present

## 2021-11-24 ENCOUNTER — Other Ambulatory Visit (HOSPITAL_COMMUNITY): Payer: Self-pay

## 2021-11-24 ENCOUNTER — Other Ambulatory Visit: Payer: Self-pay | Admitting: Obstetrics and Gynecology

## 2021-11-24 DIAGNOSIS — Z01411 Encounter for gynecological examination (general) (routine) with abnormal findings: Secondary | ICD-10-CM | POA: Diagnosis not present

## 2021-11-24 DIAGNOSIS — N951 Menopausal and female climacteric states: Secondary | ICD-10-CM

## 2021-11-24 DIAGNOSIS — R69 Illness, unspecified: Secondary | ICD-10-CM | POA: Diagnosis not present

## 2021-11-24 DIAGNOSIS — Z01419 Encounter for gynecological examination (general) (routine) without abnormal findings: Secondary | ICD-10-CM | POA: Diagnosis not present

## 2021-11-24 DIAGNOSIS — Z6822 Body mass index (BMI) 22.0-22.9, adult: Secondary | ICD-10-CM | POA: Diagnosis not present

## 2021-11-24 DIAGNOSIS — Z124 Encounter for screening for malignant neoplasm of cervix: Secondary | ICD-10-CM | POA: Diagnosis not present

## 2021-11-25 ENCOUNTER — Other Ambulatory Visit (HOSPITAL_COMMUNITY): Payer: Self-pay

## 2021-12-03 ENCOUNTER — Ambulatory Visit: Payer: 59 | Admitting: Internal Medicine

## 2021-12-06 ENCOUNTER — Other Ambulatory Visit (HOSPITAL_COMMUNITY): Payer: Self-pay

## 2021-12-20 DIAGNOSIS — S93601A Unspecified sprain of right foot, initial encounter: Secondary | ICD-10-CM | POA: Diagnosis not present

## 2021-12-20 DIAGNOSIS — M84374A Stress fracture, right foot, initial encounter for fracture: Secondary | ICD-10-CM | POA: Diagnosis not present

## 2022-01-07 ENCOUNTER — Encounter: Payer: Self-pay | Admitting: Internal Medicine

## 2022-01-07 ENCOUNTER — Ambulatory Visit (INDEPENDENT_AMBULATORY_CARE_PROVIDER_SITE_OTHER): Payer: 59 | Admitting: Internal Medicine

## 2022-01-07 VITALS — BP 132/80 | HR 75 | Ht 61.0 in | Wt 119.0 lb

## 2022-01-07 DIAGNOSIS — E1065 Type 1 diabetes mellitus with hyperglycemia: Secondary | ICD-10-CM | POA: Diagnosis not present

## 2022-01-07 NOTE — Progress Notes (Unsigned)
Name: Lori Carr  Age/ Sex: 56 y.o., female   MRN/ DOB: [id number], 1965-07-01     PCP: Lori Feil, PA-C   Reason for Endocrinology Evaluation: Type 1 Diabetes Mellitus  Initial Endocrine Consultative Visit: 07/06/2018    PATIENT IDENTIFIER: [name] is a 55 y.o. 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 The patient has followed with Endocrinology clinic since 07/06/2018 for consultative assistance with management of her diabetes.     DIABETIC HISTORY:  Lori Carr was initially diagnosed with T2DM in 2007, she has been on MDI regimen since her diagnosis, she also was on Metformin at somepoint and was subsequently switched to [place] (xigduo) which was stopped after hospitalization in 06/2018 for DKA. GAD-65 was elevated at  137 IU/mL which confirmed a diagnosis of T1DM . Her hemoglobin A1c has ranged from 7.9% in 2015, peaking at 9.2% in 2020.  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 We adjusted MDI regimen    Today (01/07/2022): Lori Carr is here for a follow up on diabetes.  She checks her blood sugars multiple times a day through the CGM  . She has had hypoglycemic episodes since her surgery   Continues with occasional nausea  Denies constipation     HOME DIABETES REGIMEN:  Novolog   This patient with type 1 diabetes is treated with Omnipod (insulin pump). During the visit the pump basal and bolus doses were reviewed including carb/insulin rations and supplemental doses. The clinical list was updated. The glucose meter download was reviewed in detail to determine if the current pump settings are providing the best glycemic control without excessive hypoglycemia.  Pump  and meter download:     Pump   Omnipod 5 Settings   Insulin type   Novolog    Basal rate       0000 0.35 u/h    0800 0.40          I:C ratio       0000 1:20   0500 16              Sensitivity       0000  50      Goal       0000  110             Type & Model of Pump: Omnipod 5 Insulin Type: Currently using Novolog .  PUMP STATISTICS: Average BG: 306 Average Daily Carbs (g): 22.1 Average Total Daily Insulin:18 Average Daily Basal: 9.3 (52%) Average Daily Bolus: 8.6(48%)  Over-rides 55 % of boluses    CONTINUOUS GLUCOSE MONITORING RECORD INTERPRETATION    Dates of Recording: 11/25-12/08/2021  Sensor description: dexcom  Results statistics:   CGM use % of time 83.8  Average and SD 306/97  Time in range  15 %  % Time Above 180 16  %  Time above 250 69  % Time Below target  0    Glycemic patterns summary: Hyperglycemia noted during the day and night   Hyperglycemic episodes  day and night   Hypoglycemic episodes occurred  after a bolus  Overnight periods: remains high        HISTORY:  Past Medical History:  Past Medical History:  Diagnosis Date   chronic vaginal bleeding    Diabetes mellitus    type 2   DKA (diabetic ketoacidoses)    History of Elevated LDL cholesterol level    Obesity    Past Surgical History:  Past Surgical History:  Procedure Laterality Date   CESAREAN SECTION     x3   COLONOSCOPY  2018   DILITATION & CURRETTAGE/HYSTROSCOPY WITH NOVASURE ABLATION N/A 07/03/2018   Procedure: DILATATION & CURETTAGE/HYSTEROSCOPY WITH Possible NOVASURE ABLATION;  Surgeon: Lori Carr, Lori Carr;  Location: [facility];  Service: Gynecology;  Laterality: N/A;   LAPAROSCOPIC GASTRIC RESTRICTIVE DUODENAL PROCEDURE (DUODENAL SWITCH) N/A 07/11/2019   tissue removed from stomach  as child   TYMPANOSTOMY TUBE PLACEMENT  as child   WRIST SURGERY Right 03/29/2019   Social History:  reports that she has never smoked. She has  never used smokeless tobacco. She reports that she does not drink alcohol and does not use drugs. Family History:  Family History  Problem Relation Age of Onset   Ovarian cancer Mother 59       deceased age 29 stage IV ovarian cancer   Breast cancer Maternal Aunt 22   Breast cancer Paternal Grandmother 14   Breast cancer Paternal Aunt 83   Stroke Brother    Diabetes Mellitus I Brother      HOME MEDICATIONS: Allergies as of 01/07/2022       Reactions   Amoxicillin-pot Clavulanate Itching   Did it involve swelling of the face/tongue/throat, SOB, or low BP? No Did it involve sudden or severe rash/hives, skin peeling, or any reaction on the inside of your mouth or nose? No Did you need to seek medical attention at a hospital or doctor's office? No When did it last happen?  A long time ago     If all above answers are "NO", may proceed with cephalosporin use.   Vicodin [hydrocodone-acetaminophen] Nausea Only        Medication List        Accurate as of January 07, 2022  3:27 PM. If you have any questions, ask your nurse or doctor.          STOP taking these medications    clindamycin 150 MG capsule Commonly known as: Cleocin Stopped by: Lori Sciara, Lori Carr       TAKE these medications    Dexcom G6 Sensor Misc Inject 1 Device into the skin as directed. Apply one sensor once every 10 days.   Dexcom G6 Transmitter Misc Change every 90 day   fluorouracil 5 % cream Commonly known as: EFUDEX Apply topically 2 (two) times daily.   insulin lispro 100 UNIT/ML injection Commonly known as: HumaLOG Max daily 30 units   Insulin Pen Needle 32G X 4 MM Misc 1 Device by Does not apply route in the morning, at noon, in the evening, and at bedtime.   UltiCare Mini Pen Needles 31G X 6 MM Misc Generic drug: Insulin Pen Needle USE AS DIRECTED FOUR TIMES DAILY   Lancets Misc Use as instructed   lidocaine 5 % Commonly known as: Lidoderm Place 1 patch onto  the skin  daily. Remove & Discard patch within 12 hours or as directed by Lori Carr   lisinopril 10 MG tablet Commonly known as: ZESTRIL Take 1 tablet (10 mg total) by mouth daily.   naproxen 500 MG tablet Commonly known as: Naprosyn Take 1 tablet (500 mg total) by mouth 2 (two) times daily with a meal.   neomycin-polymyxin-hydrocortisone OTIC solution Commonly known as: CORTISPORIN Place 4 drops into both ears 4 (four) times daily.   Omnipod 5 G6 Pod (Gen 5) Misc APPLY 1 POD AS DIRECTED AND CHANGE EVERY 72 HOURS   ondansetron 4 MG disintegrating tablet Commonly known as: ZOFRAN-ODT Take 4 mg by mouth every 8 (eight) hours as needed.   OneTouch Verio test strip Generic drug: glucose blood Use as instructed   pantoprazole 40 MG tablet Commonly known as: PROTONIX Take 1 tablet by mouth daily.   potassium chloride SA 20 MEQ tablet Commonly known as: KLOR-CON M Take 1 tablet (20 mEq total) by mouth daily.         OBJECTIVE:   Vital Signs: BP 132/80 (BP Location: Left Arm, Patient Position: Sitting, Cuff Size: Small)   Pulse 75   Ht 5\' 1"  (1.549 m)   Wt 119 lb (54 kg)   SpO2 95%   BMI 22.48 kg/m   Wt Readings from Last 3 Encounters:  01/07/22 119 lb (54 kg)  10/21/21 112 lb (50.8 kg)  05/28/21 112 lb (50.8 kg)     Exam: General: Pt appears well and is in NAD  Lungs: Clear with good BS bilat   Heart: RRR   Abdomen: Normoactive bowel sounds, soft, nontender, incisions clean  Extremities: NO edema  pretibial edema. .  Neuro: MS is good with appropriate affect, pt is alert and Ox3    DM foot exam: 05/28/2021   The skin of the feet is intact without sores or ulcerations. The pedal pulses are 2+ on right and 2+ on left. The sensation is intact to a screening 5.07, 10 gram monofilament bilaterally   DATA REVIEWED:  Lab Results  Component Value Date   HGBA1C 8.7 (H) 05/21/2021   HGBA1C 7.9 (A) 01/21/2021   HGBA1C 8.8 (H) 05/11/2020   Lab Results  Component Value Date    LDLCALC 63 05/21/2021   CREATININE 0.72 06/24/2021    Lab Results  Component Value Date   CHOL 144 05/21/2021   HDL 68 05/21/2021   LDLCALC 63 05/21/2021   TRIG 62 05/21/2021   CHOLHDL 2.1 05/21/2021         ASSESSMENT / PLAN / RECOMMENDATIONS:   1) Type 1 Diabetes Mellitus, Poorly controlled, Without complications - Most recent A1c of 9.5%. Goal A1c < 7.0 %.  -Patient with worsening glycemic control -Unfortunately she is not bolusing properly, as she does not enter carbohydrates on regular basis, it also appears that she underestimates carbohydrates consumed which results in hypoglycemia -She also tends to override the pump -I was unable to make changes to her pump today as the PDM was completely off, I have asked her to go home plug the PDM and call [facility] if is not working -I have printed out the changes needed to be made and to contact [employer] to help her through the changes -GAD-65 was elevated at  137 IU/mL  -I will increase her basal rate and adjust her sensitivity factor -No changes to insulin to carb ratio at this time  MEDICATIONS: -NovoLog    Pump   Omnipod 5 Settings  Insulin type   Novolog    Basal rate       0000 0.40 u/h    0800 0.45          I:C ratio       0000 1:20   0500 16              Sensitivity       0000  45      Goal       0000  110          EDUCATION / INSTRUCTIONS: BG monitoring instructions: Patient is instructed to check her blood sugars 4 times a day, before meals and bedtime. Call [facility] if: BG persistently < 70  I reviewed the Rule of 15 for the treatment of hypoglycemia in detail with the patient. Literature supplied.     F/U in 4 months    Signed electronically by: Lyndle Herrlich, Lori Carr  [facility] End[facility]  [facility] [address] [place] [place], [place] [address] Phone: [phone] FAX: [phone]   CC: Wells Guiles [address] [place] [address] Phone: [phone]  Fax: [phone]  Return to Endocrinology clinic as below: Future Appointments  Date Time Provider Department Center  01/13/2022  2:00 PM [facility]

## 2022-01-07 NOTE — Patient Instructions (Addendum)
   Pump   Omnipod 5 Settings   Insulin type   Novolog    Basal rate       0000 0.40 u/h    0800 0.45          I:C ratio       0000 1:20   0500 16              Sensitivity  (correction factor)      0000  45

## 2022-01-10 ENCOUNTER — Encounter: Payer: Self-pay | Admitting: Internal Medicine

## 2022-01-10 LAB — POCT GLYCOSYLATED HEMOGLOBIN (HGB A1C): Hemoglobin A1C: 9.5 % — AB (ref 4.0–5.6)

## 2022-01-13 ENCOUNTER — Inpatient Hospital Stay: Admission: RE | Admit: 2022-01-13 | Payer: 59 | Source: Ambulatory Visit

## 2022-01-18 ENCOUNTER — Ambulatory Visit: Payer: 59 | Admitting: Internal Medicine

## 2022-01-18 ENCOUNTER — Other Ambulatory Visit (HOSPITAL_COMMUNITY): Payer: Self-pay

## 2022-01-18 ENCOUNTER — Ambulatory Visit
Admission: RE | Admit: 2022-01-18 | Discharge: 2022-01-18 | Disposition: A | Discharge status: Home / Self Care | Payer: 59 | Source: Ambulatory Visit | Attending: Obstetrics and Gynecology | Admitting: Obstetrics and Gynecology

## 2022-01-18 DIAGNOSIS — N951 Menopausal and female climacteric states: Secondary | ICD-10-CM | POA: Diagnosis not present

## 2022-01-18 DIAGNOSIS — M810 Age-related osteoporosis without current pathological fracture: Secondary | ICD-10-CM | POA: Diagnosis not present

## 2022-01-19 DIAGNOSIS — M810 Age-related osteoporosis without current pathological fracture: Secondary | ICD-10-CM | POA: Insufficient documentation

## 2022-02-18 ENCOUNTER — Other Ambulatory Visit: Payer: Self-pay | Admitting: Physician Assistant

## 2022-02-18 ENCOUNTER — Other Ambulatory Visit: Payer: Self-pay

## 2022-02-18 DIAGNOSIS — Z1152 Encounter for screening for COVID-19: Secondary | ICD-10-CM

## 2022-02-18 LAB — POCT INFLUENZA A/B
Influenza A, POC: POSITIVE — AB
Influenza B, POC: NEGATIVE

## 2022-02-18 LAB — POC COVID19 BINAXNOW: SARS Coronavirus 2 Ag: NEGATIVE

## 2022-02-18 MED ORDER — IBUPROFEN 800 MG PO TABS: 800.0000 mg | ORAL_TABLET | Freq: Three times a day (TID) | ORAL | 0 refills | Status: DC | PRN

## 2022-02-18 MED ORDER — OSELTAMIVIR PHOSPHATE 75 MG PO CAPS: 75.0000 mg | ORAL_CAPSULE | Freq: Two times a day (BID) | ORAL | 0 refills | Status: DC

## 2022-02-18 MED ORDER — PROMETHAZINE-DM 6.25-15 MG/5ML PO SYRP: 5.0000 mL | ORAL_SOLUTION | Freq: Four times a day (QID) | ORAL | 0 refills | Status: DC | PRN

## 2022-02-18 NOTE — Progress Notes (Signed)
Pt presents today for covid & flu testing due to fever, sore throat and drainage for the past couple of days.  FLU: Positive A  COVID: Negative

## 2022-02-23 ENCOUNTER — Other Ambulatory Visit: Payer: Self-pay | Admitting: Physician Assistant

## 2022-02-23 MED ORDER — HYDROCODONE BIT-HOMATROP MBR 5-1.5 MG/5ML PO SOLN: 5.0000 mL | Freq: Three times a day (TID) | ORAL | 0 refills | Status: DC | PRN

## 2022-02-23 MED ORDER — ONDANSETRON HCL 8 MG PO TABS: 8.0000 mg | ORAL_TABLET | Freq: Three times a day (TID) | ORAL | 0 refills | Status: DC | PRN

## 2022-02-23 MED ORDER — GUAIFENESIN-CODEINE 100-10 MG/5ML PO SOLN: 5.0000 mL | ORAL | 0 refills | Status: DC | PRN

## 2022-03-05 ENCOUNTER — Other Ambulatory Visit: Payer: Self-pay | Admitting: Internal Medicine

## 2022-04-19 ENCOUNTER — Other Ambulatory Visit: Payer: Self-pay

## 2022-04-19 MED ORDER — DEXCOM G6 TRANSMITTER MISC: 3 refills | Status: AC

## 2022-04-22 ENCOUNTER — Ambulatory Visit: Payer: Self-pay

## 2022-04-22 DIAGNOSIS — Z0000 Encounter for general adult medical examination without abnormal findings: Secondary | ICD-10-CM

## 2022-04-22 LAB — POCT URINALYSIS DIPSTICK
Blood, UA: NEGATIVE
Glucose, UA: POSITIVE — AB
Ketones, UA: NEGATIVE
Nitrite, UA: NEGATIVE
Protein, UA: POSITIVE — AB
Spec Grav, UA: >=1.03 — AB (ref 1.010–1.025)
Urobilinogen, UA: 1 E.U./dL
pH, UA: 6 (ref 5.0–8.0)

## 2022-04-22 NOTE — Addendum Note (Signed)
Addended by: Hazle Coca on: 04/22/2022 03:18 PM   Modules accepted: Orders

## 2022-04-22 NOTE — Progress Notes (Signed)
Pt presents today to complete lab portion of physical. /CL,RMA 

## 2022-04-24 LAB — MICROALBUMIN / CREATININE URINE RATIO
Creatinine, Urine: 172.9 mg/dL
Microalb/Creat Ratio: 13 mg/g creat (ref 0–29)
Microalbumin, Urine: 22.1 ug/mL

## 2022-04-25 ENCOUNTER — Ambulatory Visit: Payer: Self-pay | Admitting: Physician Assistant

## 2022-04-25 ENCOUNTER — Encounter: Payer: Self-pay | Admitting: Physician Assistant

## 2022-04-25 VITALS — BP 168/84 | HR 65 | Temp 96.8°F | Resp 14 | Wt 117.6 lb

## 2022-04-25 DIAGNOSIS — Z794 Long term (current) use of insulin: Secondary | ICD-10-CM

## 2022-04-25 DIAGNOSIS — Z0000 Encounter for general adult medical examination without abnormal findings: Secondary | ICD-10-CM

## 2022-04-25 LAB — POCT URINALYSIS DIPSTICK
Bilirubin, UA: NEGATIVE
Blood, UA: NEGATIVE
Glucose, UA: POSITIVE — AB
Ketones, UA: NEGATIVE
Leukocytes, UA: NEGATIVE
Nitrite, UA: NEGATIVE
Protein, UA: NEGATIVE
Spec Grav, UA: >=1.03 — AB (ref 1.010–1.025)
Urobilinogen, UA: 0.2 E.U./dL
pH, UA: 5.5 (ref 5.0–8.0)

## 2022-04-25 NOTE — Addendum Note (Signed)
Addended by: Aliene Altes on: 04/25/2022 08:52 AM   Modules accepted: Orders

## 2022-04-25 NOTE — Progress Notes (Signed)
Here to see provider for yearly physical with [facility].  Stated she needs to re evaluate all her meds and not aware of what all she is taking when reviewing list.  Stated does not take Lisinopril daily.

## 2022-04-25 NOTE — Progress Notes (Signed)
[facility]  ____________________________________________   None    (approximate)  I have reviewed the triage vital signs and the nursing notes.   HISTORY  Chief Complaint No chief complaint on file.   HPI Lori Carr is a 57 y.o. female who presents for annual physical exam. Appears in good general health, no acute distress. Reports pain on left lower rib area from accident that occurred 2 weeks ago when she slipped and fell against her left side. Also reports not taking blood pressure medication regularly.     Past Medical History:  Diagnosis Date   chronic vaginal bleeding    Diabetes mellitus    type 2   DKA (diabetic ketoacidoses)    History of Elevated LDL cholesterol level    Obesity     Patient Active Problem List   Diagnosis Date Noted   Vitamin E deficiency 03/04/2021   Encounter for counseling 02/05/2021   Closed fracture of proximal phalanx of great toe 08/17/2020   Pain of toe of left foot 08/17/2020   Anxiety as acute reaction to exceptional stress 09/26/2019   Sinus tachycardia 09/26/2019   Status post bariatric surgery 08/06/2019   Pre-op evaluation 05/23/2019   Radial styloid tenosynovitis of right hand 03/27/2019   Gastritis without bleeding 02/11/2019   Sprain of knee 02/11/2019   Hand pain 09/20/2018   Neuropathy 09/03/2018   Type 1 diabetes mellitus with hyperglycemia ([place]) 08/31/2018   Diabetic ketoacidosis ([place]) 06/06/2018   AKI (acute kidney injury) ([place]) 06/06/2018   Postmenopausal bleeding 08/16/2017   BMI 40.0-44.9, adult ([place]) 06/07/2017   Morbid obesity ([place]) 06/07/2017   Snoring 04/27/2017   Type 1 diabetes mellitus without complication ([place]) 99991111   Osteoarthritis of knee 07/22/2014   Dupuytren's contracture of foot 06/05/2013    Past Surgical History:  Procedure Laterality Date   CESAREAN SECTION     x3   COLONOSCOPY  2018   DILITATION & CURRETTAGE/HYSTROSCOPY WITH NOVASURE  ABLATION N/A 07/03/2018   Procedure: DILATATION & CURETTAGE/HYSTEROSCOPY WITH Possible NOVASURE ABLATION;  Surgeon: Brien Few, MD;  Location: [facility];  Service: Gynecology;  Laterality: N/A;   LAPAROSCOPIC GASTRIC RESTRICTIVE DUODENAL PROCEDURE (DUODENAL SWITCH) N/A 07/11/2019   tissue removed from stomach  as child   TYMPANOSTOMY TUBE PLACEMENT  as child   WRIST SURGERY Right 03/29/2019    Prior to Admission medications   Medication Sig Start Date End Date Taking? Authorizing Provider  Continuous Blood Gluc Sensor (DEXCOM G6 SENSOR) MISC APPLY 1 SENSOR TO SKIN EVERY 10 DAYS 03/06/22  Yes Shamleffer, Melanie Crazier, MD  Continuous Blood Gluc Transmit (DEXCOM G6 TRANSMITTER) MISC Change every 90 day 04/19/22  Yes Shamleffer, Melanie Crazier, MD  insulin aspart (NOVOLOG) 100 UNIT/ML injection Inject 5 Units into the skin 3 (three) times daily before meals. Sliding scale   Yes [provider]  Insulin Disposable Pump (OMNIPOD 5 G6 POD, GEN 5,) MISC APPLY 1 POD AS DIRECTED AND CHANGE EVERY 72 HOURS 08/19/21  Yes Shamleffer, Melanie Crazier, MD  lisinopril (ZESTRIL) 10 MG tablet Take 1 tablet (10 mg total) by mouth daily. 11/10/20  Yes Sable Feil, PA-C  ondansetron (ZOFRAN) 8 MG tablet Take 1 tablet (8 mg total) by mouth every 8 (eight) hours as needed for nausea or vomiting. Patient not taking: Reported on 04/25/2022 02/23/22   Sable Feil, PA-C  pantoprazole (PROTONIX) 40 MG tablet Take 1 tablet by mouth daily. 03/23/20  Yes [provider]  fluorouracil (  EFUDEX) 5 % cream Apply topically 2 (two) times daily. Patient not taking: Reported on 04/25/2022 08/11/21   [provider]  glucose blood (ONETOUCH VERIO) test strip Use as instructed Patient not taking: Reported on 04/25/2022 07/30/19   Sable Feil, PA-C  Insulin Pen Needle 32G X 4 MM MISC 1 Device by Does not apply route in the morning, at noon, in the evening, and at bedtime. 07/23/19    Shamleffer, Melanie Crazier, MD  Lancets MISC Use as instructed Patient not taking: Reported on 04/25/2022 07/29/19   Letitia Neri L, PA-C  lidocaine (LIDODERM) 5 % Place 1 patch onto the skin daily. Remove & Discard patch within 12 hours or as directed by MD Patient not taking: Reported on 04/25/2022 05/26/21   Sable Feil, PA-C  potassium chloride SA (KLOR-CON M) 20 MEQ tablet Take 1 tablet (20 mEq total) by mouth daily. Patient not taking: Reported on 04/25/2022 05/26/21   Sable Feil, PA-C  ULTICARE MINI PEN NEEDLES 31G X 6 MM MISC USE AS DIRECTED FOUR TIMES DAILY 05/16/20   Shamleffer, Melanie Crazier, MD    Allergies Amoxicillin-pot clavulanate and Vicodin [hydrocodone-acetaminophen]  Family History  Problem Relation Age of Onset   Ovarian cancer Mother 28       deceased age 21 stage IV ovarian cancer   Breast cancer Maternal Aunt 66   Breast cancer Paternal Grandmother 25   Breast cancer Paternal Aunt 31   Stroke Brother    Diabetes Mellitus I Brother     Social History Social History   Tobacco Use   Smoking status: Never   Smokeless tobacco: Never  Vaping Use   Vaping Use: Never used  Substance Use Topics   Alcohol use: No   Drug use: No    Review of Systems Constitutional: No fever/chills Eyes: No visual changes. ENT: No sore throat. Cardiovascular: Denies chest pain. Respiratory: Denies shortness of breath. Gastrointestinal: No abdominal pain.  No nausea, no vomiting.  No diarrhea.  No constipation. Genitourinary: Negative for dysuria. Musculoskeletal: Left lower rib pain. Skin: Negative for rash. Neurological: Negative for headaches, focal weakness or numbness. Endocrine: Diabetes and hypertension Hematological/Lymphatic:  Allergic/Immunilogical: Amoxicillin and Vicodin  ____________________________________________   PHYSICAL EXAM:  VITAL SIGNS: BP 168/84, P 65, T 96.52f, Wt 117lb, BMI 22 Constitutional: Alert and oriented. Well appearing and in  no acute distress. Eyes: Conjunctivae are normal. PERRL. EOMI. Head: Atraumatic. Nose: No congestion/rhinnorhea. Mouth/Throat: Mucous membranes are moist.  Oropharynx non-erythematous. Neck: No stridor. No cervical spine tenderness to palpation Hematological/Lymphatic/Immunilogical: No cervical lymphadenopathy. Cardiovascular: Normal rate, regular rhythm. Grossly normal heart sounds.  Good peripheral circulation. Respiratory: Normal respiratory effort.  No retractions. Lungs CTAB. Gastrointestinal: Soft and nontender. No distention. No abdominal bruits. No CVA tenderness. Genitourinary:  Musculoskeletal: Tenderness to light palpation on left lower rib area Neurologic:  Normal speech and language. No gross focal neurologic deficits are appreciated. No gait instability. Skin:  Skin is warm, dry and intact. No rash noted. Psychiatric: Mood and affect are normal. Speech and behavior are normal.  ____________________________________________   LABS Pending  ____________________________________________  EKG  Sinus rhythm at 70 bpm ____________________________________________    ____________________________________________   INITIAL IMPRESSION / ASSESSMENT AND PLAN  As part of my medical decision making, I reviewed the following data within the electronic MEDICAL RECORD NUMBER   Left lower rib area tender to light palpation without visible bruising or open wound. Plan to obtain xray to rule out fracture. Patient also advised to take  BP medication as prescribed as her in office BP reading elevated.          ____________________________________________   FINAL CLINICAL IMPRESSION Well exam   ED Discharge Orders     None        Note:  This document was prepared using Dragon voice recognition software and may include unintentional dictation errors.

## 2022-04-26 ENCOUNTER — Encounter: Payer: Self-pay | Admitting: Physician Assistant

## 2022-04-26 ENCOUNTER — Ambulatory Visit
Admission: RE | Admit: 2022-04-26 | Discharge: 2022-04-26 | Disposition: A | Discharge status: Home / Self Care | Payer: 59 | Attending: Physician Assistant | Admitting: Physician Assistant

## 2022-04-26 ENCOUNTER — Other Ambulatory Visit: Payer: Self-pay | Admitting: Physician Assistant

## 2022-04-26 ENCOUNTER — Ambulatory Visit
Admission: RE | Admit: 2022-04-26 | Discharge: 2022-04-26 | Disposition: A | Discharge status: Home / Self Care | Payer: 59 | Source: Ambulatory Visit | Attending: Physician Assistant | Admitting: Physician Assistant

## 2022-04-26 ENCOUNTER — Ambulatory Visit: Payer: Self-pay | Admitting: Physician Assistant

## 2022-04-26 DIAGNOSIS — R0781 Pleurodynia: Secondary | ICD-10-CM | POA: Diagnosis not present

## 2022-04-26 DIAGNOSIS — H9203 Otalgia, bilateral: Secondary | ICD-10-CM

## 2022-04-26 MED ORDER — NITROFURANTOIN MONOHYD MACRO 100 MG PO CAPS: 100.0000 mg | ORAL_CAPSULE | Freq: Two times a day (BID) | ORAL | 0 refills | Status: DC

## 2022-04-26 MED ORDER — METHYLPREDNISOLONE 4 MG PO TBPK: ORAL_TABLET | ORAL | 0 refills | Status: DC

## 2022-04-26 NOTE — Progress Notes (Signed)
Pt feeling pain in right ear, and some slight discomfort in the left.

## 2022-04-26 NOTE — Progress Notes (Signed)
   Subjective: Bilateral ear pain    Patient ID: Lori Carr, female    DOB: 1965/10/03, 56 y.o.   MRN: [id number]  HPI Patient presents with right ear pain. States she's been having intermittent dull ear pain for the past month, however pain persisted last night.   Review of Systems HEENT: Denies fever, chills, vertigo or hearing loss.     Objective:   Physical Exam Vital signs not taken No evident signs of infection noted. Mild earwax buildup visibile in bilateral ears. Right ear negative for erythema or edema, TM not bulging & pearly white.        Assessment & Plan: Bilateral ear pain  Plan to take corticosteroid for inflammation & f/u in 2 day for symptom improvement or worsening.

## 2022-04-26 NOTE — Progress Notes (Unsigned)
   Subjective:    Patient ID: Lori Carr, female    DOB: 16-Jul-1965, 57 y.o.   MRN: [id number]  HPI    Review of Systems     Objective:   Physical Exam        Assessment & Plan:

## 2022-04-29 LAB — VITAMIN B1: Thiamine: 152 nmol/L (ref 66.5–200.0)

## 2022-05-01 LAB — CMP12+LP+TP+TSH+6AC+CBC/D/PLT
ALT: 18 IU/L (ref 0–32)
AST: 16 IU/L (ref 0–40)
Albumin/Globulin Ratio: 1.6 (ref 1.2–2.2)
Albumin: 3.7 g/dL — ABNORMAL LOW (ref 3.8–4.9)
Alkaline Phosphatase: 188 IU/L — ABNORMAL HIGH (ref 44–121)
BUN/Creatinine Ratio: 12 (ref 9–23)
BUN: 9 mg/dL (ref 6–24)
Basophils Absolute: 0.1 10*3/uL (ref 0.0–0.2)
Basos: 1 %
Bilirubin Total: 0.9 mg/dL (ref 0.0–1.2)
Calcium: 8.5 mg/dL — ABNORMAL LOW (ref 8.7–10.2)
Chloride: 102 mmol/L (ref 96–106)
Chol/HDL Ratio: 2.2 ratio (ref 0.0–4.4)
Cholesterol, Total: 134 mg/dL (ref 100–199)
Creatinine, Ser: 0.74 mg/dL (ref 0.57–1.00)
EOS (ABSOLUTE): 0.2 10*3/uL (ref 0.0–0.4)
Eos: 3 %
Estimated CHD Risk: <0.5 times avg. (ref 0.0–1.0)
Free Thyroxine Index: 2.1 (ref 1.2–4.9)
GGT: 5 IU/L (ref 0–60)
Globulin, Total: 2.3 g/dL (ref 1.5–4.5)
Glucose: 116 mg/dL — ABNORMAL HIGH (ref 70–99)
HDL: 61 mg/dL (ref >39)
Hematocrit: 44.1 % (ref 34.0–46.6)
Hemoglobin: 14.5 g/dL (ref 11.1–15.9)
Immature Grans (Abs): 0 10*3/uL (ref 0.0–0.1)
Immature Granulocytes: 0 %
Iron: 43 ug/dL (ref 27–159)
LDH: 214 IU/L (ref 119–226)
LDL Chol Calc (NIH): 62 mg/dL (ref 0–99)
Lymphocytes Absolute: 3 10*3/uL (ref 0.7–3.1)
Lymphs: 34 %
MCH: 28.3 pg (ref 26.6–33.0)
MCHC: 32.9 g/dL (ref 31.5–35.7)
MCV: 86 fL (ref 79–97)
Monocytes Absolute: 0.7 10*3/uL (ref 0.1–0.9)
Monocytes: 8 %
Neutrophils Absolute: 4.8 10*3/uL (ref 1.4–7.0)
Neutrophils: 54 %
Phosphorus: 4.6 mg/dL — ABNORMAL HIGH (ref 3.0–4.3)
Platelets: 236 10*3/uL (ref 150–450)
Potassium: 3.6 mmol/L (ref 3.5–5.2)
RBC: 5.13 x10E6/uL (ref 3.77–5.28)
RDW: 13.5 % (ref 11.7–15.4)
Sodium: 142 mmol/L (ref 134–144)
T3 Uptake Ratio: 27 % (ref 24–39)
T4, Total: 7.7 ug/dL (ref 4.5–12.0)
TSH: 2.45 u[IU]/mL (ref 0.450–4.500)
Total Protein: 6 g/dL (ref 6.0–8.5)
Triglycerides: 50 mg/dL (ref 0–149)
Uric Acid: 1.7 mg/dL — ABNORMAL LOW (ref 3.0–7.2)
VLDL Cholesterol Cal: 11 mg/dL (ref 5–40)
WBC: 8.7 10*3/uL (ref 3.4–10.8)
eGFR: 95 mL/min/{1.73_m2} (ref >59)

## 2022-05-01 LAB — VITAMIN D 25 HYDROXY (VIT D DEFICIENCY, FRACTURES): Vit D, 25-Hydroxy: 17.6 ng/mL — ABNORMAL LOW (ref 30.0–100.0)

## 2022-05-01 LAB — HGB A1C W/O EAG: Hgb A1c MFr Bld: 9.6 % — ABNORMAL HIGH (ref 4.8–5.6)

## 2022-05-01 LAB — ZINC: Zinc: 45 ug/dL (ref 44–115)

## 2022-05-01 LAB — VITAMIN E
Vitamin E (Alpha Tocopherol): 6 mg/L — ABNORMAL LOW (ref 7.0–25.1)
Vitamin E(Gamma Tocopherol): 0.5 mg/L (ref 0.5–5.5)

## 2022-05-01 LAB — VITAMIN A: Vitamin A: 19.1 ug/dL — ABNORMAL LOW (ref 20.1–62.0)

## 2022-05-01 LAB — VITAMIN B1

## 2022-05-01 LAB — B12 AND FOLATE PANEL
Folate: 7.4 ng/mL (ref >3.0)
Vitamin B-12: 392 pg/mL (ref 232–1245)

## 2022-05-01 LAB — VITAMIN K1, SERUM: VITAMIN K1: <0.1 ng/mL — ABNORMAL LOW (ref 0.10–2.20)

## 2022-05-02 ENCOUNTER — Other Ambulatory Visit: Payer: Self-pay | Admitting: Physician Assistant

## 2022-05-02 MED ORDER — VITAMIN D (ERGOCALCIFEROL) 1.25 MG (50000 UNIT) PO CAPS: 50000.0000 [IU] | ORAL_CAPSULE | ORAL | 2 refills | Status: DC

## 2022-05-07 ENCOUNTER — Other Ambulatory Visit: Payer: Self-pay | Admitting: Internal Medicine

## 2022-05-12 ENCOUNTER — Ambulatory Visit: Payer: Self-pay | Admitting: Physician Assistant

## 2022-05-12 ENCOUNTER — Encounter: Payer: Self-pay | Admitting: Physician Assistant

## 2022-05-12 VITALS — BP 143/97 | HR 74 | Temp 97.3°F | Resp 14

## 2022-05-12 DIAGNOSIS — H6121 Impacted cerumen, right ear: Secondary | ICD-10-CM

## 2022-05-12 NOTE — Progress Notes (Signed)
C/O intermittent dull ache in right eye reported several weeks that started again yesterday.  Denies and sinusitis or any other complaints.  Stated concerned over hearing loss.  Stated has trouble with ear wax.  Noted ear wax in right ear canal with otoscope.

## 2022-05-12 NOTE — Progress Notes (Deleted)
[facility] ____________________________________________   None    (approximate)  I have reviewed the triage vital signs and the nursing notes.   HISTORY  Chief Complaint No chief complaint on file.   HPI Lori Carr is a 57 y.o. female          Past Medical History:  Diagnosis Date   chronic vaginal bleeding    Diabetes mellitus    type 2   DKA (diabetic ketoacidoses)    History of Elevated LDL cholesterol level    Obesity     Patient Active Problem List   Diagnosis Date Noted   Vitamin E deficiency 03/04/2021   Encounter for counseling 02/05/2021   Closed fracture of proximal phalanx of great toe 08/17/2020   Pain of toe of left foot 08/17/2020   Anxiety as acute reaction to exceptional stress 09/26/2019   Sinus tachycardia 09/26/2019   Status post bariatric surgery 08/06/2019   Pre-op evaluation 05/23/2019   Radial styloid tenosynovitis of right hand 03/27/2019   Gastritis without bleeding 02/11/2019   Sprain of knee 02/11/2019   Hand pain 09/20/2018   Neuropathy 09/03/2018   Type 1 diabetes mellitus with hyperglycemia 08/31/2018   Diabetic ketoacidosis 06/06/2018   AKI (acute kidney injury) 06/06/2018   Postmenopausal bleeding 08/16/2017   BMI 40.0-44.9, adult 06/07/2017   Morbid obesity 06/07/2017   Snoring 04/27/2017   Type 1 diabetes mellitus without complication 07/31/2014   Osteoarthritis of knee 07/22/2014   Dupuytren's contracture of foot 06/05/2013    Past Surgical History:  Procedure Laterality Date   CESAREAN SECTION     x3   COLONOSCOPY  2018   DILITATION & CURRETTAGE/HYSTROSCOPY WITH NOVASURE ABLATION N/A 07/03/2018   Procedure: DILATATION & CURETTAGE/HYSTEROSCOPY WITH Possible NOVASURE ABLATION;  Surgeon: Olivia Mackieaavon, Richard, MD;  Location: [facility];  Service: Gynecology;  Laterality: N/A;   LAPAROSCOPIC GASTRIC RESTRICTIVE DUODENAL PROCEDURE (DUODENAL SWITCH) N/A 07/11/2019    tissue removed from stomach  as child   TYMPANOSTOMY TUBE PLACEMENT  as child   WRIST SURGERY Right 03/29/2019    Prior to Admission medications   Medication Sig Start Date End Date Taking? Authorizing Provider  ondansetron (ZOFRAN) 8 MG tablet Take 1 tablet (8 mg total) by mouth every 8 (eight) hours as needed for nausea or vomiting. Patient not taking: Reported on 04/25/2022 02/23/22   Joni ReiningSmith, Jeremia Groot K, PA-C  Vitamin D, Ergocalciferol, (DRISDOL) 1.25 MG (50000 UNIT) CAPS capsule Take 1 capsule (50,000 Units total) by mouth every 7 (seven) days. 05/02/22   Joni ReiningSmith, Crystale Giannattasio K, PA-C  Continuous Blood Gluc Sensor (DEXCOM G6 SENSOR) MISC APPLY 1 SENSOR TO SKIN EVERY 10 DAYS 03/06/22   Shamleffer, Konrad DoloresIbtehal Jaralla, MD  Continuous Blood Gluc Transmit (DEXCOM G6 TRANSMITTER) MISC Change every 90 day 04/19/22   Shamleffer, Konrad DoloresIbtehal Jaralla, MD  fluorouracil (EFUDEX) 5 % cream Apply topically 2 (two) times daily. Patient not taking: Reported on 04/25/2022 08/11/21   [provider]  glucose blood (ONETOUCH VERIO) test strip Use as instructed Patient not taking: Reported on 04/25/2022 07/30/19   Joni ReiningSmith, Mikka Kissner K, PA-C  insulin aspart (NOVOLOG) 100 UNIT/ML injection USE AS DIRECTED (20 UNITS MAX DAILY) AS NEEDED 05/08/22   Shamleffer, Konrad DoloresIbtehal Jaralla, MD  Insulin Disposable Pump (OMNIPOD 5 G6 POD, GEN 5,) MISC APPLY 1 POD AS DIRECTED AND CHANGE EVERY 72 HOURS 08/19/21   Shamleffer, Konrad DoloresIbtehal Jaralla, MD  Insulin Pen Needle 32G X 4 MM MISC 1 Device by Does not apply  route in the morning, at noon, in the evening, and at bedtime. 07/23/19   Shamleffer, Konrad Dolores, MD  Lancets MISC Use as instructed Patient not taking: Reported on 04/25/2022 07/29/19   Bridget Hartshorn L, PA-C  lidocaine (LIDODERM) 5 % Place 1 patch onto the skin daily. Remove & Discard patch within 12 hours or as directed by MD Patient not taking: Reported on 04/25/2022 05/26/21   Joni Reining, PA-C  lisinopril (ZESTRIL) 10 MG tablet Take 1 tablet (10  mg total) by mouth daily. 11/10/20   Joni Reining, PA-C  methylPREDNISolone (MEDROL DOSEPAK) 4 MG TBPK tablet Take Tapered dose as directed 04/26/22   Joni Reining, PA-C  nitrofurantoin, macrocrystal-monohydrate, (MACROBID) 100 MG capsule Take 1 capsule (100 mg total) by mouth 2 (two) times daily. 04/26/22   Joni Reining, PA-C  pantoprazole (PROTONIX) 40 MG tablet Take 1 tablet by mouth daily. 03/23/20   [provider]  potassium chloride SA (KLOR-CON M) 20 MEQ tablet Take 1 tablet (20 mEq total) by mouth daily. Patient not taking: Reported on 04/25/2022 05/26/21   Joni Reining, PA-C  ULTICARE MINI PEN NEEDLES 31G X 6 MM MISC USE AS DIRECTED FOUR TIMES DAILY 05/16/20   Shamleffer, Konrad Dolores, MD    Allergies Amoxicillin-pot clavulanate and Vicodin [hydrocodone-acetaminophen]  Family History  Problem Relation Age of Onset   Ovarian cancer Mother 36       deceased age 68 stage IV ovarian cancer   Breast cancer Maternal Aunt 78   Breast cancer Paternal Grandmother 32   Breast cancer Paternal Aunt 73   Stroke Brother    Diabetes Mellitus I Brother     Social History Social History   Tobacco Use   Smoking status: Never   Smokeless tobacco: Never  Vaping Use   Vaping Use: Never used  Substance Use Topics   Alcohol use: No   Drug use: No    Review of Systems Constitutional: No fever/chills Eyes: No visual changes. ENT: No sore throat. Cardiovascular: Denies chest pain. Respiratory: Denies shortness of breath. Gastrointestinal: No abdominal pain.  No nausea, no vomiting.  No diarrhea.  No constipation. Genitourinary: Negative for dysuria. Musculoskeletal: Negative for back pain. Skin: Negative for rash. Neurological: Negative for headaches, focal weakness or numbness. Psychiatric: Anxiety Endocrine: Diabetes and hypertension Allergic/Immunilogical: Amoxicillin hydrocodone  ____________________________________________   PHYSICAL EXAM:  VITAL  SIGNS: Constitutional: Alert and oriented. Well appearing and in no acute distress. Eyes: Conjunctivae are normal. PERRL. EOMI. Head: Atraumatic. Nose: No congestion/rhinnorhea. Mouth/Throat: Mucous membranes are moist.  Oropharynx non-erythematous. Neck: No stridor.  {**No cervical spine tenderness to palpation.**} {**Hematological/Lymphatic/Immunilogical: No cervical lymphadenopathy. **}Cardiovascular: Normal rate, regular rhythm. Grossly normal heart sounds.  Good peripheral circulation. Respiratory: Normal respiratory effort.  No retractions. Lungs CTAB. Gastrointestinal: Soft and nontender. No distention. No abdominal bruits. No CVA tenderness. {**Genitourinary:  **}Musculoskeletal: No lower extremity tenderness nor edema.  No joint effusions. Neurologic:  Normal speech and language. No gross focal neurologic deficits are appreciated. No gait instability. Skin:  Skin is warm, dry and intact. No rash noted. Psychiatric: Mood and affect are normal. Speech and behavior are normal.  ____________________________________________   LABS  ____________________________________________  EKG Sinus rhythm at 70 bpm  ____________________________________________    ____________________________________________   INITIAL IMPRESSION / ASSESSMENT AND PLAN / ED COURSE  As part of my medical decision making, I reviewed the following data within the electronic MEDICAL RECORD NUMBER {[id number]::"Notes from prior ED visits","[place] Controlled Substance Database"}        ***  @  YPPJKDTO@   ____________________________________________   FINAL CLINICAL IMPRESSION(S) / ED DIAGNOSES  @[facility]CI @   ED Discharge Orders     None        Note:  This document was prepared using Dragon voice recognition software and may include unintentional dictation errors.

## 2022-05-12 NOTE — Progress Notes (Signed)
   Subjective: Right ear pain and hearing loss.    Patient ID: Lori Carr, female    DOB: 06/13/1965, 57 y.o.   MRN: [id number]  HPI Patient reports concern for suspected hearing loss for the past several weeks.  Patient states yesterday she developed pain in her right ear.  Patient denies URI signs and symptoms.  Patient denies vertigo.   Review of Systems Anxiety, diabetes, and vitamin D deficiency.    Objective:    BP is 143/97, pulse 74, respiration 14, temperature 97.3, and patient 97% O2 sat on room air. HEENT is remarkable for dry cerumen right ear.      Assessment & Plan: Cerumen impaction and subjective hearing loss   Patient advised to use earwax softener as scheduled for hearing test in the next 4 days.

## 2022-05-17 ENCOUNTER — Ambulatory Visit: Payer: Self-pay

## 2022-05-17 DIAGNOSIS — H6121 Impacted cerumen, right ear: Secondary | ICD-10-CM

## 2022-05-17 NOTE — Progress Notes (Signed)
Pt presents today to have both ears cleared from wax. Pt tolerated well. Both ears are clean.

## 2022-05-20 ENCOUNTER — Ambulatory Visit: Payer: 59

## 2022-05-20 DIAGNOSIS — Z0110 Encounter for examination of ears and hearing without abnormal findings: Secondary | ICD-10-CM

## 2022-05-20 NOTE — Progress Notes (Signed)
C/O difficulty hearing in right ear.  Recently treated for Rt Ear infection.  AMD

## 2022-05-23 DIAGNOSIS — I10 Essential (primary) hypertension: Secondary | ICD-10-CM | POA: Diagnosis not present

## 2022-05-23 DIAGNOSIS — E1065 Type 1 diabetes mellitus with hyperglycemia: Secondary | ICD-10-CM | POA: Diagnosis not present

## 2022-05-26 DIAGNOSIS — R748 Abnormal levels of other serum enzymes: Secondary | ICD-10-CM | POA: Diagnosis not present

## 2022-05-26 DIAGNOSIS — Z83719 Family history of colon polyps, unspecified: Secondary | ICD-10-CM | POA: Diagnosis not present

## 2022-05-30 ENCOUNTER — Other Ambulatory Visit: Payer: Self-pay | Admitting: Gastroenterology

## 2022-05-30 DIAGNOSIS — R748 Abnormal levels of other serum enzymes: Secondary | ICD-10-CM

## 2022-06-06 ENCOUNTER — Ambulatory Visit
Admission: RE | Admit: 2022-06-06 | Discharge: 2022-06-06 | Disposition: A | Discharge status: Home / Self Care | Payer: 59 | Attending: Physician Assistant | Admitting: Physician Assistant

## 2022-06-06 ENCOUNTER — Ambulatory Visit: Payer: 59 | Admitting: Internal Medicine

## 2022-06-06 ENCOUNTER — Encounter: Payer: Self-pay | Admitting: Physician Assistant

## 2022-06-06 ENCOUNTER — Ambulatory Visit: Payer: Self-pay | Admitting: Physician Assistant

## 2022-06-06 ENCOUNTER — Ambulatory Visit
Admission: RE | Admit: 2022-06-06 | Discharge: 2022-06-06 | Disposition: A | Discharge status: Home / Self Care | Payer: 59 | Source: Ambulatory Visit | Attending: Physician Assistant | Admitting: Physician Assistant

## 2022-06-06 VITALS — BP 165/98 | HR 78 | Temp 97.5°F | Resp 12

## 2022-06-06 DIAGNOSIS — R0781 Pleurodynia: Secondary | ICD-10-CM | POA: Insufficient documentation

## 2022-06-06 DIAGNOSIS — Z9884 Bariatric surgery status: Secondary | ICD-10-CM | POA: Insufficient documentation

## 2022-06-06 DIAGNOSIS — M84374A Stress fracture, right foot, initial encounter for fracture: Secondary | ICD-10-CM | POA: Insufficient documentation

## 2022-06-06 MED ORDER — NAPROXEN 500 MG PO TABS
500.0000 mg | ORAL_TABLET | Freq: Two times a day (BID) | ORAL | Status: DC
Start: 2022-06-06 — End: 2022-07-21

## 2022-06-06 MED ORDER — TRAMADOL HCL 50 MG PO TABS: 50.0000 mg | ORAL_TABLET | Freq: Three times a day (TID) | ORAL | 0 refills | Status: AC | PRN

## 2022-06-06 NOTE — Progress Notes (Signed)
Right side rib pain - Started last night after reaching for something on the console.  Constant - tender to the touch & can't take a deep breath.  Took some tylenol or ibuprofen - was able to go to sleep.  Hurts to hiccup or sneeze  AMD

## 2022-06-06 NOTE — Progress Notes (Signed)
   Subjective: Right rib pain    Patient ID: Lori Carr, female    DOB: Oct 21, 1965, 57 y.o.   MRN: [id number]  HPI Patient complains of right rib pain secondary to a contusion car consult last night.  Patient states pain with palpation, deep breath, and overhead reaching.  Patient has a history of osteoporosis and osteopenia.  Patient is noncompliant with medication for these conditions.   Review of Systems Diabetes    Objective:   Physical Exam BP 165/98  BP Location Left Arm  Patient Position Sitting  Cuff Size Normal  Pulse 78  Resp 12  Temp 97.5 F (36.4 C)  Temp src Temporal  SpO2 97 %     Deformity of the right lateral chest wall.  No ecchymosis, edema, or erythema.  Patient has moderate guarding palpation 8 through 10 if right lateral ribs.  No step-off.  Lungs clear to auscultation.      Assessment & Plan: Rib contusion versus fracture.  Patient sent for x-rays.  Patient given a prescription for tramadol and naproxen.  Patient advised use Lidoderm patches from previous prescription.  Patient will follow-up if no improvement in 3 to 5 days.

## 2022-06-07 ENCOUNTER — Other Ambulatory Visit: Payer: 59

## 2022-06-14 ENCOUNTER — Encounter
Admission: RE | Disposition: A | Discharge status: Home / Self Care | Payer: Self-pay | Source: Home / Self Care | Attending: Gastroenterology

## 2022-06-14 ENCOUNTER — Encounter: Payer: Self-pay | Admitting: *Deleted

## 2022-06-14 ENCOUNTER — Ambulatory Visit
Admission: RE | Admit: 2022-06-14 | Discharge: 2022-06-14 | Disposition: A | Discharge status: Home / Self Care | Payer: 59 | Attending: Gastroenterology | Admitting: Gastroenterology

## 2022-06-14 ENCOUNTER — Other Ambulatory Visit: Payer: Self-pay

## 2022-06-14 ENCOUNTER — Ambulatory Visit: Payer: 59 | Admitting: Anesthesiology

## 2022-06-14 DIAGNOSIS — Z1211 Encounter for screening for malignant neoplasm of colon: Secondary | ICD-10-CM | POA: Diagnosis not present

## 2022-06-14 DIAGNOSIS — F419 Anxiety disorder, unspecified: Secondary | ICD-10-CM | POA: Insufficient documentation

## 2022-06-14 DIAGNOSIS — Z83719 Family history of colon polyps, unspecified: Secondary | ICD-10-CM | POA: Insufficient documentation

## 2022-06-14 DIAGNOSIS — E119 Type 2 diabetes mellitus without complications: Secondary | ICD-10-CM | POA: Diagnosis not present

## 2022-06-14 DIAGNOSIS — Z9884 Bariatric surgery status: Secondary | ICD-10-CM | POA: Diagnosis not present

## 2022-06-14 DIAGNOSIS — K640 First degree hemorrhoids: Secondary | ICD-10-CM | POA: Diagnosis not present

## 2022-06-14 HISTORY — PX: COLONOSCOPY WITH PROPOFOL: SHX5780

## 2022-06-14 LAB — GLUCOSE, CAPILLARY: Glucose-Capillary: 182 mg/dL — ABNORMAL HIGH (ref 70–99)

## 2022-06-14 SURGERY — COLONOSCOPY WITH PROPOFOL
Anesthesia: General

## 2022-06-14 MED ORDER — PROPOFOL 10 MG/ML IV BOLUS
INTRAVENOUS | Status: DC | PRN
  Administered 2022-06-14: 50 mg via INTRAVENOUS

## 2022-06-14 MED ORDER — PROPOFOL 500 MG/50ML IV EMUL
INTRAVENOUS | Status: DC | PRN
  Administered 2022-06-14: 100 ug/kg/min via INTRAVENOUS

## 2022-06-14 MED ORDER — SODIUM CHLORIDE 0.9 % IV SOLN: INTRAVENOUS | Status: DC

## 2022-06-14 MED ORDER — LIDOCAINE HCL (PF) 2 % IJ SOLN
INTRAMUSCULAR | Status: AC
  Filled 2022-06-14: qty 10

## 2022-06-14 MED ORDER — DEXMEDETOMIDINE HCL IN NACL 400 MCG/100ML IV SOLN
INTRAVENOUS | Status: DC | PRN
  Administered 2022-06-14 (×2): 4 ug via INTRAVENOUS

## 2022-06-14 MED ORDER — PROPOFOL 10 MG/ML IV BOLUS
INTRAVENOUS | Status: AC
  Filled 2022-06-14: qty 20

## 2022-06-14 MED ORDER — LIDOCAINE HCL (CARDIAC) PF 100 MG/5ML IV SOSY
PREFILLED_SYRINGE | INTRAVENOUS | Status: DC | PRN
  Administered 2022-06-14: 60 mg via INTRAVENOUS

## 2022-06-14 NOTE — Transfer of Care (Signed)
Immediate Anesthesia Transfer of Care Note  Patient: Lori Carr  Procedure(s) Performed: COLONOSCOPY WITH PROPOFOL  Patient Location: PACU  Anesthesia Type:General  Level of Consciousness: sedated  Airway & Oxygen Therapy: Patient Spontanous Breathing  Post-op Assessment: Report given to RN and Post -op Vital signs reviewed and stable  Post vital signs: Reviewed and stable  Last Vitals:  Vitals Value Taken Time  BP 111/70 06/14/22 1342  Temp    Pulse 77 06/14/22 1342  Resp 11 06/14/22 1342  SpO2 96 % 06/14/22 1342    Last Pain:  Vitals:   06/14/22 1342  TempSrc:   PainSc: Asleep         Complications: No notable events documented.

## 2022-06-14 NOTE — Anesthesia Preprocedure Evaluation (Addendum)
Anesthesia Evaluation  Patient identified by MRN, date of birth, ID band Patient awake    Reviewed: Allergy & Precautions, NPO status , Patient's Chart, lab work & pertinent test results  History of Anesthesia Complications Negative for: history of anesthetic complications  Airway Mallampati: II  TM Distance: >3 FB Neck ROM: full    Dental no notable dental hx.    Pulmonary neg pulmonary ROS   Pulmonary exam normal        Cardiovascular negative cardio ROS Normal cardiovascular exam     Neuro/Psych  PSYCHIATRIC DISORDERS Anxiety     negative neurological ROS     GI/Hepatic negative GI ROS, Neg liver ROS,,,  Endo/Other  diabetes    Renal/GU negative Renal ROS  negative genitourinary   Musculoskeletal   Abdominal   Peds  Hematology negative hematology ROS (+)   Anesthesia Other Findings Past Medical History: No date: chronic vaginal bleeding No date: Diabetes mellitus     Comment:  type 2 No date: DKA (diabetic ketoacidoses) No date: History of Elevated LDL cholesterol level No date: Obesity  Past Surgical History: No date: CESAREAN SECTION     Comment:  x3 2018: COLONOSCOPY 07/03/2018: DILITATION & CURRETTAGE/HYSTROSCOPY WITH NOVASURE  ABLATION; N/A     Comment:  Procedure: DILATATION & CURETTAGE/HYSTEROSCOPY WITH               Possible NOVASURE ABLATION;  Surgeon: Olivia Mackie,               MD;  Location: [facility];  Service:               Gynecology;  Laterality: N/A; 07/11/2019: LAPAROSCOPIC GASTRIC RESTRICTIVE DUODENAL PROCEDURE  (DUODENAL SWITCH); N/A as child: tissue removed from stomach as child: TYMPANOSTOMY TUBE PLACEMENT 03/29/2019: WRIST SURGERY; Right     Reproductive/Obstetrics negative OB ROS                             Anesthesia Physical Anesthesia Plan  ASA: 2  Anesthesia Plan: General   Post-op Pain Management:    Induction:  Intravenous  PONV Risk Score and Plan: Propofol infusion and TIVA  Airway Management Planned: Natural Airway and Nasal Cannula  Additional Equipment:   Intra-op Plan:   Post-operative Plan:   Informed Consent: I have reviewed the patients History and Physical, chart, labs and discussed the procedure including the risks, benefits and alternatives for the proposed anesthesia with the patient or authorized representative who has indicated his/her understanding and acceptance.     Dental Advisory Given  Plan Discussed with: Anesthesiologist, CRNA and Surgeon  Anesthesia Plan Comments: (Patient consented for risks of anesthesia including but not limited to:  - adverse reactions to medications - risk of airway placement if required - damage to eyes, teeth, lips or other oral mucosa - nerve damage due to positioning  - sore throat or hoarseness - Damage to heart, brain, nerves, lungs, other parts of body or loss of life  Patient voiced understanding.)        Anesthesia Quick Evaluation

## 2022-06-14 NOTE — Op Note (Signed)
[facility] Patient Name: Lori Carr Procedure Date: 06/14/2022 1:00 PM MRN: [id number] Account #: [id number] Date of Birth: 12/11/1965 Admit Type: Outpatient Age: 57 Room: [facility] ENDO ROOM 1 Gender: Female Note Status: Finalized Instrument Name: Prentice Docker [id number] Procedure:             Colonoscopy Indications:           Colon cancer screening in patient at increased risk:                         Family history of 1st-degree relative with colon polyps Providers:             Eather Colas MD, MD Referring MD:          Joni Reining, MD (Referring MD) Medicines:             Monitored Anesthesia Care Complications:         No immediate complications. Procedure:             Pre-Anesthesia Assessment:                        - Prior to the procedure, a History and Physical was                         performed, and patient medications and allergies were                         reviewed. The patient is competent. The risks and                         benefits of the procedure and the sedation options and                         risks were discussed with the patient. All questions                         were answered and informed consent was obtained.                         Patient identification and proposed procedure were                         verified by the physician, the nurse, the                         anesthesiologist, the anesthetist and the technician                         in the endoscopy suite. Mental Status Examination:                         alert and oriented. Airway Examination: normal                         oropharyngeal airway and neck mobility. Respiratory                         Examination: clear to auscultation. CV Examination:  normal. Prophylactic Antibiotics: The patient does not                         require prophylactic antibiotics. Prior                         Anticoagulants: The patient  has taken no anticoagulant                         or antiplatelet agents. ASA Grade Assessment: II - A                         patient with mild systemic disease. After reviewing                         the risks and benefits, the patient was deemed in                         satisfactory condition to undergo the procedure. The                         anesthesia plan was to use monitored anesthesia care                         (MAC). Immediately prior to administration of                         medications, the patient was re-assessed for adequacy                         to receive sedatives. The heart rate, respiratory                         rate, oxygen saturations, blood pressure, adequacy of                         pulmonary ventilation, and response to care were                         monitored throughout the procedure. The physical                         status of the patient was re-assessed after the                         procedure.                        After obtaining informed consent, the colonoscope was                         passed under direct vision. Throughout the procedure,                         the patient's blood pressure, pulse, and oxygen                         saturations were monitored continuously. The  Colonoscope was introduced through the anus and                         advanced to the the cecum, identified by appendiceal                         orifice and ileocecal valve. The colonoscopy was                         technically difficult and complex due to a redundant                         colon and significant looping. Successful completion                         of the procedure was aided by applying abdominal                         pressure. The patient tolerated the procedure well.                         The quality of the bowel preparation was adequate to                         identify polyps. The ileocecal valve,  appendiceal                         orifice, and rectum were photographed. Findings:      The perianal and digital rectal examinations were normal.      Internal hemorrhoids were found during retroflexion. The hemorrhoids       were Grade I (internal hemorrhoids that do not prolapse).      The exam was otherwise without abnormality on direct and retroflexion       views. Impression:            - Internal hemorrhoids.                        - The examination was otherwise normal on direct and                         retroflexion views.                        - No specimens collected. Recommendation:        - Discharge patient to home.                        - Resume previous diet.                        - Continue present medications.                        - Repeat colonoscopy in 5 years for screening                         purposes. Make sure to use adult colonoscope.                        -  Return to referring physician as previously                         scheduled. Procedure Code(s):     --- Professional ---                        Z6109, Colorectal cancer screening; colonoscopy on                         individual at high risk Diagnosis Code(s):     --- Professional ---                        Z83.71, Family history of colonic polyps                        K64.0, First degree hemorrhoids CPT copyright 2022 American Medical Association. All rights reserved. The codes documented in this report are preliminary and upon coder review may  be revised to meet current compliance requirements. Eather Colas MD, MD 06/14/2022 1:46:02 PM Number of Addenda: 0 Note Initiated On: 06/14/2022 1:00 PM Scope Withdrawal Time: 0 hours 11 minutes 4 seconds  Total Procedure Duration: 0 hours 24 minutes 50 seconds  Estimated Blood Loss:  Estimated blood loss: none.      [facility]

## 2022-06-14 NOTE — Interval H&P Note (Signed)
History and Physical Interval Note:  06/14/2022 1:08 PM  Lori Carr  has presented today for surgery, with the diagnosis of FH Colon Polyps.  The various methods of treatment have been discussed with the patient and family. After consideration of risks, benefits and other options for treatment, the patient has consented to  Procedure(s): COLONOSCOPY WITH PROPOFOL (N/A) as a surgical intervention.  The patient's history has been reviewed, patient examined, no change in status, stable for surgery.  I have reviewed the patient's chart and labs.  Questions were answered to the patient's satisfaction.     Regis Bill  Ok to proceed with colonoscopy

## 2022-06-14 NOTE — H&P (Signed)
Outpatient short stay form Pre-procedure 06/14/2022  Regis Bill, MD  Primary Physician: Joni Reining, PA-C  Reason for visit:  Screening  History of present illness:    57 y/o lady with history of gastric bypass surgery and DM II here for colonoscopy due to family history of polyps in her mother of unknown size. No blood thinners. No family history of GI malignancies.    Current Facility-Administered Medications:    0.9 %  sodium chloride infusion, , Intravenous, Continuous, Mavery Milling, Rossie Muskrat, MD, Last Rate: 20 mL/hr at 06/14/22 1236, New Bag at 06/14/22 1236  Medications Prior to Admission  Medication Sig Dispense Refill Last Dose   alendronate (FOSAMAX) 70 MG tablet Take 70 mg by mouth once a week.   Past Week   Continuous Blood Gluc Sensor (DEXCOM G6 SENSOR) MISC APPLY 1 SENSOR TO SKIN EVERY 10 DAYS 9 each 3    Continuous Blood Gluc Transmit (DEXCOM G6 TRANSMITTER) MISC Change every 90 day 1 each 3    glucose blood (ONETOUCH VERIO) test strip Use as instructed (Patient not taking: Reported on 04/25/2022) 400 each 3    insulin aspart (NOVOLOG) 100 UNIT/ML injection USE AS DIRECTED (20 UNITS MAX DAILY) AS NEEDED 20 mL 2    Insulin Disposable Pump (OMNIPOD 5 G6 POD, GEN 5,) MISC APPLY 1 POD AS DIRECTED AND CHANGE EVERY 72 HOURS 30 each 2    Insulin Pen Needle 32G X 4 MM MISC 1 Device by Does not apply route in the morning, at noon, in the evening, and at bedtime. 150 each 6    Lancets MISC Use as instructed (Patient not taking: Reported on 04/25/2022) 400 each 3    lisinopril (ZESTRIL) 10 MG tablet Take 1 tablet (10 mg total) by mouth daily. 90 tablet 3    naproxen (NAPROSYN) 500 MG tablet Take 1 tablet (500 mg total) by mouth 2 (two) times daily with a meal. 20 tablet 00    pantoprazole (PROTONIX) 40 MG tablet Take 1 tablet by mouth daily.      polyethylene glycol-electrolytes (NULYTELY) 420 g solution Take by mouth.      ULTICARE MINI PEN NEEDLES 31G X 6 MM MISC USE AS  DIRECTED FOUR TIMES DAILY 100 each 5    Vitamin D, Ergocalciferol, (DRISDOL) 1.25 MG (50000 UNIT) CAPS capsule Take 1 capsule (50,000 Units total) by mouth every 7 (seven) days. 5 capsule 2      Allergies  Allergen Reactions   Amoxicillin-Pot Clavulanate Itching    Did it involve swelling of the face/tongue/throat, SOB, or low BP? No Did it involve sudden or severe rash/hives, skin peeling, or any reaction on the inside of your mouth or nose? No Did you need to seek medical attention at a hospital or doctor's office? No When did it last happen?  A long time ago     If all above answers are "NO", may proceed with cephalosporin use.   Vicodin [Hydrocodone-Acetaminophen] Nausea Only     Past Medical History:  Diagnosis Date   chronic vaginal bleeding    Diabetes mellitus    type 2   DKA (diabetic ketoacidoses)    History of Elevated LDL cholesterol level    Obesity     Review of systems:  Otherwise negative.    Physical Exam  Gen: Alert, oriented. Appears stated age.  HEENT: PERRLA. Lungs: No respiratory distress CV: RRR Abd: soft, benign, no masses Ext: No edema    Planned procedures: Proceed with colonoscopy.  The patient understands the nature of the planned procedure, indications, risks, alternatives and potential complications including but not limited to bleeding, infection, perforation, damage to internal organs and possible oversedation/side effects from anesthesia. The patient agrees and gives consent to proceed.  Please refer to procedure notes for findings, recommendations and patient disposition/instructions.     Regis Bill, MD [facility]

## 2022-06-15 ENCOUNTER — Encounter: Payer: Self-pay | Admitting: Gastroenterology

## 2022-06-15 ENCOUNTER — Ambulatory Visit
Admission: RE | Admit: 2022-06-15 | Discharge: 2022-06-15 | Disposition: A | Discharge status: Home / Self Care | Payer: 59 | Source: Ambulatory Visit | Attending: Gastroenterology | Admitting: Gastroenterology

## 2022-06-15 DIAGNOSIS — R748 Abnormal levels of other serum enzymes: Secondary | ICD-10-CM

## 2022-06-15 DIAGNOSIS — K8080 Other cholelithiasis without obstruction: Secondary | ICD-10-CM | POA: Diagnosis not present

## 2022-06-15 NOTE — Anesthesia Postprocedure Evaluation (Signed)
Anesthesia Post Note  Patient: LAVONDA IMDIEKE  Procedure(s) Performed: COLONOSCOPY WITH PROPOFOL  Patient location during evaluation: PACU Anesthesia Type: General Level of consciousness: awake and alert Pain management: pain level controlled Vital Signs Assessment: post-procedure vital signs reviewed and stable Respiratory status: spontaneous breathing, nonlabored ventilation and respiratory function stable Cardiovascular status: blood pressure returned to baseline and stable Postop Assessment: no apparent nausea or vomiting Anesthetic complications: no   No notable events documented.   Last Vitals:  Vitals:   06/14/22 1352 06/14/22 1402  BP: (!) 113/52 129/76  Pulse: 78 70  Resp: 19 17  Temp:    SpO2: 98% 98%    Last Pain:  Vitals:   06/14/22 1402  TempSrc:   PainSc: 0-No pain                 Foye Deer

## 2022-07-06 ENCOUNTER — Other Ambulatory Visit: Payer: Self-pay | Admitting: Physician Assistant

## 2022-07-11 ENCOUNTER — Other Ambulatory Visit: Payer: Self-pay | Admitting: Physician Assistant

## 2022-07-11 DIAGNOSIS — Z1231 Encounter for screening mammogram for malignant neoplasm of breast: Secondary | ICD-10-CM

## 2022-07-20 DIAGNOSIS — E109 Type 1 diabetes mellitus without complications: Secondary | ICD-10-CM | POA: Diagnosis not present

## 2022-07-21 ENCOUNTER — Ambulatory Visit: Payer: Self-pay | Admitting: Emergency Medicine

## 2022-07-21 ENCOUNTER — Other Ambulatory Visit: Payer: Self-pay | Admitting: Internal Medicine

## 2022-07-21 ENCOUNTER — Ambulatory Visit
Admission: RE | Admit: 2022-07-21 | Discharge: 2022-07-21 | Disposition: A | Discharge status: Home / Self Care | Payer: 59 | Source: Ambulatory Visit | Attending: Emergency Medicine | Admitting: Emergency Medicine

## 2022-07-21 ENCOUNTER — Ambulatory Visit
Admission: RE | Admit: 2022-07-21 | Discharge: 2022-07-21 | Disposition: A | Discharge status: Home / Self Care | Payer: 59 | Attending: Physician Assistant | Admitting: Physician Assistant

## 2022-07-21 VITALS — BP 153/90 | HR 78 | Temp 97.3°F | Resp 12

## 2022-07-21 DIAGNOSIS — S60222A Contusion of left hand, initial encounter: Secondary | ICD-10-CM | POA: Diagnosis not present

## 2022-07-21 DIAGNOSIS — M19042 Primary osteoarthritis, left hand: Secondary | ICD-10-CM | POA: Diagnosis not present

## 2022-07-21 DIAGNOSIS — M25532 Pain in left wrist: Secondary | ICD-10-CM | POA: Diagnosis not present

## 2022-07-21 NOTE — Progress Notes (Signed)
S:   Pain left hand and wrist area after a mechanical fall 2 days ago.  Patient states she has taken an anti-inflammatory once but has not used any ice.  Today she states that pain is worse than it was initially and she is having difficulty moving her thumb due to pain and swelling.  O: Soft tissue edema noted left first metacarpal area dorsal aspect in comparison with the right.  Skin is intact.  Minimal ecchymosis.  Capillary refills less than 3 seconds.  No gross deformity noted in comparison with the right.  Decreased range of motion and patient is unable to fully extend and touch her fifth finger without difficulty.  There is some minimal tenderness on palpation of the left distal radius and ulna.  No deformity present.  Patient is able to slowly flex and extend her wrist.  A: Contusion left hand and wrist.  P: Will get x-ray imaging of her left hand and wrist to rule out fracture.  Patient is encouraged to use ice and elevation.

## 2022-07-21 NOTE — Progress Notes (Signed)
Tripped Tuesday evening & caught herself with her left hand.  States didn't hurt right away.  Pain started suddenly yesterday evening & unable to do anything with it.  States she hasn't taken any OTC meds & hasn't used ice or anything else.  AMD

## 2022-07-25 DIAGNOSIS — E1065 Type 1 diabetes mellitus with hyperglycemia: Secondary | ICD-10-CM | POA: Diagnosis not present

## 2022-07-25 DIAGNOSIS — I10 Essential (primary) hypertension: Secondary | ICD-10-CM | POA: Diagnosis not present

## 2022-08-22 ENCOUNTER — Ambulatory Visit
Admission: RE | Admit: 2022-08-22 | Discharge: 2022-08-22 | Disposition: A | Discharge status: Home / Self Care | Payer: 59 | Source: Ambulatory Visit | Attending: Physician Assistant | Admitting: Physician Assistant

## 2022-08-22 DIAGNOSIS — Z1231 Encounter for screening mammogram for malignant neoplasm of breast: Secondary | ICD-10-CM

## 2022-08-25 ENCOUNTER — Other Ambulatory Visit: Payer: Self-pay | Admitting: Physician Assistant

## 2022-08-25 DIAGNOSIS — R928 Other abnormal and inconclusive findings on diagnostic imaging of breast: Secondary | ICD-10-CM

## 2022-08-31 ENCOUNTER — Ambulatory Visit: Payer: 59 | Admitting: Physician Assistant

## 2022-08-31 ENCOUNTER — Encounter: Payer: Self-pay | Admitting: Physician Assistant

## 2022-08-31 ENCOUNTER — Ambulatory Visit: Payer: 59

## 2022-08-31 VITALS — BP 168/98 | HR 92 | Temp 97.9°F | Resp 16

## 2022-08-31 DIAGNOSIS — R928 Other abnormal and inconclusive findings on diagnostic imaging of breast: Secondary | ICD-10-CM

## 2022-08-31 DIAGNOSIS — K120 Recurrent oral aphthae: Secondary | ICD-10-CM

## 2022-08-31 DIAGNOSIS — J020 Streptococcal pharyngitis: Secondary | ICD-10-CM

## 2022-08-31 DIAGNOSIS — S0340XA Sprain of jaw, unspecified side, initial encounter: Secondary | ICD-10-CM

## 2022-08-31 DIAGNOSIS — R921 Mammographic calcification found on diagnostic imaging of breast: Secondary | ICD-10-CM | POA: Diagnosis not present

## 2022-08-31 DIAGNOSIS — J029 Acute pharyngitis, unspecified: Secondary | ICD-10-CM

## 2022-08-31 LAB — POC COVID19 BINAXNOW: SARS Coronavirus 2 Ag: NEGATIVE

## 2022-08-31 LAB — POCT RAPID STREP A (OFFICE): Rapid Strep A Screen: NEGATIVE

## 2022-08-31 MED ORDER — LIDOCAINE VISCOUS HCL 2 % MT SOLN: 5.0000 mL | Freq: Four times a day (QID) | OROMUCOSAL | 0 refills | Status: DC | PRN

## 2022-08-31 MED ORDER — PROMETHAZINE-DM 6.25-15 MG/5ML PO SYRP: 5.0000 mL | ORAL_SOLUTION | Freq: Four times a day (QID) | ORAL | 0 refills | Status: DC | PRN

## 2022-08-31 MED ORDER — METHYLPREDNISOLONE 4 MG PO TBPK: ORAL_TABLET | ORAL | 0 refills | Status: DC

## 2022-08-31 MED ORDER — BENZOCAINE 20 % MT PSTE: 1.0000 | PASTE | Freq: Four times a day (QID) | OROMUCOSAL | 9 refills | Status: DC | PRN

## 2022-08-31 NOTE — Progress Notes (Signed)
Complains day 3 of very sore throat with a "bumpy" tongue. Left ear discomfort along with teeth and jaw pain.  Stated did not take BP meds today and manual BP rechecked.  Encouraged to take meds.  Stated son has been in the hospital and it's very stressful for her along with needing to repeat mammogram.

## 2022-08-31 NOTE — Progress Notes (Signed)
   Subjective: Sore throat, left dental and jaw pain.    Patient ID: Lori Carr, female    DOB: 1965/03/01, 57 y.o.   MRN: [id number]  HPI Patient presents with 3 days of sore throat, left jaw pain, decreased range of motion with opening mouth, and forms under tongue on the left side.   Review of Systems Diabetes    Objective:   Physical Exam BP 168/98  Pulse 92  Resp 16  Temp 97.9 F (36.6 C)  SpO2 100 %  HEENT is remarkable for erythematous pharynx, decreased range of motion with opening mouth, and ulcer under left side of tongue.  Patient is negative for COVID-19 and strep pharyngitis.  No cervical lymphadenopathy.  Lungs are clear to auscultation.  Heart is regular rate and rhythm.       Assessment & Plan: Aphthous ulcer, pharyngitis, and TMJ.  Patient given a prescription for Orabase, Medrol Dosepak, and viscous lidocaine.  Patient advised to follow-up with dentist if no improvement TMJ symptoms.

## 2022-09-01 ENCOUNTER — Other Ambulatory Visit: Payer: Self-pay | Admitting: Physician Assistant

## 2022-09-01 DIAGNOSIS — R921 Mammographic calcification found on diagnostic imaging of breast: Secondary | ICD-10-CM

## 2022-09-02 ENCOUNTER — Ambulatory Visit: Admission: RE | Admit: 2022-09-02 | Payer: 59 | Source: Ambulatory Visit

## 2022-09-02 ENCOUNTER — Ambulatory Visit
Admission: RE | Admit: 2022-09-02 | Discharge: 2022-09-02 | Disposition: A | Discharge status: Home / Self Care | Payer: 59 | Source: Ambulatory Visit | Attending: Physician Assistant | Admitting: Physician Assistant

## 2022-09-02 DIAGNOSIS — R921 Mammographic calcification found on diagnostic imaging of breast: Secondary | ICD-10-CM | POA: Diagnosis not present

## 2022-09-02 HISTORY — PX: BREAST BIOPSY: SHX20

## 2022-09-07 DIAGNOSIS — E1065 Type 1 diabetes mellitus with hyperglycemia: Secondary | ICD-10-CM | POA: Diagnosis not present

## 2022-09-07 DIAGNOSIS — I10 Essential (primary) hypertension: Secondary | ICD-10-CM | POA: Diagnosis not present

## 2022-10-25 DIAGNOSIS — E1065 Type 1 diabetes mellitus with hyperglycemia: Secondary | ICD-10-CM | POA: Diagnosis not present

## 2022-10-25 DIAGNOSIS — I10 Essential (primary) hypertension: Secondary | ICD-10-CM | POA: Diagnosis not present

## 2022-12-09 DIAGNOSIS — Z113 Encounter for screening for infections with a predominantly sexual mode of transmission: Secondary | ICD-10-CM | POA: Diagnosis not present

## 2022-12-09 DIAGNOSIS — Z1331 Encounter for screening for depression: Secondary | ICD-10-CM | POA: Diagnosis not present

## 2022-12-09 DIAGNOSIS — Z01419 Encounter for gynecological examination (general) (routine) without abnormal findings: Secondary | ICD-10-CM | POA: Diagnosis not present

## 2022-12-09 DIAGNOSIS — Z124 Encounter for screening for malignant neoplasm of cervix: Secondary | ICD-10-CM | POA: Diagnosis not present

## 2022-12-09 DIAGNOSIS — Z01411 Encounter for gynecological examination (general) (routine) with abnormal findings: Secondary | ICD-10-CM | POA: Diagnosis not present

## 2022-12-23 ENCOUNTER — Other Ambulatory Visit: Payer: Self-pay

## 2022-12-23 DIAGNOSIS — E1065 Type 1 diabetes mellitus with hyperglycemia: Secondary | ICD-10-CM

## 2022-12-23 NOTE — Progress Notes (Signed)
Pt completed labs for endocrinology. Gretel Acre

## 2022-12-25 LAB — COMPREHENSIVE METABOLIC PANEL
ALT: 18 [IU]/L (ref 0–32)
AST: 21 [IU]/L (ref 0–40)
Albumin: 4.6 g/dL (ref 3.8–4.9)
Alkaline Phosphatase: 186 [IU]/L — ABNORMAL HIGH (ref 44–121)
BUN/Creatinine Ratio: 14 (ref 9–23)
BUN: 9 mg/dL (ref 6–24)
Bilirubin Total: 0.5 mg/dL (ref 0.0–1.2)
CO2: 28 mmol/L (ref 20–29)
Calcium: 9.2 mg/dL (ref 8.7–10.2)
Chloride: 101 mmol/L (ref 96–106)
Creatinine, Ser: 0.65 mg/dL (ref 0.57–1.00)
Globulin, Total: 2.1 g/dL (ref 1.5–4.5)
Glucose: 100 mg/dL — ABNORMAL HIGH (ref 70–99)
Potassium: 3.6 mmol/L (ref 3.5–5.2)
Sodium: 146 mmol/L — ABNORMAL HIGH (ref 134–144)
Total Protein: 6.7 g/dL (ref 6.0–8.5)
eGFR: 103 mL/min/{1.73_m2} (ref >59)

## 2022-12-25 LAB — MICROALBUMIN / CREATININE URINE RATIO
Creatinine, Urine: 199.9 mg/dL
Microalb/Creat Ratio: 26 mg/g{creat} (ref 0–29)
Microalbumin, Urine: 51.5 ug/mL

## 2022-12-25 LAB — HGB A1C W/O EAG: Hgb A1c MFr Bld: 10.2 % — ABNORMAL HIGH (ref 4.8–5.6)

## 2023-01-05 DIAGNOSIS — E1065 Type 1 diabetes mellitus with hyperglycemia: Secondary | ICD-10-CM | POA: Diagnosis not present

## 2023-01-05 DIAGNOSIS — I10 Essential (primary) hypertension: Secondary | ICD-10-CM | POA: Diagnosis not present

## 2023-02-24 ENCOUNTER — Other Ambulatory Visit: Payer: 59

## 2023-02-24 DIAGNOSIS — E1065 Type 1 diabetes mellitus with hyperglycemia: Secondary | ICD-10-CM

## 2023-02-24 LAB — POCT URINALYSIS DIPSTICK
Bilirubin, UA: NEGATIVE
Blood, UA: NEGATIVE
Glucose, UA: POSITIVE — AB
Ketones, UA: NEGATIVE
Leukocytes, UA: NEGATIVE
Nitrite, UA: NEGATIVE
Protein, UA: POSITIVE — AB
Spec Grav, UA: 1.025 (ref 1.010–1.025)
Urobilinogen, UA: 0.2 U/dL
pH, UA: 6 (ref 5.0–8.0)

## 2023-02-24 NOTE — Progress Notes (Signed)
Called requesting to have urine checked for ketones. States she hasn't been feeling well this week.  Felt similar to when she had ketoacidosi except she didn't feel short of breath. Scheduled for labs on Friday 03/03/2023 & will schedule appointment with Ron to complete her physical.  Last physical was 04/2022. She's interested in changing endocrinologist. She said if she continues to feel bad over the weekend she will seek treatment at Urgent Care or ED.

## 2023-02-27 ENCOUNTER — Telehealth: Payer: Self-pay

## 2023-02-27 NOTE — Telephone Encounter (Signed)
Ekaterini spoke with Oren Bracket, CMA & Christianne Dolin, CMA when they were downstairs in the building the clinic is in.    She told them to cancel her lab appointment that was scheduled for 03/03/2023.  She said it's too early.  Friday I advised her that her last physical was 04/25/2022.  Completing her physical in 03/2023 would only be a month early. She was spilling 3+ glucose in her urine on Friday (02/24/2023). Last A1c was 12/23/2022 - result was 10.2

## 2023-04-06 DIAGNOSIS — E1065 Type 1 diabetes mellitus with hyperglycemia: Secondary | ICD-10-CM | POA: Diagnosis not present

## 2023-04-06 DIAGNOSIS — I10 Essential (primary) hypertension: Secondary | ICD-10-CM | POA: Diagnosis not present

## 2023-04-07 DIAGNOSIS — M533 Sacrococcygeal disorders, not elsewhere classified: Secondary | ICD-10-CM | POA: Diagnosis not present

## 2023-04-07 DIAGNOSIS — M1711 Unilateral primary osteoarthritis, right knee: Secondary | ICD-10-CM | POA: Diagnosis not present

## 2023-04-07 DIAGNOSIS — S8391XA Sprain of unspecified site of right knee, initial encounter: Secondary | ICD-10-CM | POA: Diagnosis not present

## 2023-04-07 DIAGNOSIS — M19072 Primary osteoarthritis, left ankle and foot: Secondary | ICD-10-CM | POA: Diagnosis not present

## 2023-04-11 ENCOUNTER — Ambulatory Visit: Payer: Self-pay

## 2023-04-11 DIAGNOSIS — Z0000 Encounter for general adult medical examination without abnormal findings: Secondary | ICD-10-CM

## 2023-04-11 LAB — POCT URINALYSIS DIPSTICK
Bilirubin, UA: NEGATIVE
Blood, UA: NEGATIVE
Glucose, UA: POSITIVE — AB
Leukocytes, UA: NEGATIVE
Nitrite, UA: NEGATIVE
Protein, UA: NEGATIVE
Spec Grav, UA: 1.015 (ref 1.010–1.025)
Urobilinogen, UA: 0.2 U/dL
pH, UA: 6 (ref 5.0–8.0)

## 2023-04-11 NOTE — Progress Notes (Signed)
 Pt completed labs for physical. Gretel Acre

## 2023-04-12 LAB — CMP12+LP+TP+TSH+6AC+CBC/D/PLT
ALT: 22 IU/L (ref 0–32)
AST: 26 IU/L (ref 0–40)
Albumin: 3.6 g/dL — ABNORMAL LOW (ref 3.8–4.9)
Alkaline Phosphatase: 179 IU/L — ABNORMAL HIGH (ref 44–121)
BUN/Creatinine Ratio: 15 (ref 9–23)
BUN: 9 mg/dL (ref 6–24)
Basophils Absolute: 0.1 10*3/uL (ref 0.0–0.2)
Basos: 1 %
Bilirubin Total: 0.6 mg/dL (ref 0.0–1.2)
Calcium: 8.4 mg/dL — ABNORMAL LOW (ref 8.7–10.2)
Chloride: 100 mmol/L (ref 96–106)
Chol/HDL Ratio: 2.4 ratio (ref 0.0–4.4)
Cholesterol, Total: 111 mg/dL (ref 100–199)
Creatinine, Ser: 0.61 mg/dL (ref 0.57–1.00)
EOS (ABSOLUTE): 0.2 10*3/uL (ref 0.0–0.4)
Eos: 3 %
Estimated CHD Risk: <0.5 times avg. (ref 0.0–1.0)
Free Thyroxine Index: 2.8 (ref 1.2–4.9)
GGT: 6 IU/L (ref 0–60)
Globulin, Total: 2.2 g/dL (ref 1.5–4.5)
Glucose: 395 mg/dL — ABNORMAL HIGH (ref 70–99)
HDL: 46 mg/dL (ref >39)
Hematocrit: 40.2 % (ref 34.0–46.6)
Hemoglobin: 12.8 g/dL (ref 11.1–15.9)
Immature Grans (Abs): 0 10*3/uL (ref 0.0–0.1)
Immature Granulocytes: 0 %
Iron: 61 ug/dL (ref 27–159)
LDH: 284 IU/L — ABNORMAL HIGH (ref 119–226)
LDL Chol Calc (NIH): 52 mg/dL (ref 0–99)
Lymphocytes Absolute: 1.9 10*3/uL (ref 0.7–3.1)
Lymphs: 34 %
MCH: 28.3 pg (ref 26.6–33.0)
MCHC: 31.8 g/dL (ref 31.5–35.7)
MCV: 89 fL (ref 79–97)
Monocytes Absolute: 0.4 10*3/uL (ref 0.1–0.9)
Monocytes: 7 %
Neutrophils Absolute: 3 10*3/uL (ref 1.4–7.0)
Neutrophils: 55 %
Phosphorus: 3.2 mg/dL (ref 3.0–4.3)
Platelets: 230 10*3/uL (ref 150–450)
Potassium: 4.8 mmol/L (ref 3.5–5.2)
RBC: 4.53 x10E6/uL (ref 3.77–5.28)
RDW: 12.9 % (ref 11.7–15.4)
Sodium: 138 mmol/L (ref 134–144)
T3 Uptake Ratio: 31 % (ref 24–39)
T4, Total: 9.1 ug/dL (ref 4.5–12.0)
TSH: 3.24 u[IU]/mL (ref 0.450–4.500)
Total Protein: 5.8 g/dL — ABNORMAL LOW (ref 6.0–8.5)
Triglycerides: 58 mg/dL (ref 0–149)
Uric Acid: 1.8 mg/dL — ABNORMAL LOW (ref 3.0–7.2)
VLDL Cholesterol Cal: 13 mg/dL (ref 5–40)
WBC: 5.5 10*3/uL (ref 3.4–10.8)
eGFR: 105 mL/min/{1.73_m2} (ref >59)

## 2023-04-12 LAB — MICROALBUMIN / CREATININE URINE RATIO
Creatinine, Urine: 47.3 mg/dL
Microalb/Creat Ratio: <6 mg/g{creat} (ref 0–29)
Microalbumin, Urine: <3 ug/mL

## 2023-04-12 LAB — HGB A1C W/O EAG: Hgb A1c MFr Bld: 9.6 % — ABNORMAL HIGH (ref 4.8–5.6)

## 2023-04-17 DIAGNOSIS — S8391XA Sprain of unspecified site of right knee, initial encounter: Secondary | ICD-10-CM | POA: Diagnosis not present

## 2023-04-17 DIAGNOSIS — M1711 Unilateral primary osteoarthritis, right knee: Secondary | ICD-10-CM | POA: Diagnosis not present

## 2023-04-17 DIAGNOSIS — S93402A Sprain of unspecified ligament of left ankle, initial encounter: Secondary | ICD-10-CM | POA: Diagnosis not present

## 2023-04-18 ENCOUNTER — Encounter: Payer: Self-pay | Admitting: Physician Assistant

## 2023-04-18 ENCOUNTER — Ambulatory Visit: Payer: Self-pay | Admitting: Physician Assistant

## 2023-04-18 VITALS — BP 149/87 | HR 69 | Temp 97.6°F | Resp 12 | Ht 61.0 in | Wt 115.0 lb

## 2023-04-18 DIAGNOSIS — Z0000 Encounter for general adult medical examination without abnormal findings: Secondary | ICD-10-CM

## 2023-04-18 NOTE — Progress Notes (Signed)
 Pt presents today to complete physical, Pt denies any concerns at this time Lori Carr

## 2023-04-18 NOTE — Progress Notes (Signed)
 [facility] ____________________________________________   None    (approximate)  I have reviewed the triage vital signs and the nursing notes.   HISTORY  Chief Complaint No chief complaint on file.    HPI Lori Carr is a 58 y.o. female patient presents for annual physical exam         Past Medical History:  Diagnosis Date   chronic vaginal bleeding    Diabetes mellitus    type 2   DKA (diabetic ketoacidoses)    History of Elevated LDL cholesterol level    Obesity     Patient Active Problem List   Diagnosis Date Noted   Stress fracture of right foot 06/06/2022   History of bariatric surgery 06/06/2022   Osteoporosis 01/19/2022   Vitamin E deficiency 03/04/2021   Encounter for counseling 02/05/2021   Closed fracture of proximal phalanx of great toe 08/17/2020   Pain of toe of left foot 08/17/2020   Anxiety as acute reaction to exceptional stress 09/26/2019   Sinus tachycardia 09/26/2019   Status post bariatric surgery 08/06/2019   Pre-op evaluation 05/23/2019   Radial styloid tenosynovitis of right hand 03/27/2019   Gastritis without bleeding 02/11/2019   Sprain of knee 02/11/2019   Hand pain 09/20/2018   Neuropathy 09/03/2018   Type 1 diabetes mellitus with hyperglycemia (HCC) 08/31/2018   Diabetic ketoacidosis (HCC) 06/06/2018   AKI (acute kidney injury) (HCC) 06/06/2018   Postmenopausal bleeding 08/16/2017   BMI 40.0-44.9, adult (HCC) 06/07/2017   Morbid obesity (HCC) 06/07/2017   Snoring 04/27/2017   Type 1 diabetes mellitus without complication (HCC) 07/31/2014   Osteoarthritis of knee 07/22/2014   Dupuytren's contracture of foot 06/05/2013    Past Surgical History:  Procedure Laterality Date   BREAST BIOPSY Right 09/02/2022   MM RT BREAST BX W LOC DEV 1ST LESION IMAGE BX SPEC STEREO GUIDE 09/02/2022 [facility]MO[facility]   CESAREAN SECTION     x3   COLONOSCOPY  2018   COLONOSCOPY WITH PROPOFOL N/A 06/14/2022    Procedure: COLONOSCOPY WITH PROPOFOL;  Surgeon: Regis Bill, MD;  Location: [facility] END[facility]COPY;  Service: Endoscopy;  Laterality: N/A;   DILITATION & CURRETTAGE/HYSTROSCOPY WITH NOVASURE ABLATION N/A 07/03/2018   Procedure: DILATATION & CURETTAGE/HYSTEROSCOPY WITH Possible NOVASURE ABLATION;  Surgeon: Olivia Mackie, MD;  Location: [facility];  Service: Gynecology;  Laterality: N/A;   LAPAROSCOPIC GASTRIC RESTRICTIVE DUODENAL PROCEDURE (DUODENAL SWITCH) N/A 07/11/2019   tissue removed from stomach  as child   TYMPANOSTOMY TUBE PLACEMENT  as child   WRIST SURGERY Right 03/29/2019    Prior to Admission medications   Medication Sig Start Date End Date Taking? Authorizing Provider  alendronate (FOSAMAX) 70 MG tablet Take 70 mg by mouth once a week.    [provider]  benzocaine (ORABASE-B) 20 % PSTE Use as directed 1 Application in the mouth or throat 4 (four) times daily as needed for mouth pain. 08/31/22   Joni Reining, PA-C  Continuous Blood Gluc Sensor (DEXCOM G6 SENSOR) MISC APPLY 1 SENSOR TO SKIN EVERY 10 DAYS 03/06/22   Shamleffer, Konrad Dolores, MD  Continuous Blood Gluc Transmit (DEXCOM G6 TRANSMITTER) MISC Change every 90 day 04/19/22   Shamleffer, Konrad Dolores, MD  glucose blood (ONETOUCH VERIO) test strip Use as instructed Patient not taking: Reported on 04/25/2022 07/30/19   Joni Reining, PA-C  insulin aspart (NOVOLOG) 100 UNIT/ML injection USE AS DIRECTED (20 UNITS MAX DAILY) AS NEEDED 05/08/22   Shamleffer,  Konrad Dolores, MD  Insulin Disposable Pump (OMNIPOD 5 G6 PODS, GEN 5,) MISC APPLY 1 POD AS DIRECTED AND REPLACE POD EVERY 72 HOURS. 07/21/22   Shamleffer, Konrad Dolores, MD  Insulin Pen Needle 32G X 4 MM MISC 1 Device by Does not apply route in the morning, at noon, in the evening, and at bedtime. 07/23/19   Shamleffer, Konrad Dolores, MD  Lancets MISC Use as instructed Patient not taking: Reported on 04/25/2022 07/29/19   Bridget Hartshorn L,  PA-C  lidocaine (XYLOCAINE) 2 % solution Use as directed 5 mLs in the mouth or throat every 6 (six) hours as needed for mouth pain. Mix with 5 ml of Phenergan DM for swish and swallow 08/31/22   Joni Reining, PA-C  lisinopril (ZESTRIL) 10 MG tablet Take 1 tablet (10 mg total) by mouth daily. 11/10/20   Joni Reining, PA-C  methylPREDNISolone (MEDROL DOSEPAK) 4 MG TBPK tablet Take Tapered dose as directed 08/31/22   Joni Reining, PA-C  pantoprazole (PROTONIX) 40 MG tablet Take 1 tablet by mouth daily. 03/23/20   [provider]  promethazine-dextromethorphan (PROMETHAZINE-DM) 6.25-15 MG/5ML syrup Take 5 mLs by mouth 4 (four) times daily as needed for cough. Mix with 5 mL of viscous lidocaine for swish and swallow 08/31/22   Joni Reining, PA-C  ULTICARE MINI PEN NEEDLES 31G X 6 MM MISC USE AS DIRECTED FOUR TIMES DAILY 05/16/20   Shamleffer, Konrad Dolores, MD  Vitamin D, Ergocalciferol, (DRISDOL) 1.25 MG (50000 UNIT) CAPS capsule Take 1 capsule (50,000 Units total) by mouth every 7 (seven) days. 05/02/22   Joni Reining, PA-C    Allergies Amoxicillin-pot clavulanate and Vicodin [hydrocodone-acetaminophen]  Family History  Problem Relation Age of Onset   Ovarian cancer Mother 38       deceased age 52 stage IV ovarian cancer   Breast cancer Maternal Aunt 53   Breast cancer Paternal Grandmother 64   Breast cancer Paternal Aunt 83   Stroke Brother    Diabetes Mellitus I Brother     Social History Social History   Tobacco Use   Smoking status: Never   Smokeless tobacco: Never  Vaping Use   Vaping status: Never Used  Substance Use Topics   Alcohol use: No   Drug use: No    Review of Systems  Constitutional: No fever/chills Eyes: No visual changes. ENT: No sore throat. Cardiovascular: Denies chest pain. Respiratory: Denies shortness of breath. Gastrointestinal: No abdominal pain.  No nausea, no vomiting.  No diarrhea.  No constipation. Genitourinary: Negative for  dysuria. Musculoskeletal: Negative for back pain. Skin: Negative for rash. Neurological: Negative for headaches, focal weakness or numbness. Psychiatric:  Endocrine: Diabetes anxiety, and osteoarthritis. Hematological/Lymphatic:  Allergic/Immunilogical: Augmentin and Vicodin ____________________________________________   PHYSICAL EXAM: BP 149/87BP. 149/87. Data is abnormal. Taken on 04/18/23 9:07 AM  Cuff Size Normal  Pulse Rate 69  Temp 97.6 F (36.4 C)  Temp Source Temporal  Weight 115 lb (52.2 kg)  Height 5\' 1"  (1.549 m)  Resp 12  SpO2 97 %   BMI: 21.73 kg/m2  BSA: 1.50 m2   VITAL SIGNS: Constitutional: Alert and oriented. Well appearing and in no acute distress. Eyes: Conjunctivae are normal. PERRL. EOMI. Head: Atraumatic. Nose: No congestion/rhinnorhea. Mouth/Throat: Mucous membranes are moist.  Oropharynx non-erythematous. Neck: No stridor.  No cervical spine tenderness to palpation. Hematological/Lymphatic/Immunilogical: No cervical lymphadenopathy. Cardiovascular: Normal rate, regular rhythm. Grossly normal heart sounds.  Good peripheral circulation. Respiratory: Normal respiratory effort.  No retractions.  Lungs CTAB. Gastrointestinal: Soft and nontender. No distention. No abdominal bruits. No CVA tenderness. Genitourinary: Deferred Musculoskeletal: No lower extremity tenderness nor edema.  No joint effusions. Neurologic:  Normal speech and language. No gross focal neurologic deficits are appreciated. No gait instability. Skin:  Skin is warm, dry and intact. No rash noted. Psychiatric: Mood and affect are normal. Speech and behavior are normal.  ____________________________________________   LABS _          Component Ref Range & Units (hover) 7 d ago (04/11/23) 1 mo ago (02/24/23) 11 mo ago (04/25/22) 12 mo ago (04/22/22) 1 yr ago (05/24/21) 1 yr ago (05/17/21) 2 yr ago (05/11/20)  Color, UA yellow Amber yellow dark yellow Yellow  Yellow  Clarity, UA clear  Clear clear cloudy Clear  Clear  Glucose, UA Positive Abnormal  Positive Abnormal  CM Positive Abnormal  CM Positive Abnormal  CM Negative  Positive Abnormal  CM  Comment: 3+  Bilirubin, UA neg Negative neg 1+ Abnormal  Negative  Negative  Ketones, UA trace -+ Negative neg neg Negative  Negative  Spec Grav, UA 1.015 1.025 >=1.030 Abnormal  >=1.030 Abnormal  1.025  1.025  Blood, UA neg Negative neg neg Negative  Negative  pH, UA 6.0 6.0 5.5 6.0 6.0  6.0  Protein, UA Negative Positive Abnormal  CM Negative Positive Abnormal  CM Negative  Negative  Urobilinogen, UA 0.2 0.2 0.2 1.0 0.2  0.2  Nitrite, UA neg Negative neg neg Negative  Negative  Leukocytes, UA Negative Negative Negative Trace Abnormal  Negative  Negative  Appearance   dark   CLEAR Abnormal  R   Odor         Resulting Agency      [facility]                      Component Ref Range & Units (hover) 7 d ago (04/11/23) 3 mo ago (12/23/22) 12 mo ago (04/22/22) 1 yr ago (06/24/21) 1 yr ago (05/21/21) 1 yr ago (05/17/21) 1 yr ago (05/17/21) 1 yr ago (05/17/21)  Glucose 395 High  100 High  116 High  362 High  191 High  325 High  CM 547 High Panic  CM   Uric Acid 1.8 Low   1.7 Low  CM  1.6 Low  CM     Comment:            Therapeutic target for gout patients: <6.0  BUN 9 9 9 10 7 15  R 16 R   Creatinine, Ser 0.61 0.65 0.74 0.72 0.57 0.73 R 0.89 R   eGFR 105 103 95 99 108     BUN/Creatinine Ratio 15 14 12 14 12      Sodium 138 146 High  142 142 139 131 Low  R 131 Low  R   Potassium 4.8 3.6 3.6 4.4 3.4 Low  3.3 Low  R 4.5 R   Chloride 100 101 102 102 98 100 R 97 Low  R   Calcium 8.4 Low  9.2 8.5 Low  8.9 8.9 8.1 Low  R 8.7 Low  R   Phosphorus 3.2  4.6 High   4.1     Total Protein 5.8 Low  6.7 6.0  6.1     Albumin 3.6 Low  4.6 3.7 Low   3.7 Low      Globulin, Total 2.2 2.1 2.3  2.4     Bilirubin Total 0.6 0.5 0.9  0.4  Alkaline Phosphatase 179 High  186 High  188 High   162 High      LDH 284 High   214  225     AST 26 21 16   31      ALT 22 18 18  27      GGT 6  5  6      Iron 61  43  44     Cholesterol, Total 111  134  144     Triglycerides 58  50  62     HDL 46  61  68     VLDL Cholesterol Cal 13  11  13      LDL Chol Calc (NIH) 52  62  63     Chol/HDL Ratio 2.4  2.2 CM  2.1 CM     Comment:                                   T. Chol/HDL Ratio                                             Men  Women                               1/2 Avg.Risk  3.4    3.3                                   Avg.Risk  5.0    4.4                                2X Avg.Risk  9.6    7.1                                3X Avg.Risk 23.4   11.0  Estimated CHD Risk  < 0.5   < 0.5 CM   < 0.5 CM     Comment: The CHD Risk is based on the T. Chol/HDL ratio. Other factors affect CHD Risk such as hypertension, smoking, diabetes, severe obesity, and family history of premature CHD.  TSH 3.240  2.450  2.600     T4, Total 9.1  7.7  7.2     T3 Uptake Ratio 31  27  26      Free Thyroxine Index 2.8  2.1  1.9     WBC 5.5  8.7  9.9   10.1 R  RBC 4.53  5.13  4.72   4.86 R  Hemoglobin 12.8  14.5  13.7   14.0 R  Hematocrit 40.2  44.1  41.3   43.4 R  MCV 89  86  88   89.3 R  MCH 28.3  28.3  29.0   28.8 R  MCHC 31.8  32.9  33.2   32.3 R  RDW 12.9  13.5  12.6   12.6 R  Platelets 230  236  328   273 R  Neutrophils 55  54  63     Lymphs 34  34  27     Monocytes 7  8  6     Eos 3  3  2      Basos 1  1  1      Neutrophils Absolute 3.0  4.8  6.4     Lymphocytes Absolute 1.9  3.0  2.6     Monocytes Absolute 0.4  0.7  0.6     EOS (ABSOLUTE) 0.2  0.2  0.2     Basophils Absolute 0.1  0.1  0.1     Immature Granulocytes 0  0  1     Immature Grans (Abs) 0.0  0.0  0.1     Resulting Agency [facility]         Narrative Performed by: Verdell Carmine        View All Conversations on this Encounter                 Component Ref Range & Units (hover) 7 d ago (04/11/23) 3 mo ago (12/23/22) 12 mo  ago (04/22/22) 1 yr ago (05/24/21) 1 yr ago (05/21/21) 4 yr ago (03/19/19) 5 yr ago (04/04/18)  Creatinine, Urine 47.3 199.9 172.9 91.7 CANCELED R, CM 120.0 111.6  Microalbumin, Urine <3.0 51.5 22.1 6.4 CANCELED R, CM 3.9 30.7  Microalb/Creat Ratio <6 26 CM 13 CM 7 CM  3 CM 28 CM  Comment:                        Normal:                0 -  29                        Moderately increased: 30 - 300                        Severely increased:       >300             ___________________________________________  EKG  Sinus rhythm at 67 bpm voltage criteria consistent LVH.  Asymptomatic ____________________________________________    ____________________________________________   INITIAL IMPRESSION / ASSESSMENT AND PLAN  As part of my medical decision making, I reviewed the following data within the electronic MEDICAL RECORD NUMBER      No acute findings on physical exam or EKG.  Labs continue to show glucosuria and proteinuria.  Patient advised to control.  Will follow-up treating endocrinologist/PCP.    ____________________________________________   FINAL CLINICAL IMPRESSION@[facility] @ [facility] exam  ED Discharge Orders     None        Note:  This document was prepared using Dragon voice recognition software and may include unintentional dictation errors.

## 2023-04-25 DIAGNOSIS — M25561 Pain in right knee: Secondary | ICD-10-CM | POA: Diagnosis not present

## 2023-05-03 DIAGNOSIS — M25561 Pain in right knee: Secondary | ICD-10-CM | POA: Diagnosis not present

## 2023-05-08 DIAGNOSIS — M8438XA Stress fracture, other site, initial encounter for fracture: Secondary | ICD-10-CM | POA: Diagnosis not present

## 2023-06-12 DIAGNOSIS — N179 Acute kidney failure, unspecified: Secondary | ICD-10-CM | POA: Diagnosis not present

## 2023-06-12 DIAGNOSIS — E785 Hyperlipidemia, unspecified: Secondary | ICD-10-CM | POA: Diagnosis not present

## 2023-06-12 DIAGNOSIS — S199XXA Unspecified injury of neck, initial encounter: Secondary | ICD-10-CM | POA: Diagnosis not present

## 2023-06-12 DIAGNOSIS — Z794 Long term (current) use of insulin: Secondary | ICD-10-CM | POA: Diagnosis not present

## 2023-06-12 DIAGNOSIS — E875 Hyperkalemia: Secondary | ICD-10-CM | POA: Diagnosis not present

## 2023-06-12 DIAGNOSIS — E860 Dehydration: Secondary | ICD-10-CM | POA: Diagnosis not present

## 2023-06-12 DIAGNOSIS — R519 Headache, unspecified: Secondary | ICD-10-CM | POA: Diagnosis not present

## 2023-06-12 DIAGNOSIS — E1110 Type 2 diabetes mellitus with ketoacidosis without coma: Secondary | ICD-10-CM | POA: Diagnosis not present

## 2023-06-12 DIAGNOSIS — E1065 Type 1 diabetes mellitus with hyperglycemia: Secondary | ICD-10-CM | POA: Diagnosis not present

## 2023-06-12 DIAGNOSIS — E1010 Type 1 diabetes mellitus with ketoacidosis without coma: Secondary | ICD-10-CM | POA: Diagnosis not present

## 2023-06-12 DIAGNOSIS — Z9641 Presence of insulin pump (external) (internal): Secondary | ICD-10-CM | POA: Diagnosis not present

## 2023-06-12 DIAGNOSIS — D72829 Elevated white blood cell count, unspecified: Secondary | ICD-10-CM | POA: Diagnosis not present

## 2023-06-12 DIAGNOSIS — M47812 Spondylosis without myelopathy or radiculopathy, cervical region: Secondary | ICD-10-CM | POA: Diagnosis not present

## 2023-06-12 DIAGNOSIS — R079 Chest pain, unspecified: Secondary | ICD-10-CM | POA: Diagnosis not present

## 2023-06-12 DIAGNOSIS — D649 Anemia, unspecified: Secondary | ICD-10-CM | POA: Diagnosis not present

## 2023-06-12 DIAGNOSIS — R6889 Other general symptoms and signs: Secondary | ICD-10-CM | POA: Diagnosis not present

## 2023-06-12 DIAGNOSIS — E871 Hypo-osmolality and hyponatremia: Secondary | ICD-10-CM | POA: Diagnosis not present

## 2023-06-12 DIAGNOSIS — I10 Essential (primary) hypertension: Secondary | ICD-10-CM | POA: Diagnosis not present

## 2023-06-12 DIAGNOSIS — R6511 Systemic inflammatory response syndrome (SIRS) of non-infectious origin with acute organ dysfunction: Secondary | ICD-10-CM | POA: Diagnosis not present

## 2023-06-16 NOTE — Transitions of Care (Post Inpatient/ED Visit) (Signed)
 06/16/2023  Patient ID: Lori Carr, female   DOB: 1965-04-24, 58 y.o.   MRN: [id number]  Chart Review for transitions of care.  See [facility] for documentation.  Malike Foglio J. Jessabelle Markiewicz RN, MSN [facility], [facility] RN Care Manager Direct Dial: [phone]  Fax: [phone] Website: [email or web address]

## 2023-06-19 ENCOUNTER — Other Ambulatory Visit: Payer: Self-pay | Admitting: Physician Assistant

## 2023-06-23 DIAGNOSIS — M8438XA Stress fracture, other site, initial encounter for fracture: Secondary | ICD-10-CM | POA: Diagnosis not present

## 2023-06-23 DIAGNOSIS — M1711 Unilateral primary osteoarthritis, right knee: Secondary | ICD-10-CM | POA: Diagnosis not present

## 2023-07-06 DIAGNOSIS — H524 Presbyopia: Secondary | ICD-10-CM | POA: Diagnosis not present

## 2023-07-11 ENCOUNTER — Telehealth: Payer: Self-pay

## 2023-07-11 DIAGNOSIS — R140 Abdominal distension (gaseous): Secondary | ICD-10-CM

## 2023-07-11 DIAGNOSIS — R143 Flatulence: Secondary | ICD-10-CM

## 2023-07-11 DIAGNOSIS — R194 Change in bowel habit: Secondary | ICD-10-CM

## 2023-07-11 DIAGNOSIS — R195 Other fecal abnormalities: Secondary | ICD-10-CM

## 2023-07-11 NOTE — Telephone Encounter (Signed)
 Lori Carr called [facility] requesting a referral to Dr. Joeline Murray at [facility].  States he was recommended to her by a friend. C/O "stomach issues" since DKA & hospitalization a month ago. When asked to elaborate on "stomach issues", states increased frequency in going to the bathroom, passing gas, feeling bloated and liquid BM's (no solid BM).

## 2023-07-12 ENCOUNTER — Telehealth: Payer: Self-pay

## 2023-07-12 DIAGNOSIS — E1065 Type 1 diabetes mellitus with hyperglycemia: Secondary | ICD-10-CM

## 2023-07-12 DIAGNOSIS — E1310 Other specified diabetes mellitus with ketoacidosis without coma: Secondary | ICD-10-CM

## 2023-07-12 NOTE — Telephone Encounter (Signed)
 Called requesting a new referral to a new Endocrinologist. She would like to see Hulon Magic, NP.  She was recommended to her by a co-worker who sees Hulon Magic, NP. Jonnae called the clinic & spoke with Mansfield Seip who said she needed a referral from her PCP. Jessie instructed us  to mark the referral as Urgent due to elevated A1c and recent DKA episode and hospitalization.  Maranatha was trying to get an appointment for this month.  [facility] [employer] starts over on 08/01/2023 and she she's met her deductible for current fiscal year (08/01/2022 - 07/31/2023). Mansfield Seip told Shatisha they will put her on the cancellation list - currently they are booking appointments in October of 2025.

## 2023-07-13 DIAGNOSIS — I10 Essential (primary) hypertension: Secondary | ICD-10-CM | POA: Diagnosis not present

## 2023-07-13 DIAGNOSIS — E1065 Type 1 diabetes mellitus with hyperglycemia: Secondary | ICD-10-CM | POA: Diagnosis not present

## 2023-07-19 DIAGNOSIS — Z9884 Bariatric surgery status: Secondary | ICD-10-CM | POA: Diagnosis not present

## 2023-07-19 DIAGNOSIS — Z6822 Body mass index (BMI) 22.0-22.9, adult: Secondary | ICD-10-CM | POA: Diagnosis not present

## 2023-07-21 NOTE — Patient Instructions (Signed)

## 2023-07-24 ENCOUNTER — Ambulatory Visit (INDEPENDENT_AMBULATORY_CARE_PROVIDER_SITE_OTHER): Payer: Self-pay | Admitting: Nurse Practitioner

## 2023-07-24 ENCOUNTER — Encounter: Payer: Self-pay | Admitting: Nurse Practitioner

## 2023-07-24 VITALS — BP 122/70 | HR 73 | Ht 61.0 in | Wt 118.8 lb

## 2023-07-24 DIAGNOSIS — E1065 Type 1 diabetes mellitus with hyperglycemia: Secondary | ICD-10-CM

## 2023-07-24 DIAGNOSIS — Z794 Long term (current) use of insulin: Secondary | ICD-10-CM

## 2023-07-24 LAB — POCT GLYCOSYLATED HEMOGLOBIN (HGB A1C): Hemoglobin A1C: 8.8 % — AB (ref 4.0–5.6)

## 2023-07-24 NOTE — Progress Notes (Signed)
 Endocrinology Consult Note       07/24/2023, 1:02 PM   Subjective:    Patient ID: Lori Carr, female    DOB: 05/23/1965.  Lori Carr is being seen in consultation for management of currently uncontrolled symptomatic diabetes requested by  Claudene Tanda POUR, PA-C.   Past Medical History:  Diagnosis Date   chronic vaginal bleeding    Diabetes mellitus    type 2   DKA (diabetic ketoacidoses)    History of Elevated LDL cholesterol level    Obesity     Past Surgical History:  Procedure Laterality Date   BREAST BIOPSY Right 09/02/2022   MM RT BREAST BX W LOC DEV 1ST LESION IMAGE BX SPEC STEREO GUIDE 09/02/2022 [facility]   CESAREAN SECTION     x3   COLONOSCOPY  2018   COLONOSCOPY WITH PROPOFOL  N/A 06/14/2022   Procedure: COLONOSCOPY WITH PROPOFOL ;  Surgeon: Maryruth Ole DASEN, MD;  Location: [facility];  Service: Endoscopy;  Laterality: N/A;   DILITATION & CURRETTAGE/HYSTROSCOPY WITH NOVASURE ABLATION N/A 07/03/2018   Procedure: DILATATION & CURETTAGE/HYSTEROSCOPY WITH Possible NOVASURE ABLATION;  Surgeon: Gorge Ade, MD;  Location: [facility];  Service: Gynecology;  Laterality: N/A;   LAPAROSCOPIC GASTRIC RESTRICTIVE DUODENAL PROCEDURE (DUODENAL SWITCH) N/A 07/11/2019   tissue removed from stomach  as child   TYMPANOSTOMY TUBE PLACEMENT  as child   WRIST SURGERY Right 03/29/2019    Social History   Socioeconomic History   Marital status: Married    Spouse name: Not on file   Number of children: Not on file   Years of education: Not on file   Highest education level: Not on file  Occupational History   Occupation: Merchandiser, retail, 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    Employer: [employer]  Tobacco Use   Smoking status: Never   Smokeless tobacco: Never  Vaping Use   Vaping status: Never Used  Substance and Sexual Activity   Alcohol use: No   Drug use: No   Sexual activity: Yes     Partners: Male  Other Topics Concern   Not on file  Social History Narrative   Not on file   Social Drivers of Health   Financial Resource Strain: Not on file  Food Insecurity: Not on file  Transportation Needs: Not on file  Physical Activity: Not on file  Stress: Not on file  Social Connections: Not on file    Family History  Problem Relation Age of Onset   Ovarian cancer Mother 79       deceased age 62 stage IV ovarian cancer   Breast cancer Maternal Aunt 20   Breast cancer Paternal Grandmother 24   Breast cancer Paternal Aunt 23   Stroke Brother    Diabetes Mellitus I Brother     Outpatient Encounter Medications as of 07/24/2023  Medication Sig   alendronate (FOSAMAX) 70 MG tablet Take 70 mg by mouth once a week.   Continuous Glucose Sensor (DEXCOM G7 SENSOR) MISC by Does not apply route as directed.   glucose blood (ONETOUCH VERIO) test strip Use as instructed   insulin  aspart (NOVOLOG ) 100 UNIT/ML injection USE AS DIRECTED (20 UNITS MAX DAILY) AS  NEEDED   Insulin  Disposable Pump (OMNIPOD 5 G6 PODS, GEN 5,) MISC APPLY 1 POD AS DIRECTED AND REPLACE POD EVERY 72 HOURS.   Insulin  Pen Needle 32G X 4 MM MISC 1 Device by Does not apply route in the morning, at noon, in the evening, and at bedtime.   Lancets MISC Use as instructed   lidocaine  (XYLOCAINE ) 2 % solution Use as directed 5 mLs in the mouth or throat every 6 (six) hours as needed for mouth pain. Mix with 5 ml of Phenergan  DM for swish and swallow   lisinopril  (ZESTRIL ) 10 MG tablet Take 1 tablet (10 mg total) by mouth daily.   meloxicam  (MOBIC ) 15 MG tablet Take 15 mg by mouth daily.   OVER THE COUNTER MEDICATION Take by mouth daily. Patient reports that she takes  ADEK vitamin   pantoprazole (PROTONIX) 40 MG tablet Take 1 tablet by mouth daily.   potassium chloride  SA (KLOR-CON  M) 20 MEQ tablet TAKE 1 TABLET BY MOUTH DAILY.   traMADol  (ULTRAM ) 50 MG tablet Take 50 mg by mouth every 6 (six) hours as needed.    ULTICARE MINI PEN NEEDLES 31G X 6 MM MISC USE AS DIRECTED FOUR TIMES DAILY   benzocaine  (ORABASE-B) 20 % PSTE Use as directed 1 Application in the mouth or throat 4 (four) times daily as needed for mouth pain. (Patient not taking: Reported on 07/24/2023)   Continuous Blood Gluc Sensor (DEXCOM G6 SENSOR) MISC APPLY 1 SENSOR TO SKIN EVERY 10 DAYS (Patient not taking: Reported on 07/24/2023)   Continuous Blood Gluc Transmit (DEXCOM G6 TRANSMITTER) MISC Change every 90 day (Patient not taking: Reported on 07/24/2023)   methylPREDNISolone  (MEDROL  DOSEPAK) 4 MG TBPK tablet Take Tapered dose as directed (Patient not taking: Reported on 07/24/2023)   promethazine -dextromethorphan (PROMETHAZINE -DM) 6.25-15 MG/5ML syrup Take 5 mLs by mouth 4 (four) times daily as needed for cough. Mix with 5 mL of viscous lidocaine  for swish and swallow (Patient not taking: Reported on 07/24/2023)   Vitamin D , Ergocalciferol , (DRISDOL ) 1.25 MG (50000 UNIT) CAPS capsule Take 1 capsule (50,000 Units total) by mouth every 7 (seven) days. (Patient not taking: Reported on 07/24/2023)   No facility-administered encounter medications on file as of 07/24/2023.    ALLERGIES: Allergies  Allergen Reactions   Amoxicillin-Pot Clavulanate Itching    Did it involve swelling of the face/tongue/throat, SOB, or low BP? No Did it involve sudden or severe rash/hives, skin peeling, or any reaction on the inside of your mouth or nose? No Did you need to seek medical attention at a hospital or doctor's office? No When did it last happen?  A long time ago     If all above answers are "NO", may proceed with cephalosporin use.   Hydrocodone -Acetaminophen  Nausea Only   Vicodin [Hydrocodone -Acetaminophen ] Nausea Only    VACCINATION STATUS: Immunization History  Administered Date(s) Administered   Influenza,inj,Quad PF,6+ Mos 11/22/2016, 11/14/2017   Influenza,inj,Quad PF,6-35 Mos 11/10/2015   Influenza-Unspecified 11/28/2007   Tdap 06/15/2016     Diabetes She presents for her initial diabetic visit. She has type 1 diabetes mellitus. Onset time: diagnosed at approx age of 19- had GD and misdiagnosed as type 2 previously. Her disease course has been fluctuating. Hypoglycemia symptoms include nervousness/anxiousness, sweats and tremors. There are no diabetic associated symptoms. There are no hypoglycemic complications. Symptoms are stable. Diabetic complications include peripheral neuropathy. (Several incidences of DKA in the past- last episode in May) Risk factors for coronary artery disease include diabetes mellitus. Current  diabetic treatment includes insulin  pump (Omnipod 5). She is compliant with treatment most of the time. Her weight is fluctuating minimally. She is following a generally healthy diet. Meal planning includes ADA exchanges. She has not had a previous visit with a dietitian. She participates in exercise intermittently. (She presents today for her consultation with her Omnipod 5 G7 combo showing fluctuating glycemic profile, mostly above target.  Her most recent A1c, checked last week was 8.8% improving from last A1c of 9.6%.  She has been using Omnipod 5 for the last year and a half.  She is still learning the specifics of it.  She notes she does not input carbs like the pump says, as it will typically lead to a hypoglycemic event.  Her current settings are basal rate of 0.35-0.4 units per hour; duration of action 2.5 hrs, insulin  to carb ratio 1:15; Correction factor of 30.  She notes her pump loses connection with her Dexcom at times.  She drinks tea and some water , occasionally a soda but due to her hx of gastric bypass does not do this often.  She eats 3 meals per day and tends to snack at night.  She does not engage in any routine physical activity.  She is UTD on eye exam, sees podiatry routinely.) An ACE inhibitor/angiotensin II receptor blocker is being taken. She does not see a podiatrist.Eye exam is current.     Review  of systems  Constitutional: + Minimally fluctuating body weight, current Body mass index is 22.45 kg/m., no fatigue, no subjective hyperthermia, no subjective hypothermia Eyes: no blurry vision, no xerophthalmia ENT: no sore throat, no nodules palpated in throat, no dysphagia/odynophagia, no hoarseness Cardiovascular: no chest pain, no shortness of breath, no palpitations, no leg swelling Respiratory: no cough, no shortness of breath Gastrointestinal: no nausea/vomiting/diarrhea Musculoskeletal: no muscle/joint aches Skin: no rashes, no hyperemia Neurological: no tremors, no numbness, no tingling, no dizziness Psychiatric: no depression, no anxiety  Objective:     BP 122/70 (BP Location: Left Arm, Patient Position: Sitting, Cuff Size: Large)   Pulse 73   Ht 5' 1 (1.549 m)   Wt 118 lb 12.8 oz (53.9 kg)   BMI 22.45 kg/m   Wt Readings from Last 3 Encounters:  07/24/23 118 lb 12.8 oz (53.9 kg)  04/18/23 115 lb (52.2 kg)  06/14/22 119 lb 6.4 oz (54.2 kg)     BP Readings from Last 3 Encounters:  07/24/23 122/70  04/18/23 (!) 149/87  08/31/22 (!) 168/98     Physical Exam- Limited  Constitutional:  Body mass index is 22.45 kg/m. , not in acute distress, normal state of mind Eyes:  EOMI, no exophthalmos Neck: Supple Cardiovascular: RRR, no murmurs, rubs, or gallops, no edema Respiratory: Adequate breathing efforts, no crackles, rales, rhonchi, or wheezing Musculoskeletal: no gross deformities, strength intact in all four extremities, no gross restriction of joint movements Skin:  no rashes, no hyperemia Neurological: no tremor with outstretched hands   Diabetic Foot Exam - Simple   No data filed      CMP ( most recent) CMP     Component Value Date/Time   NA 138 04/11/2023 0837   K 4.8 04/11/2023 0837   CL 100 04/11/2023 0837   CO2 28 12/23/2022 0822   GLUCOSE 395 (H) 04/11/2023 0837   GLUCOSE 325 (H) 05/17/2021 1547   BUN 9 04/11/2023 0837   CREATININE 0.61  04/11/2023 0837   CALCIUM  8.4 (L) 04/11/2023 0837   PROT 5.8 (L) 04/11/2023  9162   ALBUMIN 3.6 (L) 04/11/2023 0837   AST 26 04/11/2023 0837   ALT 22 04/11/2023 0837   ALKPHOS 179 (H) 04/11/2023 0837   BILITOT 0.6 04/11/2023 0837   GFR 78.70 07/06/2018 1411   EGFR 105 04/11/2023 0837   GFRNONAA >60 05/17/2021 1547     Diabetic Labs (most recent): Lab Results  Component Value Date   HGBA1C 8.8 (A) 07/24/2023   HGBA1C 9.6 (H) 04/11/2023   HGBA1C 10.2 (H) 12/23/2022     Lipid Panel ( most recent) Lipid Panel     Component Value Date/Time   CHOL 111 04/11/2023 0837   TRIG 58 04/11/2023 0837   HDL 46 04/11/2023 0837   CHOLHDL 2.4 04/11/2023 0837   LDLCALC 52 04/11/2023 0837   LABVLDL 13 04/11/2023 0837      Lab Results  Component Value Date   TSH 3.240 04/11/2023   TSH 2.450 04/22/2022   TSH 2.600 05/21/2021   TSH 1.890 05/11/2020   TSH 2.040 01/01/2020   TSH 2.290 03/19/2019   TSH 2.230 04/04/2018           Assessment & Plan:   1) Type 1 diabetes mellitus with hyperglycemia (HCC) (Primary)  She presents today for her consultation with her Omnipod 5 G7 combo showing fluctuating glycemic profile, mostly above target.  Her most recent A1c, checked last week was 8.8% improving from last A1c of 9.6%.  She has been using Omnipod 5 for the last year and a half.  She is still learning the specifics of it.  She notes she does not input carbs like the pump says, as it will typically lead to a hypoglycemic event.  Her current settings are basal rate of 0.35-0.4 units per hour; duration of action 2.5 hrs, insulin  to carb ratio 1:15; Correction factor of 30.  She notes her pump loses connection with her Dexcom at times.  She drinks tea and some water , occasionally a soda but due to her hx of gastric bypass does not do this often.  She eats 3 meals per day and tends to snack at night.  She does not engage in any routine physical activity.  She is UTD on eye exam, sees podiatry  routinely.  - Lori Carr has currently uncontrolled symptomatic type 1 DM since 58 years of age, with most recent A1c of 8.8 %.   -Recent labs reviewed.  - I had a long discussion with her about the progressive nature of diabetes and the pathology behind its complications. -her diabetes is complicated by neuropathy and she remains at a high risk for more acute and chronic complications which include CAD, CVA, CKD, retinopathy, and neuropathy. These are all discussed in detail with her.  The following Lifestyle Medicine recommendations according to American College of Lifestyle [employer] [place]) were discussed and offered to patient and she agrees to start the journey:  A. Whole Foods, Plant-based plate comprising of fruits and vegetables, plant-based proteins, whole-grain carbohydrates was discussed in detail with the patient.   A list for source of those nutrients were also provided to the patient.  Patient will use only water  or unsweetened tea for hydration. B.  The need to stay away from risky substances including alcohol, smoking; obtaining 7 to 9 hours of restorative sleep, at least 150 minutes of moderate intensity exercise weekly, the importance of healthy social connections,  and stress reduction techniques were discussed. C.  A full color page of  Calorie density of various food groups per  pound showing examples of each food groups was provided to the patient.  - I have counseled her on diet and weight management by adopting a carbohydrate restricted/protein rich diet. Patient is encouraged to switch to unprocessed or minimally processed complex starch and increased protein intake (animal or plant source), fruits, and vegetables. -  she is advised to stick to a routine mealtimes to eat 3 meals a day and avoid unnecessary snacks (to snack only to correct hypoglycemia).   - she acknowledges that there is a room for improvement in her food and drink choices. - Suggestion is made for her to  avoid simple carbohydrates from her diet including Cakes, Sweet Desserts, Ice Cream, Soda (diet and regular), Sweet Tea, Candies, Chips, Cookies, Store Bought Juices, Alcohol in Excess of 1-2 drinks a day, Artificial Sweeteners, Coffee Creamer, and Sugar-free Products. This will help patient to have more stable blood glucose profile and potentially avoid unintended weight gain.  - I have approached her with the following individualized plan to manage her diabetes and patient agrees:   -I did not make any setting adjustments to her pump today.  Instead we spent the majority of the visit trying to get her connected to our clinic so we can access pump data.  We were unsuccessful in our attempts today.  Will reach out to [facility] team for assistance with this.  At that time, I will review her pump information and call her with any necessary changes.  -she is encouraged to start/continue monitoring glucose 4 times daily, before meals and before bedtime and to reach out to the clinic if she has readings less than 70 or above 300 for 3 tests in a row.  - she is warned not to take insulin  without proper monitoring per orders. - Adjustment parameters are given to her for hypo and hyperglycemia in writing.  -She is not a candidate for non-insulin  therapies given her type 1 diabetes diagnosis.  - Specific targets for  A1c; LDL, HDL, and Triglycerides were discussed with the patient.  2) Blood Pressure /Hypertension:  her blood pressure is controlled to target.   she is advised to continue her current medications as prescribed by her PCP.  3) Lipids/Hyperlipidemia:    Review of her recent lipid panel from 04/11/23 showed controlled LDL at 52 .  she is not currently on any lipid lowering medications.  4)  Weight/Diet:  her Body mass index is 22.45 kg/m.  -   she is NOT a candidate for weight loss.   Exercise, and detailed carbohydrates information provided  -  detailed on discharge instructions.  5)  Chronic Care/Health Maintenance: -she is on ACEI/ARB and not on Statin medications and is encouraged to initiate and continue to follow up with Ophthalmology, Dentist, Podiatrist at least yearly or according to recommendations, and advised to stay away from smoking. I have recommended yearly flu vaccine and pneumonia vaccine at least every 5 years; moderate intensity exercise for up to 150 minutes weekly; and sleep for at least 7 hours a day.  - she is advised to maintain close follow up with Claudene Tanda POUR, PA-C for primary care needs, as well as her other providers for optimal and coordinated care.   - Time spent in this patient care: 60 min, which was spent in counseling her about her diabetes and the rest reviewing her blood glucose logs, discussing her hypoglycemia and hyperglycemia episodes, reviewing her current and previous labs/studies (including abstraction from other facilities) and medications doses and  developing a long term treatment plan based on the latest standards of care/guidelines; and documenting her care.    Please refer to Patient Instructions for Blood Glucose Monitoring and Insulin /Medications Dosing Guide in media tab for additional information. Please also refer to Patient Self Inventory in the Media tab for reviewed elements of pertinent patient history.  Lori Carr participated in the discussions, expressed understanding, and voiced agreement with the above plans.  All questions were answered to her satisfaction. she is encouraged to contact clinic should she have any questions or concerns prior to her return visit.     Follow up plan: - Return in about 3 months (around 10/24/2023) for Diabetes F/U with A1c in office, No previsit labs, Bring meter and logs.    Benton Rio, [facility] [address] [place], [place] [address] Phone: [phone] Fax: [phone]  07/24/2023, 1:02 PM

## 2023-08-10 ENCOUNTER — Other Ambulatory Visit: Payer: Self-pay | Admitting: Physician Assistant

## 2023-08-10 DIAGNOSIS — Z1231 Encounter for screening mammogram for malignant neoplasm of breast: Secondary | ICD-10-CM

## 2023-08-18 DIAGNOSIS — M8438XA Stress fracture, other site, initial encounter for fracture: Secondary | ICD-10-CM | POA: Diagnosis not present

## 2023-08-18 DIAGNOSIS — M25561 Pain in right knee: Secondary | ICD-10-CM | POA: Diagnosis not present

## 2023-08-18 DIAGNOSIS — M2241 Chondromalacia patellae, right knee: Secondary | ICD-10-CM | POA: Diagnosis not present

## 2023-08-18 DIAGNOSIS — E119 Type 2 diabetes mellitus without complications: Secondary | ICD-10-CM | POA: Diagnosis not present

## 2023-08-18 DIAGNOSIS — S8001XA Contusion of right knee, initial encounter: Secondary | ICD-10-CM | POA: Diagnosis not present

## 2023-08-18 DIAGNOSIS — E1110 Type 2 diabetes mellitus with ketoacidosis without coma: Secondary | ICD-10-CM | POA: Diagnosis not present

## 2023-08-21 ENCOUNTER — Other Ambulatory Visit: Payer: Self-pay

## 2023-08-21 DIAGNOSIS — Z9884 Bariatric surgery status: Secondary | ICD-10-CM

## 2023-08-22 ENCOUNTER — Observation Stay
Admission: EM | Admit: 2023-08-22 | Discharge: 2023-08-23 | Disposition: A | Discharge status: Home / Self Care | Attending: Internal Medicine | Admitting: Internal Medicine

## 2023-08-22 ENCOUNTER — Encounter: Payer: Self-pay | Admitting: Emergency Medicine

## 2023-08-22 ENCOUNTER — Other Ambulatory Visit: Payer: Self-pay

## 2023-08-22 DIAGNOSIS — E1010 Type 1 diabetes mellitus with ketoacidosis without coma: Secondary | ICD-10-CM | POA: Diagnosis not present

## 2023-08-22 DIAGNOSIS — K219 Gastro-esophageal reflux disease without esophagitis: Secondary | ICD-10-CM | POA: Diagnosis not present

## 2023-08-22 DIAGNOSIS — E109 Type 1 diabetes mellitus without complications: Secondary | ICD-10-CM | POA: Diagnosis present

## 2023-08-22 DIAGNOSIS — R001 Bradycardia, unspecified: Secondary | ICD-10-CM | POA: Diagnosis not present

## 2023-08-22 DIAGNOSIS — R000 Tachycardia, unspecified: Secondary | ICD-10-CM | POA: Diagnosis not present

## 2023-08-22 DIAGNOSIS — R55 Syncope and collapse: Secondary | ICD-10-CM | POA: Diagnosis present

## 2023-08-22 DIAGNOSIS — I10 Essential (primary) hypertension: Secondary | ICD-10-CM | POA: Diagnosis not present

## 2023-08-22 DIAGNOSIS — R0781 Pleurodynia: Secondary | ICD-10-CM | POA: Diagnosis present

## 2023-08-22 DIAGNOSIS — R739 Hyperglycemia, unspecified: Secondary | ICD-10-CM | POA: Diagnosis present

## 2023-08-22 DIAGNOSIS — E1110 Type 2 diabetes mellitus with ketoacidosis without coma: Secondary | ICD-10-CM | POA: Diagnosis present

## 2023-08-22 DIAGNOSIS — Z9641 Presence of insulin pump (external) (internal): Secondary | ICD-10-CM | POA: Insufficient documentation

## 2023-08-22 DIAGNOSIS — Z794 Long term (current) use of insulin: Secondary | ICD-10-CM | POA: Diagnosis not present

## 2023-08-22 DIAGNOSIS — Z79899 Other long term (current) drug therapy: Secondary | ICD-10-CM | POA: Diagnosis not present

## 2023-08-22 DIAGNOSIS — R112 Nausea with vomiting, unspecified: Secondary | ICD-10-CM | POA: Diagnosis not present

## 2023-08-22 DIAGNOSIS — K2100 Gastro-esophageal reflux disease with esophagitis, without bleeding: Secondary | ICD-10-CM

## 2023-08-22 LAB — COMPREHENSIVE METABOLIC PANEL WITH GFR
ALT: 22 U/L (ref 0–44)
AST: 23 U/L (ref 15–41)
Albumin: 4.3 g/dL (ref 3.5–5.0)
Alkaline Phosphatase: 191 U/L — ABNORMAL HIGH (ref 38–126)
Anion gap: 19 — ABNORMAL HIGH (ref 5–15)
BUN: 22 mg/dL — ABNORMAL HIGH (ref 6–20)
CO2: 21 mmol/L — ABNORMAL LOW (ref 22–32)
Calcium: 9.1 mg/dL (ref 8.9–10.3)
Chloride: 91 mmol/L — ABNORMAL LOW (ref 98–111)
Creatinine, Ser: 0.86 mg/dL (ref 0.44–1.00)
GFR, Estimated: >60 mL/min (ref >60)
Glucose, Bld: 620 mg/dL (ref 70–99)
Potassium: 4.6 mmol/L (ref 3.5–5.1)
Sodium: 131 mmol/L — ABNORMAL LOW (ref 135–145)
Total Bilirubin: 1.7 mg/dL — ABNORMAL HIGH (ref 0.0–1.2)
Total Protein: 7.6 g/dL (ref 6.5–8.1)

## 2023-08-22 LAB — BLOOD GAS, VENOUS
Acid-base deficit: 2 mmol/L (ref 0.0–2.0)
Bicarbonate: 23.1 mmol/L (ref 20.0–28.0)
O2 Saturation: 83.9 %
Patient temperature: 37
pCO2, Ven: 40 mmHg — ABNORMAL LOW (ref 44–60)
pH, Ven: 7.37 (ref 7.25–7.43)
pO2, Ven: 53 mmHg — ABNORMAL HIGH (ref 32–45)

## 2023-08-22 LAB — CBC
HCT: 43.3 % (ref 36.0–46.0)
Hemoglobin: 14.5 g/dL (ref 12.0–15.0)
MCH: 28.4 pg (ref 26.0–34.0)
MCHC: 33.5 g/dL (ref 30.0–36.0)
MCV: 84.9 fL (ref 80.0–100.0)
Platelets: 302 K/uL (ref 150–400)
RBC: 5.1 MIL/uL (ref 3.87–5.11)
RDW: 12.8 % (ref 11.5–15.5)
WBC: 8.8 K/uL (ref 4.0–10.5)
nRBC: 0 % (ref 0.0–0.2)

## 2023-08-22 LAB — CBG MONITORING, ED
Glucose-Capillary: 551 mg/dL (ref 70–99)
Glucose-Capillary: 446 mg/dL — ABNORMAL HIGH (ref 70–99)

## 2023-08-22 LAB — URINALYSIS, ROUTINE W REFLEX MICROSCOPIC
Bacteria, UA: NONE SEEN
Bilirubin Urine: NEGATIVE
Glucose, UA: >=500 mg/dL — AB
Hgb urine dipstick: NEGATIVE
Ketones, ur: 80 mg/dL — AB
Leukocytes,Ua: NEGATIVE
Nitrite: NEGATIVE
Protein, ur: NEGATIVE mg/dL
Specific Gravity, Urine: 1.032 — ABNORMAL HIGH (ref 1.005–1.030)
pH: 5 (ref 5.0–8.0)

## 2023-08-22 MED ORDER — ONDANSETRON HCL 4 MG/2ML IJ SOLN
4.0000 mg | Freq: Three times a day (TID) | INTRAMUSCULAR | Status: DC | PRN
  Administered 2023-08-23: 4 mg via INTRAVENOUS
  Filled 2023-08-22: qty 2

## 2023-08-22 MED ORDER — HYDRALAZINE HCL 20 MG/ML IJ SOLN: 5.0000 mg | INTRAMUSCULAR | Status: DC | PRN

## 2023-08-22 MED ORDER — LACTATED RINGERS IV BOLUS
1000.0000 mL | Freq: Once | INTRAVENOUS | Status: AC
  Administered 2023-08-23: 1000 mL via INTRAVENOUS

## 2023-08-22 MED ORDER — LACTATED RINGERS IV BOLUS
1000.0000 mL | Freq: Once | INTRAVENOUS | Status: AC
  Administered 2023-08-22: 1000 mL via INTRAVENOUS

## 2023-08-22 MED ORDER — DEXTROSE IN LACTATED RINGERS 5 % IV SOLN: INTRAVENOUS | Status: DC

## 2023-08-22 MED ORDER — DEXTROSE 50 % IV SOLN: 0.0000 mL | INTRAVENOUS | Status: DC | PRN

## 2023-08-22 MED ORDER — ACETAMINOPHEN 325 MG PO TABS: 650.0000 mg | ORAL_TABLET | Freq: Four times a day (QID) | ORAL | Status: DC | PRN

## 2023-08-22 MED ORDER — ENOXAPARIN SODIUM 40 MG/0.4ML IJ SOSY
40.0000 mg | PREFILLED_SYRINGE | INTRAMUSCULAR | Status: DC
  Administered 2023-08-23: 40 mg via SUBCUTANEOUS
  Filled 2023-08-22: qty 0.4

## 2023-08-22 MED ORDER — LACTATED RINGERS IV SOLN: INTRAVENOUS | Status: DC

## 2023-08-22 MED ORDER — INSULIN REGULAR(HUMAN) IN NACL 100-0.9 UT/100ML-% IV SOLN
INTRAVENOUS | Status: DC
  Administered 2023-08-22: 7.5 [IU]/h via INTRAVENOUS
  Filled 2023-08-22: qty 100

## 2023-08-22 MED ORDER — INSULIN REGULAR(HUMAN) IN NACL 100-0.9 UT/100ML-% IV SOLN: INTRAVENOUS | Status: DC

## 2023-08-22 MED ORDER — POTASSIUM CHLORIDE 10 MEQ/100ML IV SOLN
10.0000 meq | INTRAVENOUS | Status: AC
  Administered 2023-08-22 – 2023-08-23 (×2): 10 meq via INTRAVENOUS
  Filled 2023-08-22 (×2): qty 100

## 2023-08-22 MED ORDER — ATROPINE SULFATE 1 MG/10ML IJ SOSY: 1.0000 mg | PREFILLED_SYRINGE | INTRAMUSCULAR | Status: DC | PRN

## 2023-08-22 NOTE — ED Triage Notes (Addendum)
 Pt reports high blood sugar readings since last night. Pt sts today she has experienced dry mouth, frequent/constant urination, N/V, and dizziness. Pt was recently hospitalized with DKA. Respirations 23-25, tachycardic 126-130bpm while triaging pt. Pt denies recent changes to insulin  and denies recent illness.

## 2023-08-22 NOTE — ED Provider Notes (Signed)
 [facility] Provider Note    Event Date/Time   First MD Initiated Contact with Patient 08/22/23 2114     (approximate)   History   Chief Complaint Hyperglycemia   HPI  Lori Carr is a 58 y.o. female with past medical history of type 1 diabetes and duodenal switch procedure who presents to the ED complaining of hyperglycemia.  Patient reports that over the past 24 hours her blood sugars have been running high and she has been feeling off similar to prior episodes of DKA.  She reports dry mouth, generalized weakness, malaise, and frequent urination.  She has not had any fevers, dysuria, flank pain, cough, chest pain, or shortness of breath.  She does report feeling nauseous with a few episodes of vomiting, has not had any diarrhea.  She has been using her insulin  pump as prescribed during this time, does admit that she has had a few milkshakes this week and frequently eats pasta and bread.     Physical Exam   Triage Vital Signs: ED Triage Vitals  Encounter Vitals Group     BP 08/22/23 2103 (!) 174/101     Girls Systolic BP Percentile --      Girls Diastolic BP Percentile --      Boys Systolic BP Percentile --      Boys Diastolic BP Percentile --      Pulse Rate 08/22/23 2103 (!) 126     Resp 08/22/23 2103 (!) 23     Temp 08/22/23 2103 98.4 F (36.9 C)     Temp Source 08/22/23 2103 Oral     SpO2 08/22/23 2103 99 %     Weight 08/22/23 2104 120 lb (54.4 kg)     Height 08/22/23 2104 5' 1 (1.549 m)     Head Circumference --      Peak Flow --      Pain Score 08/22/23 2104 0     Pain Loc --      Pain Education --      Exclude from Growth Chart --     Most recent vital signs: Vitals:   08/22/23 2230 08/22/23 2300  BP: (!) 141/83 (!) 143/88  Pulse: (!) 102 (!) 101  Resp: 19 17  Temp:    SpO2: 98% 100%    Constitutional: Alert and oriented. Eyes: Conjunctivae are normal. Head: Atraumatic. Nose: No congestion/rhinnorhea. Mouth/Throat:  Mucous membranes are dry. Cardiovascular: Tachycardic, regular rhythm. Grossly normal heart sounds.  2+ radial pulses bilaterally. Respiratory: Normal respiratory effort.  No retractions. Lungs CTAB. Gastrointestinal: Soft and nontender. No distention. Musculoskeletal: No lower extremity tenderness nor edema.  Neurologic:  Normal speech and language. No gross focal neurologic deficits are appreciated.    ED Results / Procedures / Treatments   Labs (all labs ordered are listed, but only abnormal results are displayed) Labs Reviewed  URINALYSIS, ROUTINE W REFLEX MICROSCOPIC - Abnormal; Notable for the following components:      Result Value   Color, Urine STRAW (*)    APPearance CLEAR (*)    Specific Gravity, Urine 1.032 (*)    Glucose, UA >=500 (*)    Ketones, ur 80 (*)    All other components within normal limits  COMPREHENSIVE METABOLIC PANEL WITH GFR - Abnormal; Notable for the following components:   Sodium 131 (*)    Chloride 91 (*)    CO2 21 (*)    Glucose, Bld 620 (*)    BUN 22 (*)  Alkaline Phosphatase 191 (*)    Total Bilirubin 1.7 (*)    Anion gap 19 (*)    All other components within normal limits  BLOOD GAS, VENOUS - Abnormal; Notable for the following components:   pCO2, Ven 40 (*)    pO2, Ven 53 (*)    All other components within normal limits  CBG MONITORING, ED - Abnormal; Notable for the following components:   Glucose-Capillary 551 (*)    All other components within normal limits  CBC  BETA-HYDROXYBUTYRIC ACID  BETA-HYDROXYBUTYRIC ACID  BETA-HYDROXYBUTYRIC ACID  BETA-HYDROXYBUTYRIC ACID  CBG MONITORING, ED  CBG MONITORING, ED     EKG  ED ECG REPORT I, Carlin Palin, the attending physician, personally viewed and interpreted this ECG.   Date: 08/22/2023  EKG Time: 21:06  Rate: 113  Rhythm: sinus tachycardia  Axis: Normal  Intervals:none  ST&T Change: None  PROCEDURES:  Critical Care performed: No  .Critical Care  Performed by:  Palin Carlin, MD Authorized by: Palin Carlin, MD   Critical care provider statement:    Critical care time (minutes):  30   Critical care time was exclusive of:  Separately billable procedures and treating other patients and teaching time   Critical care was necessary to treat or prevent imminent or life-threatening deterioration of the following conditions:  Endocrine crisis and metabolic crisis   Critical care was time spent personally by me on the following activities:  Development of treatment plan with patient or surrogate, discussions with consultants, evaluation of patient's response to treatment, examination of patient, ordering and review of laboratory studies, ordering and review of radiographic studies, ordering and performing treatments and interventions, pulse oximetry, re-evaluation of patient's condition and review of old charts   I assumed direction of critical care for this patient from another provider in my specialty: no     Care discussed with: admitting provider      MEDICATIONS ORDERED IN ED: Medications  insulin  regular, human (MYXREDLIN ) 100 units/ 100 mL infusion (has no administration in time range)  lactated ringers  infusion (has no administration in time range)  dextrose  5 % in lactated ringers  infusion (has no administration in time range)  dextrose  50 % solution 0-50 mL (has no administration in time range)  potassium chloride  10 mEq in 100 mL IVPB (has no administration in time range)  lactated ringers  bolus 1,000 mL (0 mLs Intravenous Stopped 08/22/23 2305)     IMPRESSION / MDM / ASSESSMENT AND PLAN / ED COURSE  I reviewed the triage vital signs and the nursing notes.                              58 y.o. female with past medical history of type 1 diabetes and duodenal switch procedure who presents to the ED complaining of 24 hours of malaise, nausea, vomiting, urinary frequency, and generalized weakness.  Patient's presentation is most consistent  with acute presentation with potential threat to life or bodily function.  Differential diagnosis includes, but is not limited to, hyperglycemia, DKA, HHS, AKI, electrolyte abnormality, UTI.  Patient nontoxic-appearing and in no acute distress, vital signs markable for tachycardia and mild tachypnea.  CBG in triage is elevated, will further assess for DKA with CMP, VBG, and beta hydroxybutyrate.  For now, will give bolus of lactated Ringer 's, urinalysis pending.  VBG is reassuring with pH of 7.37, but CMP is concerning for early DKA with glucose of 620, anion gap of  19, and bicarb of 21.  No significant anemia or leukocytosis noted and I doubt infectious process, LFTs are unremarkable.  We will start patient on insulin  drip and case discussed with hospitalist for admission.      FINAL CLINICAL IMPRESSION(S) / ED DIAGNOSES   Final diagnoses:  Diabetic ketoacidosis without coma associated with type 1 diabetes mellitus (HCC)     Rx / DC Orders   ED Discharge Orders     None        Note:  This document was prepared using Dragon voice recognition software and may include unintentional dictation errors.   Willo Dunnings, MD 08/22/23 2312

## 2023-08-22 NOTE — H&P (Incomplete)
 History and Physical    Lori Carr FMW:[id number] DOB: 08/06/1965 DOA: 08/22/2023  Referring MD/NP/PA:   PCP: Claudene Tanda POUR, PA-C   Patient coming from:  The patient is coming from home.     Chief Complaint: Elevated blood sugar, polyuria, polydipsia, syncope  HPI: Lori Carr is a 58 y.o. female with medical history significant of poorly controlled type 1 diabetes on insulin  pump, DKA, hypertension, hyperlipidemia, GERD, anxiety, who presents with elevated blood sugar, polyuria, polydipsia, and syncope.  Patient states that she has polyuria, polydipsia, generalized weakness in the past several days.  Her blood sugar has been elevated.  She has an insulin  pump, but she is not sure if the pump is working appropriately. Patient did not have nausea and vomiting at home, but developed nausea and vomiting in the ED, no diarrhea or abdominal pain.  No fever or chills.  Patient does not have chest pain, cough, SOB.  No dysuria or hematuria.  She states that she injured her left rib cage on track 3 weeks ago, she still has some pain in her left rib cage.   Per RN report, pt passed out twice in ED when RN was helping her to go to bathroom.  Patient has been tachycardic in ED with heart rate around 120s.  At that time she passed out, her heart rate dropped to 38, then come back to 50, then up to 150s, then down to 110-120s.  Blood pressure dropped to 86/55, then came back to be hypertensive.  Stat EKG showed sinus rhythm with heart rate 60. When I saw pt in ED, her heart rate is at her 120s, recent SBP 150-160s.    She denies dizziness, unilateral numbness or tingling in extremities.  No facial droop or slurred speech.  No chest pain or SOB.  Data reviewed independently and ED Course: pt was found to have DKA (blood sugar 620, anion gap 19, bicarbonate of 21, pH 7.37 on VBG, ketone 80 urine and beta hydroxybutyric acid 3.69), UA negative for UTI, WBC 8.8, GFR > 60, temperature normal, RR 28, oxygen  saturation 100% on room air.  Patient is placed in stepdown for observation.   EKG: I have personally reviewed.  Initial EKG showed sinus rhythm, QTc 455, LAE, LAD, poor R progression, anteroseptal infarction pattern, heart rate is 113.   Review of Systems:   General: no fevers, chills, no body weight gain, has poor appetite, has fatigue HEENT: no blurry vision, hearing changes or sore throat Respiratory: no dyspnea, coughing, wheezing CV: no chest pain, no palpitations GI: has nausea, vomiting, no abdominal pain, diarrhea, constipation GU: no dysuria, burning on urination, has increased urinary frequency Ext: no leg edema Neuro: no unilateral weakness, numbness, or tingling, no vision change or hearing loss. Has syncope Skin: no rash, no skin tear. MSK: No muscle spasm, no deformity, no limitation of range of movement in spin. Has left rib cage pain Heme: No easy bruising.  Travel history: No recent long distant travel.   Allergy:  Allergies  Allergen Reactions   Amoxicillin-Pot Clavulanate Itching    Did it involve swelling of the face/tongue/throat, SOB, or low BP? No Did it involve sudden or severe rash/hives, skin peeling, or any reaction on the inside of your mouth or nose? No Did you need to seek medical attention at a hospital or doctor's office? No When did it last happen?  A long time ago     If all above answers are "NO", may  proceed with cephalosporin use.   Hydrocodone -Acetaminophen  Nausea Only   Vicodin [Hydrocodone -Acetaminophen ] Nausea Only    Past Medical History:  Diagnosis Date   chronic vaginal bleeding    Diabetes mellitus    type 2   DKA (diabetic ketoacidoses)    History of Elevated LDL cholesterol level    Obesity     Past Surgical History:  Procedure Laterality Date   BREAST BIOPSY Right 09/02/2022   MM RT BREAST BX W LOC DEV 1ST LESION IMAGE BX SPEC STEREO GUIDE 09/02/2022 [facility]   CESAREAN SECTION     x3   COLONOSCOPY  2018    COLONOSCOPY WITH PROPOFOL  N/A 06/14/2022   Procedure: COLONOSCOPY WITH PROPOFOL ;  Surgeon: Maryruth Ole DASEN, MD;  Location: [facility] END[facility];  Service: Endoscopy;  Laterality: N/A;   DILITATION & CURRETTAGE/HYSTROSCOPY WITH NOVASURE ABLATION N/A 07/03/2018   Procedure: DILATATION & CURETTAGE/HYSTEROSCOPY WITH Possible NOVASURE ABLATION;  Surgeon: Gorge Ade, MD;  Location: [facility] [place];  Service: Gynecology;  Laterality: N/A;   LAPAROSCOPIC GASTRIC RESTRICTIVE DUODENAL PROCEDURE (DUODENAL SWITCH) N/A 07/11/2019   tissue removed from stomach  as child   TYMPANOSTOMY TUBE PLACEMENT  as child   WRIST SURGERY Right 03/29/2019    Social History:  reports that she has never smoked. She has never used smokeless tobacco. She reports that she does not drink alcohol and does not use drugs.  Family History:  Family History  Problem Relation Age of Onset   Ovarian cancer Mother 50       deceased age 76 stage IV ovarian cancer   Breast cancer Maternal Aunt 40   Breast cancer Paternal Grandmother 49   Breast cancer Paternal Aunt 96   Stroke Brother    Diabetes Mellitus I Brother      Prior to Admission medications   Medication Sig Start Date End Date Taking? Authorizing Provider  alendronate (FOSAMAX) 70 MG tablet Take 70 mg by mouth once a week.    [provider]  benzocaine  (ORABASE-B) 20 % PSTE Use as directed 1 Application in the mouth or throat 4 (four) times daily as needed for mouth pain. Patient not taking: Reported on 07/24/2023 08/31/22   Claudene Tanda POUR, PA-C  Continuous Blood Gluc Sensor (DEXCOM G6 SENSOR) MISC APPLY 1 SENSOR TO SKIN EVERY 10 DAYS Patient not taking: Reported on 07/24/2023 03/06/22   Shamleffer, Donell Cardinal, MD  Continuous Blood Gluc Transmit (DEXCOM G6 TRANSMITTER) MISC Change every 90 day Patient not taking: Reported on 07/24/2023 04/19/22   Shamleffer, Donell Cardinal, MD  Continuous Glucose Sensor (DEXCOM G7 SENSOR) MISC by Does not apply  route as directed.    [provider]  glucose blood (ONETOUCH VERIO) test strip Use as instructed 07/30/19   Claudene Tanda POUR, PA-C  insulin  aspart (NOVOLOG ) 100 UNIT/ML injection USE AS DIRECTED (20 UNITS MAX DAILY) AS NEEDED 05/08/22   Shamleffer, Donell Cardinal, MD  Insulin  Disposable Pump (OMNIPOD 5 G6 PODS, GEN 5,) MISC APPLY 1 POD AS DIRECTED AND REPLACE POD EVERY 72 HOURS. 07/21/22   Shamleffer, Ibtehal Jaralla, MD  Insulin  Pen Needle 32G X 4 MM MISC 1 Device by Does not apply route in the morning, at noon, in the evening, and at bedtime. 07/23/19   Shamleffer, Ibtehal Jaralla, MD  Lancets MISC Use as instructed 07/29/19   Saunders Shona CROME, PA-C  lidocaine  (XYLOCAINE ) 2 % solution Use as directed 5 mLs in the mouth or throat every 6 (six) hours as needed for mouth pain. Mix  with 5 ml of Phenergan  DM for swish and swallow 08/31/22   Claudene Tanda POUR, PA-C  lisinopril  (ZESTRIL ) 10 MG tablet Take 1 tablet (10 mg total) by mouth daily. 11/10/20   Claudene Tanda POUR, PA-C  meloxicam  (MOBIC ) 15 MG tablet Take 15 mg by mouth daily. 04/17/23   [provider]  methylPREDNISolone  (MEDROL  DOSEPAK) 4 MG TBPK tablet Take Tapered dose as directed Patient not taking: Reported on 07/24/2023 08/31/22   Claudene Tanda POUR, PA-C  OVER THE COUNTER MEDICATION Take by mouth daily. Patient reports that she takes  ADEK vitamin    [provider]  pantoprazole  (PROTONIX ) 40 MG tablet Take 1 tablet by mouth daily. 03/23/20   [provider]  potassium chloride  SA (KLOR-CON  M) 20 MEQ tablet TAKE 1 TABLET BY MOUTH DAILY. 06/20/23   Claudene Tanda POUR, PA-C  promethazine -dextromethorphan (PROMETHAZINE -DM) 6.25-15 MG/5ML syrup Take 5 mLs by mouth 4 (four) times daily as needed for cough. Mix with 5 mL of viscous lidocaine  for swish and swallow Patient not taking: Reported on 07/24/2023 08/31/22   Claudene Tanda POUR, PA-C  traMADol  (ULTRAM ) 50 MG tablet Take 50 mg by mouth every 6 (six) hours as needed.     [provider]  ULTICARE MINI PEN NEEDLES 31G X 6 MM MISC USE AS DIRECTED FOUR TIMES DAILY 05/16/20   Shamleffer, Donell Cardinal, MD  Vitamin D , Ergocalciferol , (DRISDOL ) 1.25 MG (50000 UNIT) CAPS capsule Take 1 capsule (50,000 Units total) by mouth every 7 (seven) days. Patient not taking: Reported on 07/24/2023 05/02/22   Claudene Tanda POUR, PA-C    Physical Exam: Vitals:   08/23/23 0030 08/23/23 0100 08/23/23 0115 08/23/23 0130  BP: (!) 157/90 139/79 111/65 114/70  Pulse: (!) 114 97 90 94  Resp: 18     Temp:      TempSrc:      SpO2: 100% 99% 99% 99%  Weight:      Height:       General: Not in acute distress HEENT:       Eyes: PERRL, EOMI, no jaundice       ENT: No discharge from the ears and nose, no pharynx injection, no tonsillar enlargement.        Neck: No JVD, no bruit, no mass felt. Heme: No neck lymph node enlargement. Cardiac: S1/S2, RRR, No murmurs, No gallops or rubs. Respiratory: No rales, wheezing, rhonchi or rubs. GI: Soft, nondistended, nontender, no rebound pain, no organomegaly, BS present. GU: No hematuria Ext: No pitting leg edema bilaterally. 1+DP/PT pulse bilaterally. Musculoskeletal: No joint deformities, No joint redness or warmth, no limitation of ROM in spin. Has left rib cage tenderness Skin: No rashes.  Neuro: Alert, oriented X3, cranial nerves II-XII grossly intact, moves all extremities normally. Muscle strength 5/5 in all extremities, sensation to light touch intact.  Psych: Patient is not psychotic, no suicidal or hemocidal ideation.  Labs on Admission: I have personally reviewed following labs and imaging studies  CBC: Recent Labs  Lab 08/22/23 2115  WBC 8.8  HGB 14.5  HCT 43.3  MCV 84.9  PLT 302   Basic Metabolic Panel: Recent Labs  Lab 08/22/23 2115 08/23/23 0031  NA 131* 132*  K 4.6 3.2*  CL 91* 102  CO2 21* 21*  GLUCOSE 620* 340*  BUN 22* 19  CREATININE 0.86 0.76  CALCIUM  9.1 8.1*   GFR: Estimated Creatinine  Clearance: 58.5 mL/min (by C-G formula based on SCr of 0.76 mg/dL). Liver Function Tests: Recent Labs  Lab 08/22/23 2115  AST 23  ALT 22  ALKPHOS 191*  BILITOT 1.7*  PROT 7.6  ALBUMIN 4.3   No results for input(s): LIPASE, AMYLASE in the last 168 hours. No results for input(s): AMMONIA in the last 168 hours. Coagulation Profile: No results for input(s): INR, PROTIME in the last 168 hours. Cardiac Enzymes: No results for input(s): CKTOTAL, CKMB, CKMBINDEX, TROPONINI in the last 168 hours. BNP (last 3 results) No results for input(s): PROBNP in the last 8760 hours. HbA1C: No results for input(s): HGBA1C in the last 72 hours. CBG: Recent Labs  Lab 08/22/23 2101 08/22/23 2324 08/23/23 0015 08/23/23 0137  GLUCAP 551* 446* 359* 245*   Lipid Profile: No results for input(s): CHOL, HDL, LDLCALC, TRIG, CHOLHDL, LDLDIRECT in the last 72 hours. Thyroid  Function Tests: No results for input(s): TSH, T4TOTAL, FREET4, T3FREE, THYROIDAB in the last 72 hours. Anemia Panel: No results for input(s): VITAMINB12, FOLATE, FERRITIN, TIBC, IRON, RETICCTPCT in the last 72 hours. Urine analysis:    Component Value Date/Time   COLORURINE STRAW (A) 08/22/2023 2137   APPEARANCEUR CLEAR (A) 08/22/2023 2137   LABSPEC 1.032 (H) 08/22/2023 2137   PHURINE 5.0 08/22/2023 2137   GLUCOSEU >=500 (A) 08/22/2023 2137   HGBUR NEGATIVE 08/22/2023 2137   BILIRUBINUR NEGATIVE 08/22/2023 2137   BILIRUBINUR neg 04/11/2023 0857   KETONESUR 80 (A) 08/22/2023 2137   PROTEINUR NEGATIVE 08/22/2023 2137   UROBILINOGEN 0.2 04/11/2023 0857   NITRITE NEGATIVE 08/22/2023 2137   LEUKOCYTESUR NEGATIVE 08/22/2023 2137   Sepsis Labs: @LABRCNTIP (procalcitonin:4,lacticidven:4) )No results found for this or any previous visit (from the past 240 hours).   Radiological Exams on Admission:   Assessment/Plan Principal Problem:   DKA (diabetic ketoacidosis)  (HCC) Active Problems:   Type 1 diabetes mellitus without complication (HCC)   Nausea & vomiting   HTN (hypertension)   GERD (gastroesophageal reflux disease)   Syncope   Rib pain on left side   Assessment and Plan:   DKA (diabetic ketoacidosis) (HCC): blood sugar 620, anion gap 19, bicarbonate of 21, pH 7.37 on VBG, ketone 80 urine and beta hydroxybutyric acid 3.69. her insulin  pump may have malfunction, will consult diabetic educator.  - Place in SDU for obs - IVF:  2L of LR bolus - start insulin  drip with BMP q4h - IVF: LR at 125 cc/h, will switch to D5-LR at 125 cc/h when CBG<250 - replete K as needed - NPO  - consult to diabetic educator  Type 1 diabetes mellitus without complication (HCC): Recent A1c 8.8, poorly controlled. - Diabetic educator consult  Nausea, vomiting: No abdominal pain, diarrhea.  No fever or chills.  Possibly due to gastroparesis secondary to poorly controlled diabetes. - Reglan  5 mg 3 times daily - As needed Zofran  - IV fluid as above  HTN (hypertension) -Hold her lisinopril  due to hypotension - IV hydralazine  as needed  GERD (gastroesophageal reflux disease) -Protonix   Syncope: Possibly due to vasovagal syncope.  Patient had bradycardia, hypotension during the event.  No focal neurodeficit on physical examination.  Her heart rate quickly comes back to be tachycardic and blood pressure comes back to be hypertensive.  Will hold images now. - Check orthostatic vital signs - IV fluid as above - Neurocheck frequently - Fall precaution - As needed atropine  if developed bradycardia again - Check troponin x 2  Rib pain on left side - As needed Tylenol , fentanyl   - Lidoderm  patch -Incentive spirometry - Follow-up x-ray      DVT ppx:  SQ Lovenox   Code Status: Full code     Family Communication:   Yes, patient's husband at bed side.     Disposition Plan:  Anticipate discharge back to previous environment  Consults called:  None  Admission status and Level of care: Stepdown:    for obs    Dispo: The patient is from: Home              Anticipated d/c is to: Home              Anticipated d/c date is: 1 day              Patient currently is not medically stable to d/c.    Severity of Illness:  The appropriate patient status for this patient is OBSERVATION. Observation status is judged to be reasonable and necessary in order to provide the required intensity of service to ensure the patient's safety. The patient's presenting symptoms, physical exam findings, and initial radiographic and laboratory data in the context of their medical condition is felt to place them at decreased risk for further clinical deterioration. Furthermore, it is anticipated that the patient will be medically stable for discharge from the hospital within 2 midnights of admission.        Date of Service 08/23/2023    Caleb Exon [facility]   If 7PM-7AM, please contact night-coverage [email or web address] 08/23/2023, 1:56 AM

## 2023-08-22 NOTE — ED Notes (Signed)
 This RN and Nidia, RN were at bedside when [facility] [place] was starting an IV the patient stated that she felt like she was going to pass out. The patient BP and HR dropped and the patient lost consciousness, but quickly regained consciousness after 10 second or so. The patient and husband stated that the patient sometimes passes out with blood draws and in general does not do well with IV or sticks. Patient is alert and oriented, but HR remains brady in the 50's. Will notify provider.

## 2023-08-23 ENCOUNTER — Observation Stay

## 2023-08-23 DIAGNOSIS — E109 Type 1 diabetes mellitus without complications: Secondary | ICD-10-CM | POA: Diagnosis not present

## 2023-08-23 DIAGNOSIS — E1010 Type 1 diabetes mellitus with ketoacidosis without coma: Secondary | ICD-10-CM | POA: Diagnosis not present

## 2023-08-23 DIAGNOSIS — R0781 Pleurodynia: Secondary | ICD-10-CM | POA: Diagnosis present

## 2023-08-23 DIAGNOSIS — R55 Syncope and collapse: Secondary | ICD-10-CM | POA: Diagnosis not present

## 2023-08-23 DIAGNOSIS — R112 Nausea with vomiting, unspecified: Secondary | ICD-10-CM | POA: Diagnosis present

## 2023-08-23 DIAGNOSIS — I10 Essential (primary) hypertension: Secondary | ICD-10-CM | POA: Diagnosis not present

## 2023-08-23 LAB — CBG MONITORING, ED
Glucose-Capillary: 133 mg/dL — ABNORMAL HIGH (ref 70–99)
Glucose-Capillary: 133 mg/dL — ABNORMAL HIGH (ref 70–99)
Glucose-Capillary: 142 mg/dL — ABNORMAL HIGH (ref 70–99)
Glucose-Capillary: 150 mg/dL — ABNORMAL HIGH (ref 70–99)
Glucose-Capillary: 186 mg/dL — ABNORMAL HIGH (ref 70–99)
Glucose-Capillary: 186 mg/dL — ABNORMAL HIGH (ref 70–99)
Glucose-Capillary: 245 mg/dL — ABNORMAL HIGH (ref 70–99)
Glucose-Capillary: 359 mg/dL — ABNORMAL HIGH (ref 70–99)

## 2023-08-23 LAB — BASIC METABOLIC PANEL WITH GFR
Anion gap: 9 (ref 5–15)
Anion gap: 4 — ABNORMAL LOW (ref 5–15)
Anion gap: 12 (ref 5–15)
BUN: 19 mg/dL (ref 6–20)
BUN: 17 mg/dL (ref 6–20)
BUN: 15 mg/dL (ref 6–20)
CO2: 21 mmol/L — ABNORMAL LOW (ref 22–32)
CO2: 28 mmol/L (ref 22–32)
CO2: 25 mmol/L (ref 22–32)
Calcium: 8.1 mg/dL — ABNORMAL LOW (ref 8.9–10.3)
Calcium: 8 mg/dL — ABNORMAL LOW (ref 8.9–10.3)
Calcium: 8.2 mg/dL — ABNORMAL LOW (ref 8.9–10.3)
Chloride: 102 mmol/L (ref 98–111)
Chloride: 104 mmol/L (ref 98–111)
Chloride: 100 mmol/L (ref 98–111)
Creatinine, Ser: 0.76 mg/dL (ref 0.44–1.00)
Creatinine, Ser: 0.57 mg/dL (ref 0.44–1.00)
Creatinine, Ser: 0.61 mg/dL (ref 0.44–1.00)
GFR, Estimated: >60 mL/min (ref >60)
GFR, Estimated: >60 mL/min (ref >60)
GFR, Estimated: >60 mL/min (ref >60)
Glucose, Bld: 340 mg/dL — ABNORMAL HIGH (ref 70–99)
Glucose, Bld: 192 mg/dL — ABNORMAL HIGH (ref 70–99)
Glucose, Bld: 147 mg/dL — ABNORMAL HIGH (ref 70–99)
Potassium: 3.2 mmol/L — ABNORMAL LOW (ref 3.5–5.1)
Potassium: 4.4 mmol/L (ref 3.5–5.1)
Potassium: 4.5 mmol/L (ref 3.5–5.1)
Sodium: 132 mmol/L — ABNORMAL LOW (ref 135–145)
Sodium: 136 mmol/L (ref 135–145)
Sodium: 137 mmol/L (ref 135–145)

## 2023-08-23 LAB — BETA-HYDROXYBUTYRIC ACID: Beta-Hydroxybutyric Acid: 3.69 mmol/L — ABNORMAL HIGH (ref 0.05–0.27)

## 2023-08-23 LAB — HIV ANTIBODY (ROUTINE TESTING W REFLEX): HIV Screen 4th Generation wRfx: NONREACTIVE

## 2023-08-23 LAB — TROPONIN I (HIGH SENSITIVITY)
Troponin I (High Sensitivity): 23 ng/L — ABNORMAL HIGH (ref <18)
Troponin I (High Sensitivity): 23 ng/L — ABNORMAL HIGH (ref <18)

## 2023-08-23 MED ORDER — METOCLOPRAMIDE HCL 5 MG/ML IJ SOLN: 5.0000 mg | Freq: Three times a day (TID) | INTRAMUSCULAR | Status: DC

## 2023-08-23 MED ORDER — POTASSIUM CHLORIDE 20 MEQ PO PACK: 20.0000 meq | PACK | Freq: Every day | ORAL | 0 refills | Status: AC

## 2023-08-23 MED ORDER — FENTANYL CITRATE PF 50 MCG/ML IJ SOSY: 25.0000 ug | PREFILLED_SYRINGE | INTRAMUSCULAR | Status: DC | PRN

## 2023-08-23 MED ORDER — INSULIN ASPART 100 UNIT/ML IJ SOLN
0.0000 [IU] | Freq: Three times a day (TID) | INTRAMUSCULAR | Status: DC
  Administered 2023-08-23: 1 [IU] via SUBCUTANEOUS
  Filled 2023-08-23: qty 1

## 2023-08-23 MED ORDER — INSULIN ASPART 100 UNIT/ML IJ SOLN: 0.0000 [IU] | Freq: Every day | INTRAMUSCULAR | Status: DC

## 2023-08-23 MED ORDER — LIDOCAINE 5 % EX PTCH: 1.0000 | MEDICATED_PATCH | Freq: Every day | CUTANEOUS | Status: DC

## 2023-08-23 MED ORDER — INSULIN GLARGINE-YFGN 100 UNIT/ML ~~LOC~~ SOLN
12.0000 [IU] | Freq: Every day | SUBCUTANEOUS | Status: DC
  Administered 2023-08-23: 12 [IU] via SUBCUTANEOUS
  Filled 2023-08-23: qty 0.12

## 2023-08-23 MED ORDER — PANTOPRAZOLE SODIUM 40 MG PO TBEC
40.0000 mg | DELAYED_RELEASE_TABLET | Freq: Every day | ORAL | Status: DC
  Administered 2023-08-23: 40 mg via ORAL
  Filled 2023-08-23: qty 1

## 2023-08-23 NOTE — ED Notes (Signed)
 Pt refused blood work/ pt is being discharged

## 2023-08-23 NOTE — Inpatient Diabetes Management (Signed)
 Inpatient Diabetes Program Recommendations  AACE/ADA: New Consensus Statement on Inpatient Glycemic Control (2015)  Target Ranges:  Prepandial:   less than 140 mg/dL      Peak postprandial:   less than 180 mg/dL (1-2 hours)      Critically ill patients:  140 - 180 mg/dL   Lab Results  Component Value Date   GLUCAP 142 (H) 08/23/2023   HGBA1C 8.8 (A) 07/24/2023    Review of Glycemic Control  Latest Reference Range & Units 08/23/23 02:39 08/23/23 03:40 08/23/23 04:45 08/23/23 06:16 08/23/23 07:05  Glucose-Capillary 70 - 99 mg/dL 813 (H) 866 (H) 866 (H) 150 (H) 142 (H)   Diabetes history: DM  Outpatient Diabetes medications:  Dexcom G7 Omnipod insulin  pump -Her current pump settings are basal rate of 0.35-0.4 units per hour; duration of action 2.5 hrs, insulin  to carb ratio 1:15; Correction factor of 30. She notes her pump loses connection with her Dexcom at times.   Current orders for Inpatient glycemic control:  Novolog  0-9 units tid with with meals and HS Semglee  12 units daily Inpatient Diabetes Program Recommendations:    Note patient came in with DKA and insulin  pump. Unsure what happened with insulin  pump. Patient transitioned off insulin  drip overnight to Semglee  12 units and Novolog  correction sensitive tid with meals and HS.  Attempted to call patient.  RN states that she will call me back with ED phone so I can talk with patient.   Thanks,  Randall Bullocks, RN, BC-ADM Inpatient Diabetes Coordinator Pager [phone]  (8a-5p)

## 2023-08-23 NOTE — ED Notes (Signed)
 This RN was at patients bedside when she stated that she had to use the restroom. This RN advised her that we need to do a bedpan since she lost consciousness around 0030 and this RN did not feel comfortable getting this patient up. Patient refused a bedpan and stated she felt good and she was getting up to the toilet. This RN remained at bedside and attempted to educate patient. Despite this writers efforts, the patient insisted on getting up and not using a bedpan and attempted to ambulate to the toilet in the room. After several steps the patient stated that she felt like she was going to pass out, so this RN sat the patient down in a chair with husband to assist and the patient immediately lost consciousness. The patient was unconscious for approximately 30-60 seconds this time. This RN notified charge and got other staff in the room to assist getting the patient back in bed. VS were obtained and Dr. Hilma notified and on his way to bedside.

## 2023-08-23 NOTE — Discharge Summary (Addendum)
 Physician Discharge Summary   Patient: Lori Carr MRN: [id number] DOB: 16-Sep-1965  Admit date:     08/22/2023  Discharge date: 08/23/23  Discharge Physician: Carliss LELON Canales   PCP: Claudene Tanda POUR, PA-C   Recommendations at discharge:    Pt to be discharged home.   If you experience worsening fever, chills, chest pain, shortness of breath, or other concerning symptoms, please call your PCP or go to the emergency department immediately.  Discharge Diagnoses: Principal Problem:   DKA (diabetic ketoacidosis) (HCC) Active Problems:   Type 1 diabetes mellitus without complication (HCC)   Nausea & vomiting   HTN (hypertension)   GERD (gastroesophageal reflux disease)   Syncope   Rib pain on left side  Resolved Problems:   * No resolved hospital problems. *   Hospital Course:  Lori Carr is a 58 y.o. female with medical history significant of poorly controlled type 1 diabetes on insulin  pump, DKA, hypertension, hyperlipidemia, GERD, anxiety, who presents with elevated blood sugar, polyuria, polydipsia, and syncope.   Patient states that she has polyuria, polydipsia, generalized weakness in the past several days.  Her blood sugar has been elevated.  She has an insulin  pump, but she is not sure if the pump is working appropriately. Patient did not have nausea and vomiting at home, but developed nausea and vomiting in the ED, no diarrhea or abdominal pain.  No fever or chills.  Patient does not have chest pain, cough, SOB.  No dysuria or hematuria.  She states that she injured her left rib cage on track 3 weeks ago, she still has some pain in her left rib cage.    Per RN report, pt passed out twice in ED when RN was helping her to go to bathroom.  Patient has been tachycardic in ED with heart rate around 120s.  At that time she passed out, her heart rate dropped to 38, then come back to 50, then up to 150s, then down to 110-120s.  Blood pressure dropped to 86/55, then came back to be  hypertensive.  Stat EKG showed sinus rhythm with heart rate 60. When I saw pt in ED, her heart rate is at her 120s, recent SBP 150-160s.    She denies dizziness, unilateral numbness or tingling in extremities.  No facial droop or slurred speech.  No chest pain or SOB.   Data reviewed independently and ED Course: pt was found to have DKA (blood sugar 620, anion gap 19, bicarbonate of 21, pH 7.37 on VBG, ketone 80 urine and beta hydroxybutyric acid 3.69), UA negative for UTI, WBC 8.8, GFR > 60, temperature normal, RR 28, oxygen saturation 100% on room air.  Patient is placed in stepdown for observation.  Assessment and Plan:  Diabetic ketoacidosis - Glucose 620, anion gap 19, pH 7.37, elevated ketones and beta hydroxybutyrate acid on presentation.  Etiology secondary to OmniPod/insulin  pump malfunction.  Initially placed on IV insulin /DKA protocol.  Throughout the course hospital stay acidemia and glucose quickly corrected.  Transition over to subcu insulin  with insulin  sliding scale.  Patient was educated on replacing her OmniPod.  Patient has all diabetic supplies at home and follows closely with her diabetic regiment.  Type 1 diabetes mellitus, uncontrolled - A1c 8.8 suggesting only moderate control.  Recently initiated on OmniPod.  Follows closely in the outpatient setting.  Resume home regimen.  Tractable nausea vomiting - Secondary to above.  Resolved at this time.  Patient tolerating advancement of diet.  Syncope -  Likely vasovagal or orthostatic in setting of dehydration.  Appears to be resolved.    Consultants: None Procedures performed:  Disposition: Home Diet recommendation:  Discharge Diet Orders (From admission, onward)     Start     Ordered   08/23/23 0000  Diet Carb Modified        08/23/23 1050           Cardiac and Carb modified diet  DISCHARGE MEDICATION: Allergies as of 08/23/2023       Reactions   Amoxicillin-pot Clavulanate Itching   Did it involve  swelling of the face/tongue/throat, SOB, or low BP? No Did it involve sudden or severe rash/hives, skin peeling, or any reaction on the inside of your mouth or nose? No Did you need to seek medical attention at a hospital or doctor's office? No When did it last happen?  A long time ago     If all above answers are "NO", may proceed with cephalosporin use.   Hydrocodone -acetaminophen  Nausea Only   Vicodin [hydrocodone -acetaminophen ] Nausea Only        Medication List     TAKE these medications    alendronate 70 MG tablet Commonly known as: FOSAMAX Take 70 mg by mouth once a week.   Dexcom G7 Sensor Misc by Does not apply route as directed.   Dexcom G6 Sensor Misc APPLY 1 SENSOR TO SKIN EVERY 10 DAYS   Dexcom G6 Transmitter Misc Change every 90 day   gabapentin 300 MG capsule Commonly known as: NEURONTIN Take 300 mg by mouth at bedtime.   Gvoke HypoPen 2-Pack 1 MG/0.2ML Soaj Generic drug: Glucagon Inject 1 pen  into the skin as directed.   Insulin  Pen Needle 32G X 4 MM Misc 1 Device by Does not apply route in the morning, at noon, in the evening, and at bedtime.   UltiCare Mini Pen Needles 31G X 6 MM Misc Generic drug: Insulin  Pen Needle USE AS DIRECTED FOUR TIMES DAILY   Lancets Misc Use as instructed   lisinopril  10 MG tablet Commonly known as: ZESTRIL  Take 1 tablet (10 mg total) by mouth daily.   meloxicam  15 MG tablet Commonly known as: MOBIC  Take 15 mg by mouth daily as needed for pain.   NovoLOG  100 UNIT/ML injection Generic drug: insulin  aspart USE AS DIRECTED (20 UNITS MAX DAILY) AS NEEDED   Omnipod 5 DexG7G6 Pods Gen 5 Misc APPLY 1 POD AS DIRECTED AND REPLACE POD EVERY 72 HOURS.   OneTouch Verio test strip Generic drug: glucose blood Use as instructed   OVER THE COUNTER MEDICATION Take by mouth daily. Patient reports that she takes  ADEK vitamin   pantoprazole  40 MG tablet Commonly known as: PROTONIX  Take 1 tablet by mouth daily.    potassium chloride  20 MEQ packet Commonly known as: KLOR-CON  Take 20 mEq by mouth daily.   potassium chloride  SA 20 MEQ tablet Commonly known as: KLOR-CON  M TAKE 1 TABLET BY MOUTH DAILY.   Sodium Fluoride 5000 PPM 1.1 % Gel dental gel Generic drug: sodium fluoride Place 1 Application onto teeth as directed.   traMADol  50 MG tablet Commonly known as: ULTRAM  Take 50 mg by mouth every 6 (six) hours as needed.         Discharge Exam: Filed Weights   08/22/23 2104  Weight: 54.4 kg    GENERAL:  Alert, pleasant, no acute distress  HEENT:  EOMI CARDIOVASCULAR:  RRR, no murmurs appreciated RESPIRATORY:  Clear to auscultation, no wheezing, rales, or rhonchi  GASTROINTESTINAL:  Soft, nontender, nondistended EXTREMITIES:  No LE edema bilaterally NEURO:  No new focal deficits appreciated SKIN:  No rashes noted PSYCH:  Appropriate mood and affect     Condition at discharge: good  The results of significant diagnostics from this hospitalization (including imaging, microbiology, ancillary and laboratory) are listed below for reference.   Imaging Studies: No results found.  Microbiology: Results for orders placed or performed in visit on 10/15/18  Novel Coronavirus, NAA ([facility])     Status: Abnormal   Collection Time: 10/15/18 12:00 AM   Specimen: Oropharyngeal(OP) collection in vial transport medium   OROPHARYNGEA  TESTING  Result Value Ref Range Status   SARS-CoV-2, NAA Detected (A) Not Detected Final    Comment: Testing was performed using the cobas(R) SARS-CoV-2 test. This nucleic acid amplification test was developed and its performance characteristics determined by [employer]. Nucleic acid amplification tests include PCR and TMA. This test has not been FDA cleared or approved. This test has been authorized by FDA under an Emergency Use Authorization (EUA). This test is only authorized for the duration of time the declaration that circumstances  exist justifying the authorization of the emergency use of in vitro diagnostic tests for detection of SARS-CoV-2 virus and/or diagnosis of COVID-19 infection under section 564(b)(1) of the Act, 21 U.S.C. 639aaa-6(a) (1), unless the authorization is terminated or revoked sooner. When diagnostic testing is negative, the possibility of a false negative result should be considered in the context of a patient's recent exposures and the presence of clinical signs and symptoms consistent with COVID-19. An individual without symptoms  of COVID-19 and who is not shedding SARS-CoV-2 virus would expect to have a negative (not detected) result in this assay.     Labs: CBC: Recent Labs  Lab 08/22/23 2115  WBC 8.8  HGB 14.5  HCT 43.3  MCV 84.9  PLT 302   Basic Metabolic Panel: Recent Labs  Lab 08/22/23 2115 08/23/23 0031 08/23/23 0344 08/23/23 0717  NA 131* 132* 136 137  K 4.6 3.2* 4.4 4.5  CL 91* 102 104 100  CO2 21* 21* 28 25  GLUCOSE 620* 340* 192* 147*  BUN 22* 19 17 15   CREATININE 0.86 0.76 0.57 0.61  CALCIUM  9.1 8.1* 8.0* 8.2*   Liver Function Tests: Recent Labs  Lab 08/22/23 2115  AST 23  ALT 22  ALKPHOS 191*  BILITOT 1.7*  PROT 7.6  ALBUMIN 4.3   CBG: Recent Labs  Lab 08/23/23 0239 08/23/23 0340 08/23/23 0445 08/23/23 0616 08/23/23 0705  GLUCAP 186* 133* 133* 150* 142*    Discharge time spent: 25 minutes.  Length of inpatient stay: 0 days  Signed: Carliss LELON Canales, DO [facility] 08/23/2023

## 2023-08-25 ENCOUNTER — Ambulatory Visit
Admission: RE | Admit: 2023-08-25 | Discharge: 2023-08-25 | Disposition: A | Discharge status: Home / Self Care | Source: Ambulatory Visit | Attending: Physician Assistant | Admitting: Physician Assistant

## 2023-08-25 DIAGNOSIS — Z1231 Encounter for screening mammogram for malignant neoplasm of breast: Secondary | ICD-10-CM | POA: Diagnosis not present

## 2023-08-29 ENCOUNTER — Other Ambulatory Visit: Payer: Self-pay

## 2023-08-29 DIAGNOSIS — Z9884 Bariatric surgery status: Secondary | ICD-10-CM

## 2023-08-29 NOTE — Progress Notes (Signed)
 Pt completed all labs  for outside provider.

## 2023-09-04 NOTE — Addendum Note (Signed)
 Addended by: CINDIE SMILES F on: 09/04/2023 09:00 AM   Modules accepted: Orders

## 2023-09-06 ENCOUNTER — Encounter: Payer: Self-pay | Admitting: Nurse Practitioner

## 2023-09-06 DIAGNOSIS — Z9884 Bariatric surgery status: Secondary | ICD-10-CM | POA: Diagnosis not present

## 2023-09-06 LAB — CMP12+LP+TP+TSH+6AC+CBC/D/PLT
ALT: 16 IU/L (ref 0–32)
AST: 20 IU/L (ref 0–40)
Albumin: 3.7 g/dL — ABNORMAL LOW (ref 3.8–4.9)
Alkaline Phosphatase: 161 IU/L — ABNORMAL HIGH (ref 44–121)
BUN/Creatinine Ratio: 14 (ref 9–23)
BUN: 8 mg/dL (ref 6–24)
Basophils Absolute: 0.1 x10E3/uL (ref 0.0–0.2)
Basos: 1 %
Bilirubin Total: 0.4 mg/dL (ref 0.0–1.2)
Calcium: 8.6 mg/dL — ABNORMAL LOW (ref 8.7–10.2)
Chloride: 104 mmol/L (ref 96–106)
Chol/HDL Ratio: 2.3 ratio (ref 0.0–4.4)
Cholesterol, Total: 127 mg/dL (ref 100–199)
Creatinine, Ser: 0.59 mg/dL (ref 0.57–1.00)
EOS (ABSOLUTE): 0.1 x10E3/uL (ref 0.0–0.4)
Eos: 2 %
Estimated CHD Risk: <0.5 times avg. (ref 0.0–1.0)
Free Thyroxine Index: 1.9 (ref 1.2–4.9)
GGT: 5 IU/L (ref 0–60)
Globulin, Total: 2.1 g/dL (ref 1.5–4.5)
Glucose: 54 mg/dL — ABNORMAL LOW (ref 70–99)
HDL: 56 mg/dL (ref >39)
Hematocrit: 39.3 % (ref 34.0–46.6)
Hemoglobin: 12.4 g/dL (ref 11.1–15.9)
Immature Grans (Abs): 0 x10E3/uL (ref 0.0–0.1)
Immature Granulocytes: 0 %
Iron: 39 ug/dL (ref 27–159)
LDH: 190 IU/L (ref 119–226)
LDL Chol Calc (NIH): 61 mg/dL (ref 0–99)
Lymphocytes Absolute: 2.7 x10E3/uL (ref 0.7–3.1)
Lymphs: 37 %
MCH: 27.7 pg (ref 26.6–33.0)
MCHC: 31.6 g/dL (ref 31.5–35.7)
MCV: 88 fL (ref 79–97)
Monocytes Absolute: 0.6 x10E3/uL (ref 0.1–0.9)
Monocytes: 8 %
Neutrophils Absolute: 3.8 x10E3/uL (ref 1.4–7.0)
Neutrophils: 52 %
Phosphorus: 5 mg/dL — ABNORMAL HIGH (ref 3.0–4.3)
Platelets: 273 x10E3/uL (ref 150–450)
Potassium: 4.3 mmol/L (ref 3.5–5.2)
RBC: 4.47 x10E6/uL (ref 3.77–5.28)
RDW: 13 % (ref 11.7–15.4)
Sodium: 142 mmol/L (ref 134–144)
T3 Uptake Ratio: 26 % (ref 24–39)
T4, Total: 7.2 ug/dL (ref 4.5–12.0)
TSH: 2.73 u[IU]/mL (ref 0.450–4.500)
Total Protein: 5.8 g/dL — ABNORMAL LOW (ref 6.0–8.5)
Triglycerides: 40 mg/dL (ref 0–149)
Uric Acid: 2 mg/dL — ABNORMAL LOW (ref 3.0–7.2)
VLDL Cholesterol Cal: 10 mg/dL (ref 5–40)
WBC: 7.2 x10E3/uL (ref 3.4–10.8)
eGFR: 105 mL/min/1.73 (ref >59)

## 2023-09-06 LAB — METHYLMALONIC ACID, SERUM: Methylmalonic Acid: 848 nmol/L — ABNORMAL HIGH (ref 0–378)

## 2023-09-06 LAB — ZINC: Zinc: 54 ug/dL (ref 44–115)

## 2023-09-06 LAB — B12 AND FOLATE PANEL
Folate: 10.4 ng/mL (ref >3.0)
Vitamin B-12: 302 pg/mL (ref 232–1245)

## 2023-09-06 LAB — VITAMIN B1: Thiamine: 126.3 nmol/L (ref 66.5–200.0)

## 2023-09-06 LAB — VITAMIN A: Vitamin A: 24 ug/dL (ref 20.1–62.0)

## 2023-09-06 LAB — VITAMIN K1, SERUM

## 2023-09-06 LAB — 7ALPHAC4: 7AlphaC4: 9.6 ng/mL

## 2023-09-06 LAB — VITAMIN E
Vitamin E (Alpha Tocopherol): 6.9 mg/L — ABNORMAL LOW (ref 7.0–25.1)
Vitamin E(Gamma Tocopherol): 0.6 mg/L (ref 0.5–5.5)

## 2023-09-06 LAB — FERRITIN: Ferritin: 13 ng/mL — ABNORMAL LOW (ref 15–150)

## 2023-09-06 LAB — HGB A1C W/O EAG: Hgb A1c MFr Bld: 8.3 % — ABNORMAL HIGH (ref 4.8–5.6)

## 2023-09-06 LAB — MAGNESIUM: Magnesium: 2.2 mg/dL (ref 1.6–2.3)

## 2023-09-06 LAB — PARATHYROID HORMONE, INTACT (NO CA): PTH: 38 pg/mL (ref 15–65)

## 2023-09-06 LAB — VITAMIN D 25 HYDROXY (VIT D DEFICIENCY, FRACTURES): Vit D, 25-Hydroxy: 28.9 ng/mL — ABNORMAL LOW (ref 30.0–100.0)

## 2023-09-06 LAB — COPPER, SERUM: Copper: 99 ug/dL (ref 80–158)

## 2023-09-08 ENCOUNTER — Ambulatory Visit

## 2023-09-08 DIAGNOSIS — E109 Type 1 diabetes mellitus without complications: Secondary | ICD-10-CM | POA: Diagnosis not present

## 2023-09-08 DIAGNOSIS — K219 Gastro-esophageal reflux disease without esophagitis: Secondary | ICD-10-CM | POA: Diagnosis not present

## 2023-09-08 DIAGNOSIS — Z9884 Bariatric surgery status: Secondary | ICD-10-CM | POA: Diagnosis not present

## 2023-09-08 DIAGNOSIS — E538 Deficiency of other specified B group vitamins: Secondary | ICD-10-CM

## 2023-09-08 MED ORDER — CYANOCOBALAMIN 1000 MCG/ML IJ SOLN
1000.0000 ug | INTRAMUSCULAR | Status: AC
  Administered 2023-09-08 – 2023-09-29 (×3): 1000 ug via INTRAMUSCULAR

## 2023-09-08 NOTE — Progress Notes (Signed)
 Pt presents today to complete weekly b12 injections for 4 weeks then once every month per K, Drury,MD

## 2023-09-22 ENCOUNTER — Ambulatory Visit

## 2023-09-22 DIAGNOSIS — E538 Deficiency of other specified B group vitamins: Secondary | ICD-10-CM

## 2023-09-22 NOTE — Progress Notes (Signed)
 Presents to [facility] for 2nd Vitamin B12 administration - missed last week because she was traveling out of [place].  Vitamin B12 prescribed by Dr. Verneda for B12 deficiency.

## 2023-09-29 ENCOUNTER — Ambulatory Visit

## 2023-09-29 DIAGNOSIS — E538 Deficiency of other specified B group vitamins: Secondary | ICD-10-CM

## 2023-10-03 DIAGNOSIS — S83512A Sprain of anterior cruciate ligament of left knee, initial encounter: Secondary | ICD-10-CM | POA: Diagnosis not present

## 2023-10-05 DIAGNOSIS — S82125A Nondisplaced fracture of lateral condyle of left tibia, initial encounter for closed fracture: Secondary | ICD-10-CM | POA: Diagnosis not present

## 2023-10-05 DIAGNOSIS — S83512A Sprain of anterior cruciate ligament of left knee, initial encounter: Secondary | ICD-10-CM | POA: Diagnosis not present

## 2023-10-05 DIAGNOSIS — S83412A Sprain of medial collateral ligament of left knee, initial encounter: Secondary | ICD-10-CM | POA: Diagnosis not present

## 2023-10-05 DIAGNOSIS — M25562 Pain in left knee: Secondary | ICD-10-CM | POA: Diagnosis not present

## 2023-10-10 ENCOUNTER — Ambulatory Visit

## 2023-10-10 DIAGNOSIS — E538 Deficiency of other specified B group vitamins: Secondary | ICD-10-CM

## 2023-10-10 DIAGNOSIS — S83512A Sprain of anterior cruciate ligament of left knee, initial encounter: Secondary | ICD-10-CM | POA: Diagnosis not present

## 2023-10-10 MED ORDER — CYANOCOBALAMIN 1000 MCG/ML IJ SOLN
1000.0000 ug | Freq: Once | INTRAMUSCULAR | Status: AC
  Administered 2023-10-10: 1000 ug via INTRAMUSCULAR

## 2023-10-12 DIAGNOSIS — R748 Abnormal levels of other serum enzymes: Secondary | ICD-10-CM | POA: Diagnosis not present

## 2023-10-12 DIAGNOSIS — K9049 Malabsorption due to intolerance, not elsewhere classified: Secondary | ICD-10-CM | POA: Diagnosis not present

## 2023-10-12 DIAGNOSIS — R195 Other fecal abnormalities: Secondary | ICD-10-CM | POA: Diagnosis not present

## 2023-10-20 ENCOUNTER — Ambulatory Visit: Payer: Self-pay | Admitting: Nurse Practitioner

## 2023-10-25 ENCOUNTER — Ambulatory Visit: Admitting: Nurse Practitioner

## 2023-10-25 DIAGNOSIS — S83512A Sprain of anterior cruciate ligament of left knee, initial encounter: Secondary | ICD-10-CM | POA: Diagnosis not present

## 2023-10-25 DIAGNOSIS — S83422A Sprain of lateral collateral ligament of left knee, initial encounter: Secondary | ICD-10-CM | POA: Diagnosis not present

## 2023-10-25 DIAGNOSIS — S83412D Sprain of medial collateral ligament of left knee, subsequent encounter: Secondary | ICD-10-CM | POA: Diagnosis not present

## 2023-10-25 DIAGNOSIS — M25562 Pain in left knee: Secondary | ICD-10-CM | POA: Diagnosis not present

## 2023-10-25 DIAGNOSIS — S83512D Sprain of anterior cruciate ligament of left knee, subsequent encounter: Secondary | ICD-10-CM | POA: Diagnosis not present

## 2023-10-25 DIAGNOSIS — S82125A Nondisplaced fracture of lateral condyle of left tibia, initial encounter for closed fracture: Secondary | ICD-10-CM | POA: Diagnosis not present

## 2023-10-25 DIAGNOSIS — S83412A Sprain of medial collateral ligament of left knee, initial encounter: Secondary | ICD-10-CM | POA: Diagnosis not present

## 2023-10-25 DIAGNOSIS — S82125D Nondisplaced fracture of lateral condyle of left tibia, subsequent encounter for closed fracture with routine healing: Secondary | ICD-10-CM | POA: Diagnosis not present

## 2023-11-01 DIAGNOSIS — M25662 Stiffness of left knee, not elsewhere classified: Secondary | ICD-10-CM | POA: Diagnosis not present

## 2023-11-01 DIAGNOSIS — M25562 Pain in left knee: Secondary | ICD-10-CM | POA: Diagnosis not present

## 2023-11-02 DIAGNOSIS — E113293 Type 2 diabetes mellitus with mild nonproliferative diabetic retinopathy without macular edema, bilateral: Secondary | ICD-10-CM | POA: Diagnosis not present

## 2023-11-09 DIAGNOSIS — M25562 Pain in left knee: Secondary | ICD-10-CM | POA: Diagnosis not present

## 2023-11-09 DIAGNOSIS — M25662 Stiffness of left knee, not elsewhere classified: Secondary | ICD-10-CM | POA: Diagnosis not present

## 2023-11-15 DIAGNOSIS — M25662 Stiffness of left knee, not elsewhere classified: Secondary | ICD-10-CM | POA: Diagnosis not present

## 2023-11-15 DIAGNOSIS — M25562 Pain in left knee: Secondary | ICD-10-CM | POA: Diagnosis not present

## 2023-11-16 ENCOUNTER — Ambulatory Visit: Admitting: Nurse Practitioner

## 2023-11-16 DIAGNOSIS — Z794 Long term (current) use of insulin: Secondary | ICD-10-CM

## 2023-11-16 DIAGNOSIS — E1065 Type 1 diabetes mellitus with hyperglycemia: Secondary | ICD-10-CM

## 2023-11-17 DIAGNOSIS — M25662 Stiffness of left knee, not elsewhere classified: Secondary | ICD-10-CM | POA: Diagnosis not present

## 2023-11-17 DIAGNOSIS — M25562 Pain in left knee: Secondary | ICD-10-CM | POA: Diagnosis not present

## 2023-11-20 ENCOUNTER — Ambulatory Visit: Admitting: Nurse Practitioner

## 2023-11-20 DIAGNOSIS — Z794 Long term (current) use of insulin: Secondary | ICD-10-CM

## 2023-11-20 DIAGNOSIS — E1065 Type 1 diabetes mellitus with hyperglycemia: Secondary | ICD-10-CM

## 2023-11-22 DIAGNOSIS — M25562 Pain in left knee: Secondary | ICD-10-CM | POA: Diagnosis not present

## 2023-11-22 DIAGNOSIS — M25662 Stiffness of left knee, not elsewhere classified: Secondary | ICD-10-CM | POA: Diagnosis not present

## 2023-11-24 ENCOUNTER — Ambulatory Visit: Payer: Self-pay

## 2023-11-24 DIAGNOSIS — E538 Deficiency of other specified B group vitamins: Secondary | ICD-10-CM

## 2023-11-24 DIAGNOSIS — M25562 Pain in left knee: Secondary | ICD-10-CM | POA: Diagnosis not present

## 2023-11-24 DIAGNOSIS — M25662 Stiffness of left knee, not elsewhere classified: Secondary | ICD-10-CM | POA: Diagnosis not present

## 2023-11-24 MED ORDER — CYANOCOBALAMIN 1000 MCG/ML IJ SOLN
1000.0000 ug | INTRAMUSCULAR | Status: AC
  Administered 2023-11-24 – 2024-01-19 (×3): 1000 ug via INTRAMUSCULAR

## 2023-11-24 NOTE — Progress Notes (Signed)
 PT PRESENTS TODAY FOR MONTH B12 INJECTION FOR 3 MONTHS. FIRST DOSE TODAY 1 ML. PT TOLERATED WELL.

## 2023-11-27 DIAGNOSIS — M25662 Stiffness of left knee, not elsewhere classified: Secondary | ICD-10-CM | POA: Diagnosis not present

## 2023-11-27 DIAGNOSIS — M25562 Pain in left knee: Secondary | ICD-10-CM | POA: Diagnosis not present

## 2023-12-01 DIAGNOSIS — M25662 Stiffness of left knee, not elsewhere classified: Secondary | ICD-10-CM | POA: Diagnosis not present

## 2023-12-01 DIAGNOSIS — M25562 Pain in left knee: Secondary | ICD-10-CM | POA: Diagnosis not present

## 2023-12-04 DIAGNOSIS — M25562 Pain in left knee: Secondary | ICD-10-CM | POA: Diagnosis not present

## 2023-12-04 DIAGNOSIS — M25662 Stiffness of left knee, not elsewhere classified: Secondary | ICD-10-CM | POA: Diagnosis not present

## 2023-12-11 DIAGNOSIS — M25662 Stiffness of left knee, not elsewhere classified: Secondary | ICD-10-CM | POA: Diagnosis not present

## 2023-12-11 DIAGNOSIS — M25562 Pain in left knee: Secondary | ICD-10-CM | POA: Diagnosis not present

## 2023-12-12 DIAGNOSIS — S83412D Sprain of medial collateral ligament of left knee, subsequent encounter: Secondary | ICD-10-CM | POA: Diagnosis not present

## 2023-12-12 DIAGNOSIS — S83512D Sprain of anterior cruciate ligament of left knee, subsequent encounter: Secondary | ICD-10-CM | POA: Diagnosis not present

## 2023-12-12 DIAGNOSIS — S838X2D Sprain of other specified parts of left knee, subsequent encounter: Secondary | ICD-10-CM | POA: Diagnosis not present

## 2023-12-22 ENCOUNTER — Ambulatory Visit: Payer: Self-pay

## 2023-12-22 DIAGNOSIS — E538 Deficiency of other specified B group vitamins: Secondary | ICD-10-CM

## 2023-12-26 DIAGNOSIS — M545 Low back pain, unspecified: Secondary | ICD-10-CM | POA: Diagnosis not present

## 2023-12-26 DIAGNOSIS — M9903 Segmental and somatic dysfunction of lumbar region: Secondary | ICD-10-CM | POA: Diagnosis not present

## 2023-12-26 DIAGNOSIS — M9902 Segmental and somatic dysfunction of thoracic region: Secondary | ICD-10-CM | POA: Diagnosis not present

## 2023-12-26 DIAGNOSIS — M9901 Segmental and somatic dysfunction of cervical region: Secondary | ICD-10-CM | POA: Diagnosis not present

## 2024-01-01 DIAGNOSIS — M9901 Segmental and somatic dysfunction of cervical region: Secondary | ICD-10-CM | POA: Diagnosis not present

## 2024-01-01 DIAGNOSIS — M545 Low back pain, unspecified: Secondary | ICD-10-CM | POA: Diagnosis not present

## 2024-01-01 DIAGNOSIS — M9902 Segmental and somatic dysfunction of thoracic region: Secondary | ICD-10-CM | POA: Diagnosis not present

## 2024-01-01 DIAGNOSIS — M9903 Segmental and somatic dysfunction of lumbar region: Secondary | ICD-10-CM | POA: Diagnosis not present

## 2024-01-04 DIAGNOSIS — M9901 Segmental and somatic dysfunction of cervical region: Secondary | ICD-10-CM | POA: Diagnosis not present

## 2024-01-04 DIAGNOSIS — M9903 Segmental and somatic dysfunction of lumbar region: Secondary | ICD-10-CM | POA: Diagnosis not present

## 2024-01-04 DIAGNOSIS — M545 Low back pain, unspecified: Secondary | ICD-10-CM | POA: Diagnosis not present

## 2024-01-04 DIAGNOSIS — M9902 Segmental and somatic dysfunction of thoracic region: Secondary | ICD-10-CM | POA: Diagnosis not present

## 2024-01-08 DIAGNOSIS — M545 Low back pain, unspecified: Secondary | ICD-10-CM | POA: Diagnosis not present

## 2024-01-08 DIAGNOSIS — M9903 Segmental and somatic dysfunction of lumbar region: Secondary | ICD-10-CM | POA: Diagnosis not present

## 2024-01-08 DIAGNOSIS — M9902 Segmental and somatic dysfunction of thoracic region: Secondary | ICD-10-CM | POA: Diagnosis not present

## 2024-01-08 DIAGNOSIS — M9901 Segmental and somatic dysfunction of cervical region: Secondary | ICD-10-CM | POA: Diagnosis not present

## 2024-01-11 DIAGNOSIS — M9903 Segmental and somatic dysfunction of lumbar region: Secondary | ICD-10-CM | POA: Diagnosis not present

## 2024-01-11 DIAGNOSIS — M545 Low back pain, unspecified: Secondary | ICD-10-CM | POA: Diagnosis not present

## 2024-01-11 DIAGNOSIS — M9901 Segmental and somatic dysfunction of cervical region: Secondary | ICD-10-CM | POA: Diagnosis not present

## 2024-01-11 DIAGNOSIS — M9902 Segmental and somatic dysfunction of thoracic region: Secondary | ICD-10-CM | POA: Diagnosis not present

## 2024-01-15 DIAGNOSIS — M9901 Segmental and somatic dysfunction of cervical region: Secondary | ICD-10-CM | POA: Diagnosis not present

## 2024-01-15 DIAGNOSIS — M545 Low back pain, unspecified: Secondary | ICD-10-CM | POA: Diagnosis not present

## 2024-01-15 DIAGNOSIS — M9903 Segmental and somatic dysfunction of lumbar region: Secondary | ICD-10-CM | POA: Diagnosis not present

## 2024-01-15 DIAGNOSIS — M9902 Segmental and somatic dysfunction of thoracic region: Secondary | ICD-10-CM | POA: Diagnosis not present

## 2024-01-17 DIAGNOSIS — M810 Age-related osteoporosis without current pathological fracture: Secondary | ICD-10-CM | POA: Diagnosis not present

## 2024-01-18 DIAGNOSIS — M9902 Segmental and somatic dysfunction of thoracic region: Secondary | ICD-10-CM | POA: Diagnosis not present

## 2024-01-18 DIAGNOSIS — M9903 Segmental and somatic dysfunction of lumbar region: Secondary | ICD-10-CM | POA: Diagnosis not present

## 2024-01-18 DIAGNOSIS — M9901 Segmental and somatic dysfunction of cervical region: Secondary | ICD-10-CM | POA: Diagnosis not present

## 2024-01-18 DIAGNOSIS — M545 Low back pain, unspecified: Secondary | ICD-10-CM | POA: Diagnosis not present

## 2024-01-19 ENCOUNTER — Ambulatory Visit: Payer: Self-pay

## 2024-01-19 DIAGNOSIS — E538 Deficiency of other specified B group vitamins: Secondary | ICD-10-CM

## 2024-01-22 DIAGNOSIS — M9903 Segmental and somatic dysfunction of lumbar region: Secondary | ICD-10-CM | POA: Diagnosis not present

## 2024-01-22 DIAGNOSIS — M9902 Segmental and somatic dysfunction of thoracic region: Secondary | ICD-10-CM | POA: Diagnosis not present

## 2024-01-22 DIAGNOSIS — M9901 Segmental and somatic dysfunction of cervical region: Secondary | ICD-10-CM | POA: Diagnosis not present

## 2024-01-22 DIAGNOSIS — M545 Low back pain, unspecified: Secondary | ICD-10-CM | POA: Diagnosis not present

## 2024-01-29 ENCOUNTER — Ambulatory Visit: Admitting: Nurse Practitioner

## 2024-01-29 DIAGNOSIS — M545 Low back pain, unspecified: Secondary | ICD-10-CM | POA: Insufficient documentation

## 2024-01-29 DIAGNOSIS — M5459 Other low back pain: Secondary | ICD-10-CM | POA: Diagnosis not present

## 2024-02-02 DIAGNOSIS — M5416 Radiculopathy, lumbar region: Secondary | ICD-10-CM | POA: Insufficient documentation

## 2024-02-09 ENCOUNTER — Ambulatory Visit: Payer: Self-pay

## 2024-02-09 DIAGNOSIS — Z0000 Encounter for general adult medical examination without abnormal findings: Secondary | ICD-10-CM

## 2024-02-09 DIAGNOSIS — Z9884 Bariatric surgery status: Secondary | ICD-10-CM

## 2024-02-09 LAB — POCT URINALYSIS DIPSTICK
Bilirubin, UA: NEGATIVE
Blood, UA: NEGATIVE
Glucose, UA: NEGATIVE
Ketones, UA: NEGATIVE
Nitrite, UA: NEGATIVE
Protein, UA: NEGATIVE
Spec Grav, UA: 1.015
Urobilinogen, UA: 0.2 U/dL
pH, UA: 6

## 2024-02-09 NOTE — Progress Notes (Signed)
 Pt completed labs for physical. ?

## 2024-02-10 LAB — MICROALBUMIN / CREATININE URINE RATIO
Creatinine, Urine: 47 mg/dL
Microalb/Creat Ratio: 7 mg/g{creat} (ref 0–29)
Microalbumin, Urine: 3.1 ug/mL

## 2024-02-14 LAB — CMP12+LP+TP+TSH+6AC+CBC/D/PLT
ALT: 13 IU/L (ref 0–32)
AST: 17 IU/L (ref 0–40)
Albumin: 3.8 g/dL (ref 3.8–4.9)
Alkaline Phosphatase: 228 IU/L — ABNORMAL HIGH (ref 49–135)
BUN/Creatinine Ratio: 16 (ref 9–23)
BUN: 9 mg/dL (ref 6–24)
Basophils Absolute: 0.1 x10E3/uL (ref 0.0–0.2)
Basos: 1 %
Bilirubin Total: 0.5 mg/dL (ref 0.0–1.2)
Calcium: 8.9 mg/dL (ref 8.7–10.2)
Chloride: 101 mmol/L (ref 96–106)
Chol/HDL Ratio: 2.8 ratio (ref 0.0–4.4)
Cholesterol, Total: 124 mg/dL (ref 100–199)
Creatinine, Ser: 0.57 mg/dL (ref 0.57–1.00)
EOS (ABSOLUTE): 0.2 x10E3/uL (ref 0.0–0.4)
Eos: 3 %
Free Thyroxine Index: 2 (ref 1.2–4.9)
GGT: 5 IU/L (ref 0–60)
Globulin, Total: 2.3 g/dL (ref 1.5–4.5)
Glucose: 133 mg/dL — ABNORMAL HIGH (ref 70–99)
HDL: 45 mg/dL
Hematocrit: 39.6 % (ref 34.0–46.6)
Hemoglobin: 12.6 g/dL (ref 11.1–15.9)
Immature Grans (Abs): 0 x10E3/uL (ref 0.0–0.1)
Immature Granulocytes: 0 %
Iron: 43 ug/dL (ref 27–159)
LDH: 204 IU/L (ref 119–226)
LDL Chol Calc (NIH): 67 mg/dL (ref 0–99)
Lymphocytes Absolute: 2.4 x10E3/uL (ref 0.7–3.1)
Lymphs: 38 %
MCH: 26.5 pg — ABNORMAL LOW (ref 26.6–33.0)
MCHC: 31.8 g/dL (ref 31.5–35.7)
MCV: 83 fL (ref 79–97)
Monocytes Absolute: 0.5 x10E3/uL (ref 0.1–0.9)
Monocytes: 8 %
Neutrophils Absolute: 3.2 x10E3/uL (ref 1.4–7.0)
Neutrophils: 50 %
Phosphorus: 3.9 mg/dL (ref 3.0–4.3)
Platelets: 309 x10E3/uL (ref 150–450)
Potassium: 4.2 mmol/L (ref 3.5–5.2)
RBC: 4.75 x10E6/uL (ref 3.77–5.28)
RDW: 13.5 % (ref 11.7–15.4)
Sodium: 137 mmol/L (ref 134–144)
T3 Uptake Ratio: 26 % (ref 24–39)
T4, Total: 7.8 ug/dL (ref 4.5–12.0)
TSH: 2.5 u[IU]/mL (ref 0.450–4.500)
Total Protein: 6.1 g/dL (ref 6.0–8.5)
Triglycerides: 55 mg/dL (ref 0–149)
Uric Acid: 1.6 mg/dL — ABNORMAL LOW (ref 3.0–7.2)
VLDL Cholesterol Cal: 12 mg/dL (ref 5–40)
WBC: 6.4 x10E3/uL (ref 3.4–10.8)
eGFR: 106 mL/min/1.73

## 2024-02-14 LAB — VITAMIN E
Vitamin E (Alpha Tocopherol): 4.7 mg/L — ABNORMAL LOW (ref 7.0–25.1)
Vitamin E(Gamma Tocopherol): 0.7 mg/L (ref 0.5–5.5)

## 2024-02-14 LAB — METHYLMALONIC ACID, SERUM: Methylmalonic Acid: 341 nmol/L (ref 0–378)

## 2024-02-14 LAB — B12 AND FOLATE PANEL
Folate: 12.8 ng/mL
Vitamin B-12: 768 pg/mL (ref 232–1245)

## 2024-02-14 LAB — HGB A1C W/O EAG: Hgb A1c MFr Bld: 10.1 % — ABNORMAL HIGH (ref 4.8–5.6)

## 2024-02-14 LAB — VITAMIN D 25 HYDROXY (VIT D DEFICIENCY, FRACTURES): Vit D, 25-Hydroxy: 12.7 ng/mL — ABNORMAL LOW (ref 30.0–100.0)

## 2024-02-14 LAB — FERRITIN: Ferritin: 13 ng/mL — ABNORMAL LOW (ref 15–150)

## 2024-02-15 ENCOUNTER — Ambulatory Visit: Payer: Self-pay | Admitting: Physician Assistant

## 2024-02-15 ENCOUNTER — Encounter: Payer: Self-pay | Admitting: Physician Assistant

## 2024-02-15 VITALS — BP 174/93 | HR 82 | Resp 16 | Ht 61.0 in | Wt 122.0 lb

## 2024-02-15 DIAGNOSIS — Z0000 Encounter for general adult medical examination without abnormal findings: Secondary | ICD-10-CM

## 2024-02-15 NOTE — Progress Notes (Signed)
 Pt presents today to complete physical, pt did not voice any concerns at this time. Lori Carr

## 2024-02-15 NOTE — Progress Notes (Signed)
 "  [facility] ____________________________________________   None    (approximate)  I have reviewed the triage vital signs and the nursing notes.   HISTORY  Chief Complaint No chief complaint on file.   HPI Lori Carr is a 59 y.o. 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  Patient admits to noncompliance of hypertension medication.  Patient will be seeing endocrinologist next week for uncontrolled diabetes.  Patient also has a history of vitamin deficiencies secondary to bariatric surgery.         Past Medical History:  Diagnosis Date   chronic vaginal bleeding    Diabetes mellitus    type 2   DKA (diabetic ketoacidoses)    History of Elevated LDL cholesterol level    Obesity     Patient Active Problem List   Diagnosis Date Noted   Lumbar radiculopathy 02/02/2024   Low back pain 01/29/2024   Arthralgia of left knee 11/01/2023   Stiffness of left knee 11/01/2023   Sprain of anterior cruciate ligament of left knee 10/03/2023   Rib pain on left side 08/23/2023   Nausea & vomiting 08/23/2023   DKA (diabetic ketoacidosis) (HCC) 08/22/2023   HTN (hypertension) 08/22/2023   GERD (gastroesophageal reflux disease) 08/22/2023   Syncope 08/22/2023   Bradycardia 08/22/2023   Stress fracture of right foot 06/06/2022   History of bariatric surgery 06/06/2022   Osteoporosis 01/19/2022   Vitamin E deficiency 03/04/2021   Encounter for counseling 02/05/2021   Closed fracture of proximal phalanx of great toe 08/17/2020   Pain of toe of left foot 08/17/2020   Nondisplaced fracture of proximal phalanx of left lesser toe(s), initial encounter for closed fracture 08/17/2020   Anxiety as acute reaction to exceptional stress 09/26/2019   Sinus tachycardia 09/26/2019   Status post bariatric surgery 08/06/2019   Pre-op evaluation 05/23/2019   Radial styloid tenosynovitis of right hand  03/27/2019   Gastritis without bleeding 02/11/2019   Sprain of knee 02/11/2019   Hand pain 09/20/2018   Neuropathy 09/03/2018   Type 1 diabetes mellitus with hyperglycemia (HCC) 08/31/2018   Diabetic ketoacidosis (HCC) 06/06/2018   AKI (acute kidney injury) 06/06/2018   Postmenopausal bleeding 08/16/2017   BMI 40.0-44.9, adult (HCC) 06/07/2017   Morbid obesity (HCC) 06/07/2017   Snoring 04/27/2017   Type 1 diabetes mellitus without complication 07/31/2014   Osteoarthritis of knee 07/22/2014   Dupuytren's contracture of foot 06/05/2013    Past Surgical History:  Procedure Laterality Date   BREAST BIOPSY Right 09/02/2022   MM RT BREAST BX W LOC DEV 1ST LESION IMAGE BX SPEC STEREO GUIDE 09/02/2022 [facility]   CESAREAN SECTION     x3   COLONOSCOPY  2018   COLONOSCOPY WITH PROPOFOL  N/A 06/14/2022   Procedure: COLONOSCOPY WITH PROPOFOL ;  Surgeon: Maryruth Ole DASEN, MD;  Location: [facility];  Service: Endoscopy;  Laterality: N/A;   DILITATION & CURRETTAGE/HYSTROSCOPY WITH NOVASURE ABLATION N/A 07/03/2018   Procedure: DILATATION & CURETTAGE/HYSTEROSCOPY WITH Possible NOVASURE ABLATION;  Surgeon: Gorge Ade, MD;  Location: [facility];  Service: Gynecology;  Laterality: N/A;   LAPAROSCOPIC GASTRIC RESTRICTIVE DUODENAL PROCEDURE (DUODENAL SWITCH) N/A 07/11/2019   tissue removed from stomach  as child   TYMPANOSTOMY TUBE PLACEMENT  as child   WRIST SURGERY Right 03/29/2019    Prior to Admission medications  Medication Sig Start Date End Date Taking? Authorizing Provider  alendronate (FOSAMAX) 70 MG tablet Take 70  mg by mouth once a week.    [provider]  Continuous Blood Gluc Sensor (DEXCOM G6 SENSOR) MISC APPLY 1 SENSOR TO SKIN EVERY 10 DAYS Patient not taking: No sig reported 03/06/22   Shamleffer, Donell Cardinal, MD  Continuous Blood Gluc Transmit (DEXCOM G6 TRANSMITTER) MISC Change every 90 day Patient not taking: No sig reported 04/19/22    Shamleffer, Donell Cardinal, MD  Continuous Glucose Sensor (DEXCOM G7 SENSOR) MISC by Does not apply route as directed.    [provider]  cyanocobalamin  (VITAMIN B12) 1000 MCG/ML injection INJECT 1 MILLILITER INTO THE MUSCLE WEEKLY FOR 4 WEEKS THEN INJECT 1 MILLILITER MONTHLY 11/07/23   [provider]  gabapentin (NEURONTIN) 300 MG capsule Take 300 mg by mouth at bedtime. 08/18/23   [provider]  glucose blood (ONETOUCH VERIO) test strip Use as instructed 07/30/19   Claudene Tanda POUR, PA-C  GVOKE HYPOPEN 2-PACK 1 MG/0.2ML SOAJ Inject 1 pen  into the skin as directed. 04/06/23   [provider]  insulin  aspart (NOVOLOG ) 100 UNIT/ML injection USE AS DIRECTED (20 UNITS MAX DAILY) AS NEEDED 05/08/22   Shamleffer, Ibtehal Jaralla, MD  Insulin  Disposable Pump (OMNIPOD 5 G6 PODS, GEN 5,) MISC APPLY 1 POD AS DIRECTED AND REPLACE POD EVERY 72 HOURS. 07/21/22   Shamleffer, Ibtehal Jaralla, MD  Insulin  Pen Needle 32G X 4 MM MISC 1 Device by Does not apply route in the morning, at noon, in the evening, and at bedtime. 07/23/19   Shamleffer, Ibtehal Jaralla, MD  Lancets MISC Use as instructed 07/29/19   Saunders Shona CROME, PA-C  lisinopril  (ZESTRIL ) 10 MG tablet Take 1 tablet (10 mg total) by mouth daily. 11/10/20   Claudene Tanda POUR, PA-C  meloxicam  (MOBIC ) 15 MG tablet Take 15 mg by mouth daily as needed for pain. 04/17/23   [provider]  OVER THE COUNTER MEDICATION Take by mouth daily. Patient reports that she takes  ADEK vitamin    [provider]  pantoprazole  (PROTONIX ) 40 MG tablet Take 1 tablet by mouth daily. 03/23/20   [provider]  potassium chloride  (KLOR-CON ) 20 MEQ packet Take 20 mEq by mouth daily. 08/23/23   Arlon Carliss ORN, DO  potassium chloride  SA (KLOR-CON  M) 20 MEQ tablet TAKE 1 TABLET BY MOUTH DAILY. 06/20/23   Claudene Tanda POUR, PA-C  SODIUM FLUORIDE 5000 PPM 1.1 % GEL dental gel Place 1 Application onto teeth as directed. 07/26/23    [provider]  traMADol  (ULTRAM ) 50 MG tablet Take 50 mg by mouth every 6 (six) hours as needed.    [provider]  ULTICARE MINI PEN NEEDLES 31G X 6 MM MISC USE AS DIRECTED FOUR TIMES DAILY 05/16/20   Shamleffer, Ibtehal Jaralla, MD    Allergies Amoxicillin-pot clavulanate, Hydrocodone -acetaminophen , and Vicodin [hydrocodone -acetaminophen ]  Family History  Problem Relation Age of Onset   Ovarian cancer Mother 105       deceased age 58 stage IV ovarian cancer   Breast cancer Maternal Aunt 82   Breast cancer Paternal Grandmother 47   Breast cancer Paternal Aunt 8   Stroke Brother    Diabetes Mellitus I Brother     Social History Social History[1]  Review of Systems Constitutional: No fever/chills Eyes: No visual changes. ENT: No sore throat. Cardiovascular: Denies chest pain. Respiratory: Denies shortness of breath. Gastrointestinal: No abdominal pain.  No nausea, no vomiting.  No diarrhea.  No constipation.  Status post bariatric surgery Genitourinary: Negative for dysuria. Musculoskeletal: Negative  for back pain. Skin: Negative for rash. Neurological: Negative for headaches, focal weakness or numbness. Psychiatric: Anxiety Endocrine: Diabetes, hypertension, and vitamin deficiency. Allergic/Immunilogical: Augmentin, hydrocodone , and Bactrim . ____________________________________________   PHYSICAL EXAM:  VITAL SIGNS: BP 174/93  Cuff Size Normal  Pulse Rate 82  Weight 122 lb (55.3 kg)  Height 5' 1 (1.549 m)  Resp 16   BMI: 23.05 kg/m2  BSA: 1.54 m2   Constitutional: Alert and oriented. Well appearing and in no acute distress. Eyes: Conjunctivae are normal. PERRL. EOMI. Head: Atraumatic. Nose: No congestion/rhinnorhea. Mouth/Throat: Mucous membranes are moist.  Oropharynx non-erythematous. Neck: No stridor.  No cervical spine tenderness to palpation. Hematological/Lymphatic/Immunilogical: No cervical lymphadenopathy. Cardiovascular: Normal  rate, regular rhythm. Grossly normal heart sounds.  Good peripheral circulation. Respiratory: Normal respiratory effort.  No retractions. Lungs CTAB. Gastrointestinal: Soft and nontender. No distention. No abdominal bruits. No CVA tenderness. Genitourinary: Deferred Musculoskeletal: No lower extremity tenderness nor edema.  No joint effusions. Neurologic:  Normal speech and language. No gross focal neurologic deficits are appreciated. No gait instability. Skin:  Skin is warm, dry and intact. No rash noted. Psychiatric: Mood and affect are normal. Speech and behavior are normal.  ____________________________________________   LABS           Component Ref Range & Units (hover) 6 d ago (02/09/24) 5 mo ago (08/22/23) 10 mo ago (04/11/23) 11 mo ago (02/24/23) 1 yr ago (04/25/22) 1 yr ago (04/22/22) 2 yr ago (05/24/21)  Color, UA yellow  yellow Amber yellow dark yellow Yellow  Clarity, UA clear  clear Clear clear cloudy Clear  Glucose, UA Negative  Positive Abnormal  CM Positive Abnormal  CM Positive Abnormal  CM Positive Abnormal  CM Negative  Bilirubin, UA neg  neg Negative neg 1+ Abnormal  Negative  Ketones, UA neg  trace -+ Negative neg neg Negative  Spec Grav, UA 1.015  1.015 1.025 >=1.030 Abnormal  >=1.030 Abnormal  1.025  Blood, UA neg  neg Negative neg neg Negative  pH, UA 6.0  6.0 6.0 5.5 6.0 6.0  Protein, UA Negative  Negative Positive Abnormal  CM Negative Positive Abnormal  CM Negative  Urobilinogen, UA 0.2  0.2 0.2 0.2 1.0 0.2  Nitrite, UA neg  neg Negative neg neg Negative  Leukocytes, UA Trace Abnormal   Negative Negative Negative Trace Abnormal  Negative  Appearance  CLEAR Abnormal  R   dark    Odor         Resulting Agency  [facility]              Component Ref Range & Units (hover) 6 d ago 5 mo ago  Methylmalonic Acid 341 848 High             B12 and Folate Panel Order: [id number]  Status: Final result     Next appt: None     Dx: History of bariatric surgery      Test Result Released: Yes (not seen)   0 Result Notes       Component Ref Range & Units (hover) 6 d ago 5 mo ago 1 yr ago 4 yr ago  Vitamin B-12 768 302 392 625        Component Ref Range & Units (hover) 6 d ago 5 mo ago 4 yr ago  Ferritin 13 Low  13 Low  90                       Ref  Range & Units (hover) 6 d ago 5 mo ago 1 yr ago 4 yr ago  Vitamin E (Alpha Tocopherol) 4.7 Low  6.9 Low  6.0 Low  8.0  Vitamin E(Gamma Tocopherol) 0.7 0.6 CM 0.5 CM 1.0 CM  Comment: Reference intervals for alpha and gamma-tocopherol determined from [facility] and Nutrition Examination Survey, 2005-2006. Individuals with alpha-tocopherol levels less than 5.0 mg/L are considered vitamin E deficient.          Ref Range & Units (hover) 6 d ago (02/09/24) 5 mo ago (08/29/23) 5 mo ago (08/23/23) 5 mo ago (08/23/23) 5 mo ago (08/23/23) 5 mo ago (08/22/23) 5 mo ago (08/22/23)  Glucose 133 High  54 Low  147 High  CM 192 High  CM 340 High  CM 620 High Panic  CM   Uric Acid 1.6 Low  2.0 Low  CM       Comment:            Therapeutic target for gout patients: <6.0  BUN 9 8 15  R 17 R 19 R 22 High  R   Creatinine, Ser 0.57 0.59 0.61 R 0.57 R 0.76 R 0.86 R   eGFR 106 105       BUN/Creatinine Ratio 16 14       Sodium 137 142 137 R 136 R 132 Low  R 131 Low  R   Potassium 4.2 4.3 4.5 R 4.4 R 3.2 Low  R 4.6 R   Chloride 101 104 100 R 104 R 102 R 91 Low  R   Calcium  8.9 8.6 Low  8.2 Low  R 8.0 Low  R 8.1 Low  R 9.1 R   Phosphorus 3.9 5.0 High        Total Protein 6.1 5.8 Low     7.6 R   Albumin 3.8 3.7 Low     4.3 R   Globulin, Total 2.3 2.1       Bilirubin Total 0.5 0.4    1.7 High    Alkaline Phosphatase 228 High  161 High  R    191 High  R   LDH 204 190       AST 17 20    23  R   ALT 13 16    22  R   GGT 5 5       Iron 43 39       Cholesterol, Total 124 127       Triglycerides 55 40       HDL 45 56       VLDL Cholesterol Cal 12 10       LDL Chol Calc (NIH) 67 61       Chol/HDL Ratio 2.8 2.3  CM       Comment:                                   T. Chol/HDL Ratio                                             Men  Women                               1/2 Avg.Risk  3.4    3.3  Avg.Risk  5.0    4.4                                2X Avg.Risk  9.6    7.1                                3X Avg.Risk 23.4   11.0  Estimated CHD Risk  < 0.5  < 0.5 CM       Comment: The CHD Risk is based on the T. Chol/HDL ratio. Other factors affect CHD Risk such as hypertension, smoking, diabetes, severe obesity, and family history of premature CHD.  TSH 2.500 2.730       T4, Total 7.8 7.2       T3 Uptake Ratio 26 26       Free Thyroxine Index 2.0 1.9       WBC 6.4 7.2     8.8 R  RBC 4.75 4.47     5.10 R  Hemoglobin 12.6 12.4     14.5 R  Hematocrit 39.6 39.3     43.3 R  MCV 83 88     84.9 R  MCH 26.5 Low  27.7     28.4 R  MCHC 31.8 31.6     33.5 R  RDW 13.5 13.0     12.8 R  Platelets 309 273     302 R  Neutrophils 50 52       Lymphs 38 37       Monocytes 8 8       Eos 3 2       Basos 1 1       Neutrophils Absolute 3.2 3.8       Lymphocytes Absolute 2.4 2.7       Monocytes Absolute 0.5 0.6       EOS (ABSOLUTE) 0.2 0.1       Basophils Absolute 0.1 0.1       Immature Granulocytes 0 0       Immature Grans (Abs) 0.0 0.0                     _______________________________  EKG  Sinus rhythm at 67 bpm.  Voltage criteria consistent with LVH. ____________________________________________    ____________________________________________   INITIAL IMPRESSION / ASSESSMENT AND PLAN  As part of my medical decision making, I reviewed the following data within the electronic MEDICAL RECORD NUMBER      No acute findings on physical exam and EKG.  Patient labs continue to show elevated hemoglobin A1c and vitamin deficiency.  Patient will follow-up with hematology on schedule visit next week.  Patient states she will become compliant with her blood pressure medication.         ____________________________________________   FINAL CLINICAL IMPRESSION  Annual physical exam  ED Discharge Orders     None        Note:  This document was prepared using Dragon voice recognition software and may include unintentional dictation errors.     [1]  Social History Tobacco Use   Smoking status: Never   Smokeless tobacco: Never  Vaping Use   Vaping status: Never Used  Substance Use Topics   Alcohol use: No   Drug use: No   "
